# Patient Record
Sex: Female | Born: 1960 | Race: Black or African American | Hispanic: No | Marital: Married | Smoking: Former smoker
Health system: Southern US, Community
[De-identification: ages and names within clinical notes are randomized; demographics above are authoritative.]

## PROBLEM LIST (undated history)

## (undated) ENCOUNTER — Inpatient Hospital Stay (HOSPITAL_COMMUNITY): Payer: Self-pay

## (undated) DIAGNOSIS — N926 Irregular menstruation, unspecified: Secondary | ICD-10-CM

## (undated) DIAGNOSIS — I82409 Acute embolism and thrombosis of unspecified deep veins of unspecified lower extremity: Secondary | ICD-10-CM

## (undated) DIAGNOSIS — N939 Abnormal uterine and vaginal bleeding, unspecified: Secondary | ICD-10-CM

## (undated) DIAGNOSIS — I2699 Other pulmonary embolism without acute cor pulmonale: Secondary | ICD-10-CM

## (undated) DIAGNOSIS — C801 Malignant (primary) neoplasm, unspecified: Secondary | ICD-10-CM

## (undated) DIAGNOSIS — T8859XA Other complications of anesthesia, initial encounter: Secondary | ICD-10-CM

## (undated) DIAGNOSIS — D649 Anemia, unspecified: Secondary | ICD-10-CM

## (undated) DIAGNOSIS — R7303 Prediabetes: Secondary | ICD-10-CM

## (undated) DIAGNOSIS — K649 Unspecified hemorrhoids: Secondary | ICD-10-CM

## (undated) DIAGNOSIS — K219 Gastro-esophageal reflux disease without esophagitis: Secondary | ICD-10-CM

## (undated) HISTORY — DX: Irregular menstruation, unspecified: N92.6

## (undated) HISTORY — PX: OTHER SURGICAL HISTORY: SHX169

## (undated) HISTORY — PX: MYOMECTOMY: SHX85

## (undated) HISTORY — PX: TUBAL LIGATION: SHX77

## (undated) HISTORY — DX: Other pulmonary embolism without acute cor pulmonale: I26.99

## (undated) HISTORY — DX: Unspecified hemorrhoids: K64.9

## (undated) HISTORY — DX: Abnormal uterine and vaginal bleeding, unspecified: N93.9

---

## 2001-02-02 ENCOUNTER — Other Ambulatory Visit: Admission: RE | Admit: 2001-02-02 | Discharge: 2001-02-02 | Payer: Self-pay | Admitting: Internal Medicine

## 2001-10-05 ENCOUNTER — Encounter: Payer: Self-pay | Admitting: Internal Medicine

## 2001-10-06 ENCOUNTER — Encounter: Payer: Self-pay | Admitting: Family Medicine

## 2001-10-06 ENCOUNTER — Encounter: Payer: Self-pay | Admitting: Internal Medicine

## 2001-10-06 ENCOUNTER — Inpatient Hospital Stay (HOSPITAL_COMMUNITY): Admission: AD | Admit: 2001-10-06 | Discharge: 2001-10-07 | Payer: Self-pay | Admitting: Family Medicine

## 2003-11-20 ENCOUNTER — Ambulatory Visit (HOSPITAL_COMMUNITY): Admission: RE | Admit: 2003-11-20 | Discharge: 2003-11-20 | Payer: Self-pay | Admitting: Internal Medicine

## 2003-11-22 ENCOUNTER — Encounter (INDEPENDENT_AMBULATORY_CARE_PROVIDER_SITE_OTHER): Payer: Self-pay | Admitting: *Deleted

## 2003-11-22 ENCOUNTER — Emergency Department (HOSPITAL_COMMUNITY): Admission: EM | Admit: 2003-11-22 | Discharge: 2003-11-22 | Payer: Self-pay | Admitting: Emergency Medicine

## 2004-02-20 ENCOUNTER — Encounter: Payer: Self-pay | Admitting: Internal Medicine

## 2004-04-11 ENCOUNTER — Ambulatory Visit: Payer: Self-pay | Admitting: Internal Medicine

## 2004-04-25 ENCOUNTER — Ambulatory Visit: Payer: Self-pay | Admitting: Internal Medicine

## 2004-05-10 ENCOUNTER — Ambulatory Visit: Payer: Self-pay | Admitting: Internal Medicine

## 2004-06-19 ENCOUNTER — Ambulatory Visit: Payer: Self-pay | Admitting: Internal Medicine

## 2004-06-26 ENCOUNTER — Ambulatory Visit: Payer: Self-pay | Admitting: Internal Medicine

## 2004-07-19 ENCOUNTER — Ambulatory Visit: Payer: Self-pay | Admitting: Internal Medicine

## 2004-07-31 ENCOUNTER — Ambulatory Visit: Payer: Self-pay | Admitting: Internal Medicine

## 2004-08-14 ENCOUNTER — Ambulatory Visit: Payer: Self-pay | Admitting: Internal Medicine

## 2004-08-28 ENCOUNTER — Ambulatory Visit: Payer: Self-pay | Admitting: Internal Medicine

## 2004-10-28 ENCOUNTER — Ambulatory Visit: Payer: Self-pay | Admitting: Internal Medicine

## 2004-11-27 ENCOUNTER — Ambulatory Visit: Payer: Self-pay | Admitting: Internal Medicine

## 2004-12-25 ENCOUNTER — Ambulatory Visit: Payer: Self-pay | Admitting: Internal Medicine

## 2005-01-24 ENCOUNTER — Ambulatory Visit: Payer: Self-pay | Admitting: Internal Medicine

## 2005-03-28 ENCOUNTER — Ambulatory Visit: Payer: Self-pay | Admitting: Internal Medicine

## 2005-04-28 ENCOUNTER — Ambulatory Visit: Payer: Self-pay | Admitting: Internal Medicine

## 2005-06-25 ENCOUNTER — Ambulatory Visit: Payer: Self-pay | Admitting: Internal Medicine

## 2005-10-23 ENCOUNTER — Ambulatory Visit: Payer: Self-pay | Admitting: Internal Medicine

## 2005-11-25 ENCOUNTER — Ambulatory Visit: Payer: Self-pay | Admitting: Internal Medicine

## 2005-12-03 ENCOUNTER — Ambulatory Visit: Payer: Self-pay | Admitting: Internal Medicine

## 2005-12-17 ENCOUNTER — Ambulatory Visit: Payer: Self-pay | Admitting: Internal Medicine

## 2006-01-07 ENCOUNTER — Ambulatory Visit: Payer: Self-pay | Admitting: Internal Medicine

## 2006-01-21 ENCOUNTER — Ambulatory Visit: Payer: Self-pay | Admitting: Internal Medicine

## 2006-02-19 ENCOUNTER — Ambulatory Visit: Payer: Self-pay | Admitting: Internal Medicine

## 2006-03-20 ENCOUNTER — Ambulatory Visit: Payer: Self-pay | Admitting: Internal Medicine

## 2006-04-02 ENCOUNTER — Ambulatory Visit: Payer: Self-pay | Admitting: Internal Medicine

## 2006-04-17 ENCOUNTER — Ambulatory Visit: Payer: Self-pay | Admitting: Internal Medicine

## 2006-05-18 ENCOUNTER — Ambulatory Visit: Payer: Self-pay | Admitting: Internal Medicine

## 2006-06-25 ENCOUNTER — Ambulatory Visit: Payer: Self-pay | Admitting: Internal Medicine

## 2006-07-23 ENCOUNTER — Ambulatory Visit: Payer: Self-pay | Admitting: Internal Medicine

## 2006-11-12 ENCOUNTER — Encounter: Payer: Self-pay | Admitting: Internal Medicine

## 2006-11-12 DIAGNOSIS — D6869 Other thrombophilia: Secondary | ICD-10-CM | POA: Insufficient documentation

## 2006-11-12 DIAGNOSIS — Z86718 Personal history of other venous thrombosis and embolism: Secondary | ICD-10-CM

## 2007-03-08 ENCOUNTER — Telehealth: Payer: Self-pay | Admitting: Internal Medicine

## 2007-03-12 ENCOUNTER — Ambulatory Visit: Payer: Self-pay | Admitting: Internal Medicine

## 2007-03-12 LAB — CONVERTED CEMR LAB: Prothrombin Time: 14.3 s

## 2007-03-26 ENCOUNTER — Ambulatory Visit: Payer: Self-pay | Admitting: Internal Medicine

## 2007-03-26 LAB — CONVERTED CEMR LAB
INR: 3.3
Prothrombin Time: 22 s

## 2007-04-19 ENCOUNTER — Ambulatory Visit: Payer: Self-pay | Admitting: Internal Medicine

## 2007-04-19 DIAGNOSIS — N939 Abnormal uterine and vaginal bleeding, unspecified: Secondary | ICD-10-CM

## 2007-04-19 DIAGNOSIS — N926 Irregular menstruation, unspecified: Secondary | ICD-10-CM

## 2007-04-19 HISTORY — DX: Irregular menstruation, unspecified: N92.6

## 2007-04-20 LAB — CONVERTED CEMR LAB
INR: 5.3 — ABNORMAL HIGH (ref 0.8–1.0)
Prothrombin Time: 29.8 s — ABNORMAL HIGH (ref 10.9–13.3)

## 2007-04-21 ENCOUNTER — Ambulatory Visit: Payer: Self-pay | Admitting: Internal Medicine

## 2007-04-21 LAB — CONVERTED CEMR LAB: INR: 3.7

## 2007-04-30 ENCOUNTER — Ambulatory Visit: Payer: Self-pay | Admitting: Internal Medicine

## 2007-04-30 LAB — CONVERTED CEMR LAB: Prothrombin Time: 20.7 s

## 2007-05-03 ENCOUNTER — Encounter: Payer: Self-pay | Admitting: Internal Medicine

## 2007-05-12 ENCOUNTER — Ambulatory Visit: Payer: Self-pay | Admitting: Internal Medicine

## 2007-05-14 ENCOUNTER — Ambulatory Visit: Payer: Self-pay | Admitting: Internal Medicine

## 2007-05-14 LAB — CONVERTED CEMR LAB: INR: 4.2

## 2007-05-24 ENCOUNTER — Ambulatory Visit: Payer: Self-pay | Admitting: Internal Medicine

## 2007-05-24 LAB — CONVERTED CEMR LAB
INR: 1
Prothrombin Time: 12 s

## 2007-05-28 ENCOUNTER — Ambulatory Visit: Payer: Self-pay | Admitting: Internal Medicine

## 2007-05-28 LAB — CONVERTED CEMR LAB: INR: 1.2

## 2007-06-11 ENCOUNTER — Ambulatory Visit: Payer: Self-pay | Admitting: Internal Medicine

## 2007-06-11 LAB — CONVERTED CEMR LAB: INR: 2.8

## 2007-09-15 ENCOUNTER — Ambulatory Visit: Payer: Self-pay | Admitting: Internal Medicine

## 2007-10-04 ENCOUNTER — Ambulatory Visit: Payer: Self-pay | Admitting: Internal Medicine

## 2007-10-04 ENCOUNTER — Ambulatory Visit: Payer: Self-pay

## 2007-10-04 DIAGNOSIS — R609 Edema, unspecified: Secondary | ICD-10-CM

## 2007-10-04 DIAGNOSIS — R635 Abnormal weight gain: Secondary | ICD-10-CM | POA: Insufficient documentation

## 2007-10-04 LAB — CONVERTED CEMR LAB
Basophils Absolute: 0 10*3/uL (ref 0.0–0.1)
Basophils Relative: 0.8 % (ref 0.0–1.0)
Bilirubin Urine: NEGATIVE
Bilirubin, Direct: 0.1 mg/dL (ref 0.0–0.3)
Calcium: 8.7 mg/dL (ref 8.4–10.5)
Cholesterol: 170 mg/dL (ref 0–200)
Creatinine, Ser: 0.8 mg/dL (ref 0.4–1.2)
Eosinophils Absolute: 0.1 10*3/uL (ref 0.0–0.7)
Free T4: 0.8 ng/dL (ref 0.6–1.6)
GFR calc non Af Amer: 82 mL/min
Glucose, Urine, Semiquant: NEGATIVE
LDL Cholesterol: 99 mg/dL (ref 0–99)
MCHC: 33.9 g/dL (ref 30.0–36.0)
MCV: 78.1 fL (ref 78.0–100.0)
Neutro Abs: 2.1 10*3/uL (ref 1.4–7.7)
Neutrophils Relative %: 59.8 % (ref 43.0–77.0)
RBC: 4.68 M/uL (ref 3.87–5.11)
RDW: 13.5 % (ref 11.5–14.6)
Sodium: 141 meq/L (ref 135–145)
TSH: 0.81 microintl units/mL (ref 0.35–5.50)
Total Bilirubin: 0.6 mg/dL (ref 0.3–1.2)
Triglycerides: 136 mg/dL (ref 0–149)
Urobilinogen, UA: 0.2
VLDL: 27 mg/dL (ref 0–40)
pH: 5.5

## 2007-10-07 ENCOUNTER — Ambulatory Visit: Payer: Self-pay | Admitting: Internal Medicine

## 2007-10-07 LAB — CONVERTED CEMR LAB
INR: 2
Prothrombin Time: 17.5 s

## 2007-10-12 ENCOUNTER — Encounter: Payer: Self-pay | Admitting: Internal Medicine

## 2007-10-21 ENCOUNTER — Ambulatory Visit: Payer: Self-pay | Admitting: Internal Medicine

## 2007-10-21 LAB — CONVERTED CEMR LAB

## 2007-11-11 ENCOUNTER — Ambulatory Visit: Payer: Self-pay | Admitting: Internal Medicine

## 2007-11-11 DIAGNOSIS — N946 Dysmenorrhea, unspecified: Secondary | ICD-10-CM

## 2008-01-18 ENCOUNTER — Ambulatory Visit: Payer: Self-pay | Admitting: Internal Medicine

## 2008-08-30 ENCOUNTER — Telehealth: Payer: Self-pay | Admitting: *Deleted

## 2008-08-31 ENCOUNTER — Ambulatory Visit: Payer: Self-pay | Admitting: Internal Medicine

## 2008-09-04 ENCOUNTER — Telehealth: Payer: Self-pay | Admitting: *Deleted

## 2008-09-14 ENCOUNTER — Ambulatory Visit: Payer: Self-pay | Admitting: Internal Medicine

## 2008-09-14 LAB — CONVERTED CEMR LAB
INR: 8
Prothrombin Time: 33.9 s

## 2008-09-28 ENCOUNTER — Ambulatory Visit: Payer: Self-pay | Admitting: Internal Medicine

## 2008-09-28 LAB — CONVERTED CEMR LAB
INR: 2.6
Prothrombin Time: 19.7 s

## 2008-10-26 ENCOUNTER — Ambulatory Visit: Payer: Self-pay | Admitting: Internal Medicine

## 2008-10-26 LAB — CONVERTED CEMR LAB: Prothrombin Time: 22.2 s

## 2008-12-26 ENCOUNTER — Ambulatory Visit: Payer: Self-pay | Admitting: Internal Medicine

## 2008-12-26 DIAGNOSIS — K649 Unspecified hemorrhoids: Secondary | ICD-10-CM

## 2008-12-26 HISTORY — DX: Unspecified hemorrhoids: K64.9

## 2009-01-11 ENCOUNTER — Telehealth: Payer: Self-pay | Admitting: *Deleted

## 2009-01-22 ENCOUNTER — Encounter: Payer: Self-pay | Admitting: Internal Medicine

## 2009-01-23 ENCOUNTER — Encounter: Payer: Self-pay | Admitting: Internal Medicine

## 2009-02-02 ENCOUNTER — Ambulatory Visit: Payer: Self-pay | Admitting: Internal Medicine

## 2009-03-27 ENCOUNTER — Ambulatory Visit: Payer: Self-pay | Admitting: Internal Medicine

## 2009-03-30 ENCOUNTER — Ambulatory Visit: Payer: Self-pay | Admitting: Internal Medicine

## 2009-03-30 DIAGNOSIS — M25569 Pain in unspecified knee: Secondary | ICD-10-CM

## 2009-03-30 LAB — CONVERTED CEMR LAB: Prothrombin Time: 17.3 s

## 2009-04-06 ENCOUNTER — Ambulatory Visit: Payer: Self-pay | Admitting: Internal Medicine

## 2009-04-06 LAB — CONVERTED CEMR LAB: POC INR: 2.3

## 2009-04-25 ENCOUNTER — Ambulatory Visit: Payer: Self-pay | Admitting: Internal Medicine

## 2009-05-01 ENCOUNTER — Ambulatory Visit: Payer: Self-pay | Admitting: Internal Medicine

## 2009-05-01 LAB — CONVERTED CEMR LAB: POC INR: 2.7

## 2009-05-04 ENCOUNTER — Encounter: Payer: Self-pay | Admitting: Internal Medicine

## 2009-06-12 ENCOUNTER — Telehealth: Payer: Self-pay | Admitting: *Deleted

## 2009-06-22 ENCOUNTER — Ambulatory Visit: Payer: Self-pay | Admitting: Internal Medicine

## 2009-07-20 ENCOUNTER — Ambulatory Visit: Payer: Self-pay | Admitting: Internal Medicine

## 2009-08-15 ENCOUNTER — Telehealth: Payer: Self-pay | Admitting: Internal Medicine

## 2009-08-17 ENCOUNTER — Ambulatory Visit: Payer: Self-pay | Admitting: Cardiology

## 2009-09-13 ENCOUNTER — Ambulatory Visit: Payer: Self-pay | Admitting: Internal Medicine

## 2009-09-13 LAB — CONVERTED CEMR LAB: POC INR: 2.8

## 2009-10-11 ENCOUNTER — Ambulatory Visit: Payer: Self-pay | Admitting: Internal Medicine

## 2009-10-11 LAB — CONVERTED CEMR LAB
INR: 7.2 (ref 0.8–1.0)
Prothrombin Time: 73.2 s
Prothrombin Time: 73.2 s (ref 9.1–11.7)

## 2009-10-15 ENCOUNTER — Ambulatory Visit: Payer: Self-pay | Admitting: Cardiology

## 2009-10-25 ENCOUNTER — Ambulatory Visit: Payer: Self-pay | Admitting: Internal Medicine

## 2009-11-09 ENCOUNTER — Ambulatory Visit: Payer: Self-pay | Admitting: Cardiology

## 2009-11-09 LAB — CONVERTED CEMR LAB: POC INR: 1.8

## 2009-11-23 ENCOUNTER — Ambulatory Visit: Payer: Self-pay | Admitting: Internal Medicine

## 2009-12-11 ENCOUNTER — Ambulatory Visit: Payer: Self-pay | Admitting: Cardiology

## 2009-12-25 ENCOUNTER — Ambulatory Visit: Payer: Self-pay | Admitting: Cardiology

## 2010-01-15 ENCOUNTER — Ambulatory Visit: Payer: Self-pay | Admitting: Cardiovascular Disease

## 2010-01-15 LAB — CONVERTED CEMR LAB: POC INR: 3.5

## 2010-02-12 ENCOUNTER — Ambulatory Visit: Payer: Self-pay | Admitting: Cardiology

## 2010-03-05 ENCOUNTER — Ambulatory Visit: Payer: Self-pay | Admitting: Internal Medicine

## 2010-03-05 LAB — CONVERTED CEMR LAB: POC INR: 1.9

## 2010-04-02 ENCOUNTER — Ambulatory Visit: Payer: Self-pay | Admitting: Cardiology

## 2010-04-02 LAB — CONVERTED CEMR LAB: POC INR: 5.7

## 2010-04-09 ENCOUNTER — Encounter: Admission: RE | Admit: 2010-04-09 | Discharge: 2010-04-09 | Payer: Self-pay | Admitting: Internal Medicine

## 2010-04-16 ENCOUNTER — Ambulatory Visit: Payer: Self-pay | Admitting: Cardiology

## 2010-04-16 LAB — CONVERTED CEMR LAB: POC INR: 2.3

## 2010-05-04 ENCOUNTER — Emergency Department (HOSPITAL_COMMUNITY)
Admission: EM | Admit: 2010-05-04 | Discharge: 2010-05-04 | Payer: Self-pay | Source: Home / Self Care | Admitting: Emergency Medicine

## 2010-05-23 ENCOUNTER — Telehealth: Payer: Self-pay | Admitting: Internal Medicine

## 2010-06-05 ENCOUNTER — Ambulatory Visit: Admission: RE | Admit: 2010-06-05 | Discharge: 2010-06-05 | Payer: Self-pay | Source: Home / Self Care

## 2010-06-05 LAB — CONVERTED CEMR LAB: POC INR: 2.3

## 2010-06-24 ENCOUNTER — Ambulatory Visit: Admit: 2010-06-24 | Payer: Self-pay

## 2010-06-27 NOTE — Medication Information (Signed)
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 no    Recent/future dental: no  Any missed doses?: yes     Details: missed one dose on Monday and tuesday of last week.  Is patient compliant with meds? yes       Allergies: 1)  Cleocin (Clindamycin Hcl)  Anticoagulation Management History:      The patient is taking warfarin and comes in today for a routine follow up visit.  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  Negative risk factors for bleeding include an age less than 51 years old.  The bleeding index is 'low risk'.  Negative CHADS2 values include Age > 51 years old.  Plans are to continue warfarin for life.  Her last INR was 7.2 ratio.  Anticoagulation responsible provider: Bensimhon MD, Reuel Boom.  INR POC: 1.9.  Cuvette Lot#: [id number].  Exp: 03/2011.    Anticoagulation Management Assessment/Plan:      The patient's current anticoagulation dose is Coumadin 5 mg  tabs: take as directed  brand only.  The target INR is 2.5-3.5.  The next INR is due 04/02/2010.  Anticoagulation instructions were given to patient.  Results were reviewed/authorized by Weston Brass, PharmD.  She was notified by Ilean Skill D candidate.         Prior Anticoagulation Instructions: INR 3.9  Today, take only 1 tablet. Then resume regular dose of 2 1/2 tablets everyday except 2 tablets on Sunday and Thursday. Re-check INR in 3 weeks.   Current Anticoagulation  Instructions: INR 1.9  Take 3 tablets today, then continue taking same dose of 2 1/2 tablets  everyday except 2 tablets on Sunday and Thursday. Recheck in 4 weeks.

## 2010-06-27 NOTE — Medication Information (Signed)
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 no    Recent/future dental: no  Any missed doses?: yes     Details: missed one dose on last Thursday 7/14  Is patient compliant with meds? yes       Allergies: 1)  Cleocin (Clindamycin Hcl)  Anticoagulation Management History:      The patient is taking warfarin and comes in today for a routine follow up visit.  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  Negative risk factors for bleeding include an age less than 63 years old.  The bleeding index is 'low risk'.  Negative CHADS2 values include Age > 41 years old.  Plans are to continue warfarin for life.  Her last INR was 7.2 ratio.  Anticoagulation responsible provider: Jens Som MD, Arlys John.  INR POC: 2.7.  Cuvette Lot#: [id number].  Exp: 02/2011.    Anticoagulation Management Assessment/Plan:      The patient's current anticoagulation dose is Coumadin 5 mg  tabs: take as directed  brand only.  The target INR is 2.5-3.5.  The next INR is due 12/25/2009.  Anticoagulation instructions were given to patient.  Results were reviewed/authorized by Weston Brass, PharmD.  She was notified by Dillard Cannon.         Prior Anticoagulation Instructions: INR 4.0  Skip today's dosage, then resume same dosage 2.5 tablets daily except 2 tablets on Sundays.  Recheck in 2 weeks.    Current Anticoagulation Instructions: INR 2.7  Change to 2 tabs on Sunday and Thursday and 2.5  tabs all other days.  Re-check in 2 weeks.

## 2010-06-27 NOTE — Medication Information (Signed)
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 no    Recent/future dental: no  Any missed doses?: no       Is patient compliant with meds? yes      Comments: Pt states she forgot to take 2 tablets on Sunday, actually took 2.5 tablets.  Allergies: 1)  Cleocin (Clindamycin Hcl)  Anticoagulation Management History:      The patient is taking warfarin and comes in today for a routine follow up visit.  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  Negative risk factors for bleeding include an age less than 30 years old.  The bleeding index is 'low risk'.  Negative CHADS2 values include Age > 79 years old.  Plans are to continue warfarin for life.  Her last INR was 7.2 ratio.  Anticoagulation responsible provider: Tenny Craw MD, Gunnar Fusi.  INR POC: 4.0.  Cuvette Lot#: [id number].  Exp: 01/2011.    Anticoagulation Management Assessment/Plan:      The patient's current anticoagulation dose is Coumadin 5 mg  tabs: take as directed  brand only.  The target INR is 2.5-3.5.  The next INR is due 12/11/2009.  Anticoagulation instructions were given to patient.  Results were reviewed/authorized by Cloyde Reams, RN, BSN.  She was notified by Cloyde Reams RN.         Prior Anticoagulation Instructions: INR 1.8  Take 3 tablets today and tomorrow then resume same dose of 2 1/2 tablets every day except 2 tablets on Sunday.  Recheck in 2 weeks.   Current Anticoagulation Instructions: INR  4.0  Skip today's dosage, then resume same dosage 2.5 tablets daily except 2 tablets on Sundays.  Recheck in 2 weeks.

## 2010-06-27 NOTE — Medication Information (Signed)
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 no    Recent/future dental: no  Any missed doses?: yes     Details: Missed 3 doses ( 2 doses last week and 1 last night)  Is patient compliant with meds? yes       Allergies (verified): 1)  Cleocin (Clindamycin Hcl)  Anticoagulation Management History:      The patient is taking warfarin and comes in today for a routine follow up visit.  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  Negative risk factors for bleeding include an age less than 11 years old.  The bleeding index is 'low risk'.  Negative CHADS2 values include Age > 71 years old.  Plans are to continue warfarin for life.  Her last INR was 2.0.  Anticoagulation responsible provider: Tenny Craw MD, Gunnar Fusi.  INR POC: 3.1.  Cuvette Lot#: [id number].  Exp: 08/2010.    Anticoagulation Management Assessment/Plan:      The patient's current anticoagulation dose is Coumadin 5 mg  tabs: take as directed  brand only.  The target INR is 2.5-3.5.  The next INR is due 07/20/2009.  Anticoagulation instructions were given to patient.  Results were reviewed/authorized by Ysidro Evert, Pharm D Candidate.  She was notified by Ysidro Evert, Pharm D Candidate.         Prior Anticoagulation Instructions: INR 2.7 Resume 12.5mg s daily. Recheck in one week.   Current Anticoagulation Instructions: INR: 3.1 Continue with same dosage of 2.5tablets every  day Recheck in 4 weeks

## 2010-06-27 NOTE — Progress Notes (Signed)
Summary: ED Visit from 05/04/10   Theresa, Lewis MRN: [id number] Acct#: [id number] PHYSICIAN DOCUMENTATION SHEET Wed Dec 14 16:31:05 EST 2011 [facility] [address] [place], [place] [address] PHONE: [phone] MRN: [id number] Account #: [id number] Name: Theresa Lewis, Theresa Lewis Sex: F Age: 50 DOB: Jul 23, 1960 Complaint: Dental pain Primary Diagnosis: Dental pain Arrival Time: 05/04/2010 03:35 Discharge Time: 05/04/2010 05:55 All Providers: Ms. Burgess Amor - PA; Dr. Doug Sou - MD; Dr. Leonette Most "[place]" Bernette Mayers - MD Deloria Brassfield: Ms. Burgess Amor - PA HPI: The patient is a 50 year old female who presents with a chief complaint of dental pain. The history was provided by the patient. She had a filling replaced in her right lateral incisor tooth one month ago. Three days ago she developed pain in this tooth. She denies injury to the tooth. The onset was gradual. The Pattern is persistent. The patient's pain was 7 out of 10 at its worst. The patient's pain is 7 out of 10 now. The symptoms are described as moderate to severe. The condition is aggravated by nothing. The condition is relieved by nothing. The symptoms have been associated with no other complaints. The patient was treated prior to ED evaluation by herself ,today, with acetaminophen and ibuprofen resulting in no relief. The patient has no significant history of serious medical conditions. 05:42 05/04/2010 by Burgess Amor - PA, Ms. ROS: Statement: all systems negative except as marked or noted in the HPI 05:42 05/04/2010 by Burgess Amor - PA, Ms. 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 Home Medications: Documentation: physician assistant reviewed/amended Medications Medication [Medication] Dosage Frequency Last Dose Coumadin Oral 12.5 mg every day except for 2 days-10 mg once a day ibuprofen Oral as needed 05:37 05/04/2010 by Burgess Amor - PA, Ms. Physical examination: Vital signs and O2 SAT: reviewed, rechecked Constitutional: well developed, well nourished, well hydrated, in no acute distress, awake, alert, oriented Head and Face: atraumatic, normocephalic Eyes: normal appearance ENMT: left TM normal, right TM normal, normal voice NOTE - Normal dental and mouth exam. No edema, erythema, no cracks, no trauma noted to the right lateral incisor. ENMT Exam: Teeth Findings Permanent tooth Deciduous tooth abscessed, decay 31 - right lower second molar Neck: supple, full range of motion, no meningeal signs Cardiovascular: regular rate and rhythm Respiratory: normal, breath sounds equal bilaterally Chest: movement normal, movement symmetrical, no chest deformity Extremities: no abnormalities, full range of motion Neuro: normal speech Skin: color normal, no rash, no petechiae, warm, dry Psychiatric: no abnormalities of mood or affect, alert and oriented person, place, and time 05:42 05/04/2010 by Burgess Amor - PA, Ms. Medication disposition: 2 Rosaisela, Jamroz MRN: [id number] Acct#: [id number] Medications Medication [Medication] Dosage Frequency Last Dose Medication disposition PCP contact Coumadin Oral 12.5 mg every day except for 2 days-10 mg once a day continue ibuprofen Oral as needed continue 05:43 05/04/2010 by Burgess Amor - PA, Ms. Prescriptions: Prescription Medication Dispense Sig Line Percocet 5 mg-325 mg Tab Twenty (20) One-two every six  hours as needed for pain. Drug interactions: Drug interactions  Moderate Coumadin Oral Percocet Oral SELECTED ANTICOAGULANTS/ ACETAMINOPHEN 05:43 05/04/2010 by Burgess Amor - PA, Ms. Patient disposition: Patient disposition: Disch - Home Primary Diagnosis: Dental pain Counseling: advised of diagnosis, advised of treatment plan 05:43 05/04/2010 by Burgess Amor - PA, Ms. Discharge: Discharge Instructions: toothache Append a Note to Discharge Instructions: Call your dentist on Monday. Use the perocet if needed for pain - this medicine will make you drowsy - do not drive within 4 hours of taking. Referral/Appointment Refer Patient To: Phone Number: Follow-up in Appointment Details: *Non-[facility] PCP, Per pt./family/ records Drug Instructions: pain acetaminophen hydrocodone 05:44 05/04/2010 by Burgess Amor - PA, Ms. [address] [place], [place] - MRN: [id number] Acct#: [id number] Documentation completed by Responsible Physician 05:44 05/04/2010 by Burgess Amor - PA, Ms. Cyan Moultrie: Dr. Doug Sou - MD Chart electronically signed by ER Physician 08:04 05/04/2010 by Doug Sou - MD, Dr. Libby Maw orders: Verify orders: verify all orders 08:03 05/04/2010 by Doug Sou - MD, Dr. Attending: Supervision of: Midlevel: evaluation and management procedures were performed by the PA/NP/CNM under my supervision/collaboration. 08:04 05/04/2010 by Doug Sou - MD, Dr. Phoebie Shad: Dr. Leonette Most "[place]" Bernette Mayers - MD Chart electronically signed 09:55 05/04/2010 by Charles "[place]" Sheldon - MD, Dr. ED Course: Comments: Percocet prescription was not signed. My involvement was limited to reprinting the Rx. I did not see the patient. 09:55 05/04/2010 by Charles "[place]" Sheldon - MD, Dr. REVIEWER: Karleen Hampshire - Reviewer Review completed: Documentation completed 16:31 05/08/2010 by Karleen Hampshire - Reviewer 4

## 2010-06-27 NOTE — Medication Information (Signed)
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 no    Recent/future dental: no  Any missed doses?: no       Is patient compliant with meds? yes       Allergies: 1)  Cleocin (Clindamycin Hcl)  Anticoagulation Management History:      The patient is taking warfarin and comes in today for a routine follow up visit.  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  Negative risk factors for bleeding include an age less than 92 years old.  The bleeding index is 'low risk'.  Negative CHADS2 values include Age > 97 years old.  Plans are to continue warfarin for life.  Her last INR was 7.2 ratio.  Anticoagulation responsible provider: Patty Sermons, MD.  INR POC: 2.3.  Cuvette Lot#: [id number].  Exp: 06/2011.    Anticoagulation Management Assessment/Plan:      The patient's current anticoagulation dose is Coumadin 5 mg  tabs: take as directed  brand only.  The target INR is 2.5-3.5.  The next INR is due 06/24/2010.  Anticoagulation instructions were given to patient.  Results were reviewed/authorized by Weston Brass, PharmD.  She was notified by Stephannie Peters, PharmD Candidate .         Prior Anticoagulation Instructions: INR 2.3 Take 3 tablets today then resume previous dose of 2.5 tablets everyday except 2 tablets on Sunday and Thursday Recheck INR in 2 weeks  Current Anticoagulation Instructions: INR 2.3  Coumadin 5 mg tablets - Take 3 tablets today, then resume  taking 2.5 tablets every day except 2 tablets on Sundays and Thursdays  Prescriptions: COUMADIN 5 MG  TABS (WARFARIN SODIUM) take as directed  brand only Brand medically necessary #100 Tablet x 1   Entered by:   Sally Putt PharmD   Authorized by:   Dalton McLean, MD   Signed by:   Sally Putt PharmD on 06/05/2010   Method used:   [facility]        [facility] [address] [id number]       [address] [place], [place]  [address]       Ph: [phone]       Fax: [phone]   RxID:   [id number]

## 2010-06-27 NOTE — Medication Information (Signed)
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    Recent/future hospitalizations: no    Any changes in medication regimen? no    Recent/future dental: no  Any missed doses?: no       Is patient compliant with meds? yes       Allergies (verified): 1)  Cleocin (Clindamycin Hcl)  Anticoagulation Management History:      The patient is taking warfarin and comes in today for a routine follow up visit.  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  Negative risk factors for bleeding include an age less than 6 years old.  The bleeding index is 'low risk'.  Negative CHADS2 values include Age > 3 years old.  Plans are to continue warfarin for life.  Her last INR was 2.0.  Anticoagulation responsible provider: Jens Som MD, Arlys John.  INR POC: 2.2.  Cuvette Lot#: [id number].  Exp: 09/2010.    Anticoagulation Management Assessment/Plan:      The patient's current anticoagulation dose is Coumadin 5 mg  tabs: take as directed  brand only.  The target INR is 2.5-3.5.  The next INR is due 09/14/2009.  Anticoagulation instructions were given to patient.  Results were reviewed/authorized by Cloyde Reams, RN, BSN.  She was notified by Cloyde Reams RN.         Prior Anticoagulation Instructions: INR 3.7  Take 1/2 tablet today then resume same dosage 2.5 tablets daily.  Recheck in 4 weeks.    Current Anticoagulation Instructions: INR 2.2  Take 3 tablets today then resume  same dosage 2.5 tablets daily.  Recheck in 3-4 weeks.

## 2010-06-27 NOTE — Progress Notes (Signed)
Summary: refill Coumadin  Phone Note Call from Patient   Caller: Patient Call For: Theresa Headings MD Summary of Call: Pt goes to [facility] and is on 12.5 mg. of Coumadin daily and needs new prescription with correction [facility] [phone] Initial call taken by: Lynann Beaver CMA,  June 12, 2009 2:14 PM  Follow-up for Phone Call        Spoke to pt and she is going to follow up with coumadin clinic. She wasn't aware that she needed make an appt. She states that she will go ahead and call them to make a follow up appt. I went ahead and refilled the coumadin this time but pt is aware that if the coumadin clinic is following her protime, they need to refill her medication from now on so they can adjust her dosage. Follow-up by: Romualdo Bolk, CMA (AAMA),  June 12, 2009 5:02 PM    Prescriptions: COUMADIN 5 MG  TABS (WARFARIN SODIUM) take as directed  brand only Brand medically necessary #100 Each x 0   Entered by:   Romualdo Bolk, CMA (AAMA)   Authorized by:   Theresa Headings MD   Signed by:   Romualdo Bolk, CMA (AAMA) on 06/12/2009   Method used:   [facility]        [id number]       [address] [place], [place]  [address]       Ph: [phone]       Fax: [phone]   RxID:   [id number]

## 2010-06-27 NOTE — Medication Information (Signed)
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 no    Recent/future dental: no  Any missed doses?: yes     Details: Missed 1 dose on Tuesday.    Is patient compliant with meds? yes       Allergies: 1)  Cleocin (Clindamycin Hcl)  Anticoagulation Management History:      The patient is taking warfarin and comes in today for a routine follow up visit.  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  Negative risk factors for bleeding include an age less than 65 years old.  The bleeding index is 'low risk'.  Negative CHADS2 values include Age > 8 years old.  Plans are to continue warfarin for life.  Her last INR was 2.0.  Anticoagulation responsible provider: Jens Som MD, Arlys John.  INR POC: 2.8.  Cuvette Lot#: [id number].  Exp: 10/2010.    Anticoagulation Management Assessment/Plan:      The patient's current anticoagulation dose is Coumadin 5 mg  tabs: take as directed  brand only.  The target INR is 2.5-3.5.  The next INR is due 10/11/2009.  Anticoagulation instructions were given to patient.  Results were reviewed/authorized by Cloyde Reams, RN, BSN.  She was notified by Cloyde Reams RN.         Prior Anticoagulation Instructions: INR 2.2  Take 3 tablets today then resume same dosage 2.5 tablets daily.  Recheck in 3-4 weeks.     Current Anticoagulation Instructions: INR 2.8  Continue on same dosage 2.5 tablets daily.  Recheck in 4 weeks.

## 2010-06-27 NOTE — Medication Information (Signed)
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 no    Recent/future dental: no  Any missed doses?: no       Is patient compliant with meds? yes       Allergies: 1)  Cleocin (Clindamycin Hcl)  Anticoagulation Management History:      The patient is taking warfarin and comes in today for a routine follow up visit.  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  Negative risk factors for bleeding include an age less than 90 years old.  The bleeding index is 'low risk'.  Negative CHADS2 values include Age > 64 years old.  Plans are to continue warfarin for life.  Her last INR was 7.2 ratio.  Anticoagulation responsible Pamela Maddy: Shirlee Latch MD, Dalton.  INR POC: 2.3.  Cuvette Lot#: [id number].  Exp: 04/2011.    Anticoagulation Management Assessment/Plan:      The patient's current anticoagulation dose is Coumadin 5 mg  tabs: take as directed  brand only.  The target INR is 2.5-3.5.  The next INR is due 04/30/2010.  Anticoagulation instructions were given to patient.  Results were reviewed/authorized by Weston Brass, PharmD.  She was notified by Hoy Register, PharmD Candidate.         Prior Anticoagulation Instructions: INR 5.7  No Coumadin today, Tuesday, November 8th. Take Coumadin 1 tab (5 mg) on Wednesday, November 9th. Then resume taking Coumadin 2 tabs (10 mg) on Sundays and Thursdays and 2.5 tabs (12.5 mg) all other days.    Return to clinic in 2 weeks.  Return to clinic   Current Anticoagulation Instructions: INR 2.3 Take 3 tablets today then resume previous dose of 2.5 tablets everyday except 2 tablets on Sunday and Thursday Recheck INR in 2 weeks

## 2010-06-27 NOTE — Medication Information (Signed)
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 no    Recent/future dental: no  Any missed doses?: yes     Details: one dose missed  ~3 weeks ago  Is patient compliant with meds? yes       Allergies: 1)  Cleocin (Clindamycin Hcl)  Anticoagulation Management History:      The patient is taking warfarin and comes in today for a routine follow up visit.  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  Negative risk factors for bleeding include an age less than 51 years old.  The bleeding index is 'low risk'.  Negative CHADS2 values include Age > 51 years old.  Plans are to continue warfarin for life.  Her last INR was 2.0.  Prothrombin time is 73.2.  Anticoagulation responsible provider: Ladona Ridgel MD, Sharlot Gowda.  INR POC: 7.3.  Cuvette Lot#: [id number].  Exp: 12/2010.    Anticoagulation Management Assessment/Plan:      The patient's current anticoagulation dose is Coumadin 5 mg  tabs: take as directed  brand only.  The target INR is 2.5-3.5.  The next INR is due 10/15/2009.  Anticoagulation instructions were given to patient.  Results were reviewed/authorized by Eda Keys, PharmD.  She was notified by Eda Keys.         Prior Anticoagulation Instructions: INR 2.8  Continue on same dosage 2.5 tablets daily.  Recheck in 4 weeks.    Current Anticoagulation Instructions: INR 7.3  Labwork back at 4:30 pm.  Patient to continue holding coumadin until  Monday, when she will follow-up in coumadin clinic.  Patient notified of results via telephone on 10/11/09 @ 4:30 pm.

## 2010-06-27 NOTE — Medication Information (Signed)
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 no    Recent/future dental: no  Any missed doses?: no       Is patient compliant with meds? yes       Allergies: 1)  Cleocin (Clindamycin Hcl)  Anticoagulation Management History:      The patient is taking warfarin and comes in today for a routine follow up visit.  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  Negative risk factors for bleeding include an age less than 51 years old.  The bleeding index is 'low risk'.  Negative CHADS2 values include Age > 51 years old.  Plans are to continue warfarin for life.  Her last INR was 7.2 ratio.  Anticoagulation responsible provider: Eden Emms MD, Theron Arista.  INR POC: 3.5.  Cuvette Lot#: [id number].  Exp: 02/2011.    Anticoagulation Management Assessment/Plan:      The patient's current anticoagulation dose is Coumadin 5 mg  tabs: take as directed  brand only.  The target INR is 2.5-3.5.  The next INR is due 02/12/2010.  Anticoagulation instructions were given to patient.  Results were reviewed/authorized by Weston Brass, PharmD.  She was notified by Gweneth Fritter, PharmD Candidate.         Prior Anticoagulation Instructions: INR 3.1  Continue same dose of 2 1/2 tablets every day except 2 tablets on Sunday and Thursday. Recheck INR in 3 weeks.   Current Anticoagulation Instructions: INR 3.5  Continue taking 2.5 tablets (12.5mg ) every day except take 2 tablets (10mg ) on Sundays and  Thursdays.  Recheck in 4 weeks.

## 2010-06-27 NOTE — Medication Information (Signed)
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 no    Recent/future dental: no  Any missed doses?: no       Is patient compliant with meds? yes       Allergies: 1)  Cleocin (Clindamycin Hcl)  Anticoagulation Management History:      The patient is taking warfarin and comes in today for a routine follow up visit.  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  Negative risk factors for bleeding include an age less than 10 years old.  The bleeding index is 'low risk'.  Negative CHADS2 values include Age > 42 years old.  Plans are to continue warfarin for life.  Her last INR was 7.2 ratio.  Anticoagulation responsible provider: Jens Som MD, Arlys John.  INR POC: 3.1.  Cuvette Lot#: [id number].  Exp: 02/2011.    Anticoagulation Management Assessment/Plan:      The patient's current anticoagulation dose is Coumadin 5 mg  tabs: take as directed  brand only.  The target INR is 2.5-3.5.  The next INR is due 01/15/2010.  Anticoagulation instructions were given to patient.  Results were reviewed/authorized by Weston Brass, PharmD.  She was notified by Weston Brass PharmD.         Prior Anticoagulation Instructions: INR 2.7  Change to 2 tabs on Sunday and Thursday and 2.5 tabs all other days.  Re-check in 2 weeks.   Current Anticoagulation Instructions: INR 3.1  Continue same dose of 2 1/2 tablets every day except 2 tablets on Sunday and Thursday. Recheck INR in 3 weeks.

## 2010-06-27 NOTE — Progress Notes (Signed)
Summary: TRIAGE: Bright Red Blood w/BM's  Medications Added ANUCORT-HC 25 MG SUPP (HYDROCORTISONE ACETATE) Put 1 in rectum 2 times daily for 7 days. COLACE 100 MG CAPS (DOCUSATE SODIUM) Take 1 at bedtime every night as needed       Phone Note Call from Patient Call back at [facility] Phone [phone] Call back at Work Phone [phone]   Call For: Dr Juanda Chance Reason for Call: Talk to Nurse Summary of Call: Bright red rectal bleeding with Bm's and when she wipes its bright red blood on tissue.  Initial call taken by: Leanor Kail [facility],  August 15, 2009 8:27 AM  Follow-up for Phone Call        Last OV 03-27-09, last Colon 04-25-09. This episode of rectal bleeding has been for 3 days, had an episode 4 weeks ago that lasted 1 week, used the Analpram cream and the bleeding finally subsided. Rectal itching, occ. hard stool. Denies rectal pain,burning,constipation, diarrhea, fever,n/v.   1) Anusol HC supp. 1 pr BID x 7 days,sitz bath TID,Tucks pads to rectum TID,use baby wipes instead of toilet paper. 2) May use Colace 100mg  at bedtime as needed 3) See Dr.Rainn Bullinger on 08-21-09 at 10:30am 4) If symptoms become worse call back immediately.  Follow-up by: Laureen Ochs LPN,  August 15, 2009 9:10 AM  Additional Follow-up for Phone Call Additional follow up Details #1::        reviewed and agree. Additional Follow-up by: Hart Carwin MD,  August 15, 2009 12:59 PM    New/Updated Medications: ANUCORT-HC 25 MG SUPP (HYDROCORTISONE ACETATE) Put 1 in rectum 2 times daily for 7 days. COLACE 100 MG CAPS (DOCUSATE SODIUM) Take 1 at bedtime every night as needed Prescriptions: COLACE 100 MG CAPS (DOCUSATE SODIUM) Take 1 at bedtime every night as needed  #30 x 3   Entered by:   Laureen Ochs LPN   Authorized by:   Hart Carwin MD   Signed by:   Laureen Ochs LPN on 24/40/1027   Method used:   Electronically to        [facility] # [phone]* (retail)       [address] [place],  [place]  [address]       Ph: [phone]       Fax: [phone]   RxID:   [id number] ANUCORT-HC 25 MG SUPP (HYDROCORTISONE ACETATE) Put 1 in rectum 2 times daily for 7 days.  #7 x 1   Entered by:   Laureen Ochs LPN   Authorized by:   Hart Carwin MD   Signed by:   Laureen Ochs LPN on 32/35/5732   Method used:   Electronically to        [facility] # [phone]* (retail)       [address] [place], [place]  [address]       Ph: [phone]       Fax: [phone]   RxID:   [id number]

## 2010-06-27 NOTE — Medication Information (Signed)
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 no    Recent/future dental: no  Any missed doses?: no       Is patient compliant with meds? yes       Allergies: 1)  Cleocin (Clindamycin Hcl)  Anticoagulation Management History:      The patient is taking warfarin and comes in today for a routine follow up visit.  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  Negative risk factors for bleeding include an age less than 50 years old.  The bleeding index is 'low risk'.  Negative CHADS2 values include Age > 106 years old.  Plans are to continue warfarin for life.  Her last INR was 7.2 ratio.  Anticoagulation responsible provider: Bensimhon MD, Reuel Boom.  INR POC: 4.5.  Cuvette Lot#: [id number].  Exp: 12/2010.    Anticoagulation Management Assessment/Plan:      The patient's current anticoagulation dose is Coumadin 5 mg  tabs: take as directed  brand only.  The target INR is 2.5-3.5.  The next INR is due 11/09/2009.  Anticoagulation instructions were given to patient.  Results were reviewed/authorized by Bethena Midget, RN, BSN.  She was notified by Bethena Midget, RN, BSN.         Prior Anticoagulation Instructions: INR 1.7  Take 3 tablets today then resume same dose of 2 1/2 tablets every day   Current Anticoagulation Instructions: INR 4.5 Skip today Then change dose to 12.5mg s daily except 10mg s on Sundays. Recheck in 2 weeks.

## 2010-06-27 NOTE — Medication Information (Signed)
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 no    Recent/future dental: no  Any missed doses?: yes     Details: held dose since last Thursday after INR >7  Is patient compliant with meds? yes       Allergies: 1)  Cleocin (Clindamycin Hcl)  Anticoagulation Management History:      The patient is taking warfarin and comes in today for a routine follow up visit.  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  Negative risk factors for bleeding include an age less than 52 years old.  The bleeding index is 'low risk'.  Negative CHADS2 values include Age > 67 years old.  Plans are to continue warfarin for life.  Her last INR was 7.2 ratio.  Anticoagulation responsible provider: Myrtis Ser MD, Tinnie Gens.  INR POC: 1.7.  Cuvette Lot#: [id number].  Exp: 12/2010.    Anticoagulation Management Assessment/Plan:      The patient's current anticoagulation dose is Coumadin 5 mg  tabs: take as directed  brand only.  The target INR is 2.5-3.5.  The next INR is due 10/25/2009.  Anticoagulation instructions were given to patient.  Results were reviewed/authorized by Weston Brass, PharmD.  She was notified by Weston Brass PharmD.         Prior Anticoagulation Instructions: INR 7.3  Labwork back at 4:30 pm.  Patient to continue holding coumadin until Monday, when she will follow-up in coumadin clinic.  Patient notified of results via telephone on 10/11/09 @ 4:30 pm.      Current Anticoagulation  Instructions: INR 1.7  Take 3 tablets today then resume same dose of 2 1/2 tablets every day

## 2010-06-27 NOTE — Medication Information (Signed)
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  Resolved before calling MD.  Pt will continue to monitor and call GI MD if reoccurs.    Recent/future hospitalizations: no    Any changes in medication regimen? no    Recent/future dental: no  Any missed doses?: no       Is patient compliant with meds? yes       Allergies (verified): 1)  Cleocin (Clindamycin Hcl)  Anticoagulation Management History:      The patient is taking warfarin and comes in today for a routine follow up visit.  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  Negative risk factors for bleeding include an age less than 32 years old.  The bleeding index is 'low risk'.  Negative CHADS2 values include Age > 24 years old.  Plans are to continue warfarin for life.  Her last INR was 2.0.  Anticoagulation responsible provider: Bensimhon MD, Reuel Boom.  INR POC: 3.7.  Cuvette Lot#: [id number].  Exp: 08/2010.    Anticoagulation Management Assessment/Plan:      The patient's current anticoagulation dose is Coumadin 5 mg  tabs: take as directed  brand only.  The target INR is 2.5-3.5.  The next INR is due 08/17/2009.  Anticoagulation instructions were given to patient.  Results were reviewed/authorized by Cloyde Reams, RN, BSN.  She was notified by Cloyde Reams RN.         Prior Anticoagulation Instructions: INR: 3.1 Continue with same dosage of 2.5tablets every day Recheck in 4 weeks  Current Anticoagulation  Instructions: INR 3.7  Take 1/2 tablet today then resume same dosage 2.5 tablets daily.  Recheck in 4 weeks.

## 2010-06-27 NOTE — Medication Information (Signed)
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 no    Recent/future dental: no  Any missed doses?: no       Is patient compliant with meds? yes      Comments: Pt took an extra tablet yesterday (5 mg), but reports no other changes.   Allergies: 1)  Cleocin (Clindamycin Hcl)  Anticoagulation Management History:      The patient is taking warfarin and comes in today for a routine follow up visit.  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  Negative risk factors for bleeding include an age less than 37 years old.  The bleeding index is 'low risk'.  Negative CHADS2 values include Age > 57 years old.  Plans are to continue warfarin for life.  Her last INR was 7.2 ratio.  Anticoagulation responsible provider: Myrtis Ser MD, Tinnie Gens.  INR POC: 5.7.  Cuvette Lot#: [id number].  Exp: 03/2011.    Anticoagulation Management Assessment/Plan:      The patient's current anticoagulation dose is Coumadin 5 mg  tabs: take as directed  brand only.  The target INR is 2.5-3.5.  The next INR is due 04/16/2010.  Anticoagulation instructions were given to patient.  Results were reviewed/authorized by Reina Fuse, PharmD.  She was notified by Reina Fuse PharmD.         Prior Anticoagulation Instructions: INR 1.9  Take 3 tablets today, then continue taking same dose of 2 1/2 tablets  everyday except 2 tablets on Sunday and Thursday. Recheck in 4 weeks.  Current Anticoagulation  Instructions: INR 5.7  No Coumadin today, Tuesday, November 8th. Take Coumadin 1 tab (5 mg) on Wednesday, November 9th. Then resume taking Coumadin 2 tabs (10 mg) on Sundays and Thursdays and 2.5 tabs (12.5 mg) all other days.  Return to clinic in 2 weeks.  Return to clinic

## 2010-06-27 NOTE — Medication Information (Signed)
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 no    Recent/future dental: no  Any missed doses?: yes     Details: missed Wednesday's dose  Is patient compliant with meds? yes       Allergies: 1)  Cleocin (Clindamycin Hcl)  Anticoagulation Management History:      The patient is taking warfarin and comes in today for a routine follow up visit.  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  Negative risk factors for bleeding include an age less than 68 years old.  The bleeding index is 'low risk'.  Negative CHADS2 values include Age > 35 years old.  Plans are to continue warfarin for life.  Her last INR was 7.2 ratio.  Anticoagulation responsible provider: Jens Som MD, Arlys John.  INR POC: 1.8.  Cuvette Lot#: [id number].  Exp: 12/2010.    Anticoagulation Management Assessment/Plan:      The patient's current anticoagulation dose is Coumadin 5 mg  tabs: take as directed  brand only.  The target INR is 2.5-3.5.  The next INR is due 11/23/2009.  Anticoagulation instructions were given to patient.  Results were reviewed/authorized by Weston Brass, PharmD.  She was notified by Weston Brass PharmD.         Prior Anticoagulation Instructions: INR 4.5 Skip today Then change dose to 12.5mg s daily except 10mg s on Sundays. Recheck in 2 weeks.   Current Anticoagulation Instructions: INR 1.8  Take 3 tablets today and tomorrow then resume same dose of 2 1/2 tablets every day except 2  tablets on Sunday.  Recheck in 2 weeks.  Prescriptions: COUMADIN 5 MG  TABS (WARFARIN SODIUM) take as directed  brand only Brand medically necessary #100 Tablet x 1   Entered by:   Sally Putt PharmD   Authorized by:   Brian Saunders Crenshaw, MD, FACC   Signed by:   Sally Putt PharmD on 11/09/2009   Method used:   [facility] to        [facility] [address] [facility] ([facility]ail)       [address] [place], [place]  [address]       Ph: [phone]       Fax: [phone]   RxID:   [id number]

## 2010-06-27 NOTE — Medication Information (Signed)
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 yes       Details: Has had a few severe HA recently and took ibuprofen   Recent/future dental: no  Any missed doses?: no       Is patient compliant with meds? yes       Allergies: 1)  Cleocin (Clindamycin Hcl)  Anticoagulation Management History:      The patient is taking warfarin and comes in today for a routine follow up visit.  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  Negative risk factors for bleeding include an age less than 52 years old.  The bleeding index is 'low risk'.  Negative CHADS2 values include Age > 73 years old.  Plans are to continue warfarin for life.  Her last INR was 7.2 ratio.  Anticoagulation responsible provider: Shirlee Latch MD, Dalton.  INR POC: 3.9.  Cuvette Lot#: [id number].  Exp: 03/2011.    Anticoagulation Management Assessment/Plan:      The patient's current anticoagulation dose is Coumadin 5 mg  tabs: take as directed  brand only.  The target INR is 2.5-3.5.  The next INR is due 03/05/2010.  Anticoagulation instructions were given to patient.  Results were reviewed/authorized by Weston Brass, PharmD.  She was notified by Harrel Carina, PharmD candidate.         Prior Anticoagulation Instructions: INR 3.5  Continue taking 2.5 tablets (12.5mg ) every day except take 2 tablets (10mg ) on Sundays and Thursdays.   Recheck in 4 weeks.    Current Anticoagulation Instructions: INR 3.9  Today, take only 1 tablet. Then resume regular dose of 2 1/2 tablets everyday except 2 tablets on Sunday and Thursday. Re-check INR in 3 weeks.

## 2010-07-09 DIAGNOSIS — D6861 Antiphospholipid syndrome: Secondary | ICD-10-CM

## 2010-07-09 DIAGNOSIS — I82409 Acute embolism and thrombosis of unspecified deep veins of unspecified lower extremity: Secondary | ICD-10-CM

## 2010-07-09 DIAGNOSIS — I2699 Other pulmonary embolism without acute cor pulmonale: Secondary | ICD-10-CM

## 2010-07-09 HISTORY — DX: Other pulmonary embolism without acute cor pulmonale: I26.99

## 2010-07-31 ENCOUNTER — Encounter: Payer: Self-pay | Admitting: Cardiology

## 2010-07-31 ENCOUNTER — Encounter (INDEPENDENT_AMBULATORY_CARE_PROVIDER_SITE_OTHER): Payer: Self-pay

## 2010-07-31 DIAGNOSIS — I2699 Other pulmonary embolism without acute cor pulmonale: Secondary | ICD-10-CM

## 2010-07-31 DIAGNOSIS — Z7901 Long term (current) use of anticoagulants: Secondary | ICD-10-CM

## 2010-07-31 DIAGNOSIS — I80299 Phlebitis and thrombophlebitis of other deep vessels of unspecified lower extremity: Secondary | ICD-10-CM

## 2010-07-31 LAB — CONVERTED CEMR LAB: POC INR: 4

## 2010-08-06 NOTE — Medication Information (Signed)
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 no    Recent/future dental: no  Any missed doses?: no       Is patient compliant with meds? yes       Allergies: 1)  Cleocin (Clindamycin Hcl)  Anticoagulation Management History:      The patient is taking warfarin and comes in today for a routine follow up visit.  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  Negative risk factors for bleeding include an age less than 19 years old.  The bleeding index is 'low risk'.  Negative CHADS2 values include Age > 50 years old.  Plans are to continue warfarin for life.  Her last INR was 7.2 ratio.  Anticoagulation responsible provider: Myrtis Ser MD, Tinnie Gens.  INR POC: 4.0.  Cuvette Lot#: [id number].  Exp: 05/2011.    Anticoagulation Management Assessment/Plan:      The patient's current anticoagulation dose is Coumadin 5 mg  tabs: take as directed  brand only.  The target INR is 2.5-3.5.  The next INR is due 08/21/2010.  Anticoagulation instructions were given to patient.  Results were reviewed/authorized by Weston Brass, PharmD.  She was notified by Weston Brass PharmD.         Prior Anticoagulation Instructions: INR 2.3  Coumadin 5 mg tablets - Take 3 tablets today, then resume taking 2.5 tablets every day except 2 tablets on Sundays and Thursdays   Current Anticoagulation Instructions: INR 4.0  Skip today's dose of Coumadin then resume same  dose of 2 1/2 tablets every day except 2 tablets on Sunday and Thursday. Recheck INR in 3 weeks.

## 2010-08-20 ENCOUNTER — Ambulatory Visit (INDEPENDENT_AMBULATORY_CARE_PROVIDER_SITE_OTHER): Payer: Self-pay | Admitting: *Deleted

## 2010-08-20 DIAGNOSIS — I82409 Acute embolism and thrombosis of unspecified deep veins of unspecified lower extremity: Secondary | ICD-10-CM

## 2010-08-20 DIAGNOSIS — D6861 Antiphospholipid syndrome: Secondary | ICD-10-CM

## 2010-08-20 DIAGNOSIS — I2699 Other pulmonary embolism without acute cor pulmonale: Secondary | ICD-10-CM

## 2010-08-20 DIAGNOSIS — D6859 Other primary thrombophilia: Secondary | ICD-10-CM

## 2010-08-20 NOTE — Patient Instructions (Signed)
INR 1.5  Take 3 tablets today and tomorrow then resume same dose of 2 1/2 tablets every day except 2 tablets on Sunday and Thursday.  Recheck INR in 2 weeks.

## 2010-09-04 ENCOUNTER — Ambulatory Visit (INDEPENDENT_AMBULATORY_CARE_PROVIDER_SITE_OTHER): Payer: BC Managed Care – PPO | Admitting: *Deleted

## 2010-09-04 DIAGNOSIS — D6859 Other primary thrombophilia: Secondary | ICD-10-CM

## 2010-09-04 DIAGNOSIS — I82409 Acute embolism and thrombosis of unspecified deep veins of unspecified lower extremity: Secondary | ICD-10-CM

## 2010-09-04 DIAGNOSIS — I2699 Other pulmonary embolism without acute cor pulmonale: Secondary | ICD-10-CM

## 2010-09-04 DIAGNOSIS — Z7901 Long term (current) use of anticoagulants: Secondary | ICD-10-CM

## 2010-09-04 DIAGNOSIS — D6861 Antiphospholipid syndrome: Secondary | ICD-10-CM

## 2010-09-09 ENCOUNTER — Other Ambulatory Visit: Payer: Self-pay | Admitting: Internal Medicine

## 2010-09-09 ENCOUNTER — Ambulatory Visit (INDEPENDENT_AMBULATORY_CARE_PROVIDER_SITE_OTHER): Payer: BC Managed Care – PPO | Admitting: *Deleted

## 2010-09-09 DIAGNOSIS — D6861 Antiphospholipid syndrome: Secondary | ICD-10-CM

## 2010-09-09 DIAGNOSIS — I2699 Other pulmonary embolism without acute cor pulmonale: Secondary | ICD-10-CM

## 2010-09-09 DIAGNOSIS — D6859 Other primary thrombophilia: Secondary | ICD-10-CM

## 2010-09-09 DIAGNOSIS — I82409 Acute embolism and thrombosis of unspecified deep veins of unspecified lower extremity: Secondary | ICD-10-CM

## 2010-09-16 ENCOUNTER — Ambulatory Visit (INDEPENDENT_AMBULATORY_CARE_PROVIDER_SITE_OTHER): Payer: BC Managed Care – PPO | Admitting: *Deleted

## 2010-09-16 DIAGNOSIS — D6861 Antiphospholipid syndrome: Secondary | ICD-10-CM

## 2010-09-16 DIAGNOSIS — I2699 Other pulmonary embolism without acute cor pulmonale: Secondary | ICD-10-CM

## 2010-09-16 DIAGNOSIS — I82409 Acute embolism and thrombosis of unspecified deep veins of unspecified lower extremity: Secondary | ICD-10-CM

## 2010-09-16 DIAGNOSIS — D6859 Other primary thrombophilia: Secondary | ICD-10-CM

## 2010-09-16 LAB — POCT INR: INR: 3.5

## 2010-10-07 ENCOUNTER — Ambulatory Visit (INDEPENDENT_AMBULATORY_CARE_PROVIDER_SITE_OTHER): Payer: BC Managed Care – PPO | Admitting: *Deleted

## 2010-10-07 DIAGNOSIS — D6861 Antiphospholipid syndrome: Secondary | ICD-10-CM

## 2010-10-07 DIAGNOSIS — I82409 Acute embolism and thrombosis of unspecified deep veins of unspecified lower extremity: Secondary | ICD-10-CM

## 2010-10-07 DIAGNOSIS — D6859 Other primary thrombophilia: Secondary | ICD-10-CM

## 2010-10-07 DIAGNOSIS — I2699 Other pulmonary embolism without acute cor pulmonale: Secondary | ICD-10-CM

## 2010-10-11 NOTE — H&P (Signed)
[place]. [facility]  Patient:    Theresa Lewis, Theresa Lewis Visit Number: [id number] MRN:           Service Type: Attending:  Tera Mater. Clent Ridges, M.D. Dictated by:   Tera Mater Clent Ridges, M.D.                           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  HISTORY OF PRESENT ILLNESS:  The patient had a deep venous thrombosis in the left lower leg in December of 2002.  At that time, it was located in the distal P2 vein to the level of the trifurcation approaching the popliteal vein and involving the proximal perineal vein.  Prior to that, she had been on Loestrin for a six-month period for dysfunctional uterine bleeding.  She is an ex-smoker from many years ago.  She was treated with outpatient Lovenox therapy at that time and then switched to Coumadin for a while.  This was then stopped.  She recovered nicely.  She has not been back on hormone replacement at all since then.  Now she presents with three days of similar pain and swelling in the left calf and foot.  No recent trauma.  No chest pain or shortness of breath.  She sees Neta Mends. Panosh, M.D., normally for primary care.  Dr. Fabian Sharp had initiated a hypercoagulability work-up over the last month which has thus far shown some abnormalities.  She specifically had shown abnormalities in the lupus anticoagulant panel and the cardiolipin antibody panel and in the protein C panel.  I believe that plans were made to have her be evaluated by hematology, but this has not occurred yet.  PAST MEDICAL HISTORY:  In addition to DVT as above, she has had childbirth x 1.  She has had surgery to repair a rectal fissure.  She also had some anemia due to uterine bleeding last year, which resolved.  ALLERGIES:  None.  CURRENT MEDICATIONS:  None.  HABITS:  She quit smoking cigarettes 15 years ago.  She does not use  alcohol.  SOCIAL HISTORY:  She is married with one grown child.  She works as an Environmental health practitioner.  FAMILY HISTORY:  Not significant.  OBJECTIVE:  BP 120/90, temperature 98.4 degrees, respirations 12 and comfortable, pulse 64 and regular.  WEIGHT:  179 pounds.  GENERAL APPEARANCE:  She is alert and in no particular distress.  SKIN:  Free of significant lesions.  HEENT:  The eyes are clear.  The ears are clear.  The oropharynx is clear.  NECK:  Supple without lymphadenopathy or thyromegaly.  LUNGS:  Clear.  CARDIOVASCULAR:  Rate and rhythm regular without murmurs, rubs, or gallops. Distal pulses are full.  She has no edema in the extremities, except for the left lower extremity as below.  ABDOMEN:  Soft.  Normal bowel sounds.  Nontender.  No masses.  EXTREMITIES:  Edema and tenderness in the left calf and the left foot.  No cords are palpated.  No masses are palpated.  She does have a positive Homans sign at the left calf.  NEUROLOGIC:  Grossly intact.  ASSESSMENT AND PLAN:  Recurrent deep venous thrombosis in the left leg.  She will be admitted for anticoagulant therapy on Lovenox.  Hematology will be consulted to evaluate for hypercoagulable state. Dictated by:   Tera Mater  Clent Ridges, M.D. Attending:  Tera Mater. Clent Ridges, M.D. DD:  10/06/01 TD:  10/07/01 Job: [id number]

## 2010-10-11 NOTE — Discharge Summary (Signed)
[place]. [facility]  Patient:    Theresa Lewis, Theresa Lewis Visit Number: [id number] MRN: [id number]          Service Type: MED Location: [phone] Attending Physician:  Nelwyn Salisbury Dictated by:   Cornell Barman, P.A. Admit Date:  10/06/2001 Discharge Date: 10/07/2001   CC:         Jeannett Senior A. Clent Ridges, M.D.   Discharge Summary  DISCHARGE DIAGNOSIS: Recurrent left lower extremity deep venous thrombosis.  BRIEF ADMISSION HISTORY: The patient is a 50 year old female who presented with a recurrent left lower leg DVT initially diagnosed in December 2002.  PAST MEDICAL HISTORY: 1. A DVT in December 2002. 2. Childbirth x 1. 3. Surgical repair of a rectal fissure.  HOSPITAL COURSE:  #1 - RECURRENT LEFT DEEP VENOUS THROMBOSIS: The patient was admitted for Lovenox and Coumadin anticoagulation. Because this is her second DVT, we did ask for a hematology consult. The patient was seen in consultation by Dr. Darnelle Catalan. He noted that her hypercoagulable workup has been negative for any hereditary thrombotic condition, but positive for lupus anticoagulant and anticardiolipin IgG antibody. He did feel that at this point she would need life long anticoagulation, although he did note that after three months of coumadinization with an INR of 2 to 3, the patient could be maintained at a low level of 1.7 to 2.2 with less risk of bleeding.  DISCHARGE LABORATORY DATA:  Hemoglobin 11. Coags were normal. CMET was normal.  DISCHARGE MEDICATIONS: 1. Coumadin 5 mg tablets 1-1/2 q.d. as directed. 2. Lovenox 80 mg subcu b.i.d. until instructed to discontinue.  FOLLOWUP:  The patient was to see Dr. Clent Ridges on Friday, Oct 08, 2001, and Monday, Oct 11, 2001, for a protime. Dictated by:   Cornell Barman, P.A. Attending Physician:  Nelwyn Salisbury DD:  11/16/01 TD:  11/17/01 Job: [id number]

## 2010-10-11 NOTE — Consult Note (Signed)
[place]. [facility]  Patient:    Theresa Lewis, Theresa Lewis Visit Number: [id number] MRN: [id number]          Service Type: MED Location: [phone] Attending Physician:  Nelwyn Salisbury Dictated by:   Lorette Ang, N.P. Proc. Date: 10/06/01 Admit Date:  10/06/2001 Discharge Date: 10/07/2001   CC:         Neta Mends. Fabian Sharp, M.D. [facility]  Jeannett Senior A. Clent Ridges, M.D.   Consultation Report  DATE OF BIRTH:  [birth year].  REFERRING PHYSICIAN:  Tera Mater. Clent Ridges, M.D.  REASON FOR CONSULTATION:  Recurrent left lower extremity deep venous thrombosis.  HISTORY OF PRESENT ILLNESS:  Theresa Lewis is a 50 year old woman with a history of a left lower extremity DVT in December 2002.  She was treated with Lovenox for 10 days and Coumadin which she completed in mid-March 2003.  She reports that she was started on Loestrin in June of 2002 secondary to menorrhagia which was discontinued at the time of the diagnosis of the DVT. The patient reports onset of left foot edema, Sep 30, 2001, and left lower extremity "achiness" Oct 03, 2001.  Doppler study at [facility] on Oct 06, 2001 confirmed a recurrent DVT (same location per patient). Hypercoagulable workup had been initiated as an outpatient with results as follows:  Lupus anticoagulant detected, negative cardiolipin IgA antibody, positive cardiolipin IgG antibody (medium positive), negative cardiolipin IgM antibody, elevated protein C 190 (80-166), normal protein S, normal antithrombin 3 111% (75-125), homocystine 8.75 (normal 5 to 13.9), negative factor 5 leiden, and negative prothrombin gene mutation.  PAST MEDICAL HISTORY:  Left lower extremity DVT December 2002.  Recent urinary tract infection.  History of rectal fissures.  History of anemia.  MEDICATIONS:  Biotin (patient reports she purchased that at a health food store for hair regrowth).  ALLERGIES:  CLEOCIN which causes hair loss.  FAMILY HISTORY:   Mother is healthy.  Father is healthy.  Sister healthy. Sister healthy.  No family history of clotting or bleeding problems.  Maternal grandmother deceased with pancreatic cancer.  Maternal grandfather deceased with lung cancer.  GYNECOLOGY HISTORY:  G1, P1.  Oral contraceptive use June of 2002 to December of 2002.  No personal or family history of miscarriage.  SOCIAL HISTORY:  Theresa Lewis lives in [place] with her husband, Chakia Counts.  They have one daughter, Bebe Shaggy, who is 26 years old and healthy.  The patient is employed as an Environmental health practitioner at [employer].  HEALTH MAINTENANCE:  The patients primary care Mattilynn Forrer is Dr. Neta Mends. Panosh at [facility].  Last Pap/pelvic exam was in 2003 and was negative.  Mammogram in 2001 was negative.  She has never had a colonoscopy. She has no history of tobacco or alcohol use.  She does not receive the flu vaccine or pneumonia vaccine.  Emergency contact person is her mother, Solmon Ice.  She can be reached at [phone] or [phone].  The patient does not have a living will or health care power-of-attorney.  REVIEW OF SYSTEMS:  The patient denies any weight loss or anorexia.  Her energy level has been good.  She denies any fevers or night sweats.  She denies any unusual headaches or vision changes.  She denies any epistaxis or gum bleeding.  She has had no shortness of breath or cough.  She denies any chest pain.  She has noted some slight lower extremity edema and "achiness." She denies any recent change in her bowel habits  and has had no rectal bleeding.  She denies any nausea or vomiting.  She has had no hematuria or dysuria.  PHYSICAL EXAMINATION:  VITAL SIGNS:  Temperature 97.7, heart rate 56, respirations 16, blood pressure of 139/74.  Height 65 inches.  Weight 178.6 pounds.  GENERAL:  Well-nourished, pleasant African-American female in no acute distress.  HEENT:  Normocephalic and atraumatic.  Pupils are  equal, round and reactive to light.  Extraocular movements are intact.  Sclerae are anicteric.  Oropharynx is clear.  LYMPH NODES:  Soft, tender, small red axillary lymph node.  CHEST:  Lungs are clear bilaterally.  CARDIOVASCULAR:  Regular rate and rhythm.  ABDOMEN:  Soft and nontender.  No hepatosplenomegaly.  EXTREMITIES:  Trace to 1+ left lower extremity edema beginning at the knee and extending to the foot.  Pedal pulses are 2+ bilaterally.  Calves are soft. There was no erythema.  NEUROLOGICAL:  Alert and oriented x 3.  Motor strength is 5/5.  LABORATORY DATA:  Baseline PT 14.2, baseline PTT 77.  Hemoglobin 11, MCV 75.4, white count 3.8, platelets 254,000.  Sodium 142, potassium 3.5, BUN 8, creatinine 0.9, and glucose 106.  Total bilirubin 0.6, alkaline phosphatase 67, SGOT 15, SGPT 13, total protein 6.9, albumin 3.3, calcium 9.1.  Outpatient labs show homocystine 8.75, lupus anticoagulant positive. Cardiolipin IgG antibody positive.  Functional protein C 190, total protein C 68, functional protein S 86, total protein S 87, and antithrombin 3111.  Radiology:  Left lower extremity venous ultrasound (at level of the popliteal vein):  The vein is noncompressible and does not demonstrate flow.  IMPRESSION:  Theresa Lewis is a 50 year old [place] woman with a left lower extremity deep venous thrombosis in the setting of oral contraceptive use, treated with three months of Coumadin anticoagulation and now recurrent (again in the left lower extremity).  Hypercoagulable workup has been negative for a hereditary thrombotic condition but positive for the lupus anticoagulant and anticardiolipin IgG antibodies.  Dr. Valentino Hue. Magrinat discussed the above with Theresa Lewis and her husband and reassured her that she does not have afamiliar problem but let her know that her left lower swelling may or may not completely resolve.   She will need lifelong anticoagulation.  In this  regard, note that recent data suggest after three months of "full" coumadinization (INR 2 to 3), the patient may be maintained at a low level (1.7 to 2.2) with less risk of bleeding and equal effectiveness at clot prevention.  The patient needs to use appropriate contraception.  If she decides to get pregnant, she should be changed from Coumadin to Lovenox to be continued for several weeks beyond term.  If the left leg swelling persist, she may benefit from a referral to the lymphedema clinic.  The patient was seen and examined by Dr. Raymond Gurney C. Magrinat.  Labs were reviewed. Dictated by:   Lorette Ang, N.P. Attending Physician:  Nelwyn Salisbury DD:  10/08/01 TD:  10/10/01 Job: [id number]

## 2010-10-23 ENCOUNTER — Ambulatory Visit (INDEPENDENT_AMBULATORY_CARE_PROVIDER_SITE_OTHER): Payer: BC Managed Care – PPO | Admitting: *Deleted

## 2010-10-23 DIAGNOSIS — D6861 Antiphospholipid syndrome: Secondary | ICD-10-CM

## 2010-10-23 DIAGNOSIS — D6859 Other primary thrombophilia: Secondary | ICD-10-CM

## 2010-10-23 DIAGNOSIS — I2699 Other pulmonary embolism without acute cor pulmonale: Secondary | ICD-10-CM

## 2010-10-23 DIAGNOSIS — I82409 Acute embolism and thrombosis of unspecified deep veins of unspecified lower extremity: Secondary | ICD-10-CM

## 2010-10-23 LAB — POCT INR: INR: 3.4

## 2010-11-06 ENCOUNTER — Ambulatory Visit (INDEPENDENT_AMBULATORY_CARE_PROVIDER_SITE_OTHER): Payer: BC Managed Care – PPO | Admitting: *Deleted

## 2010-11-06 DIAGNOSIS — D6859 Other primary thrombophilia: Secondary | ICD-10-CM

## 2010-11-06 DIAGNOSIS — I2699 Other pulmonary embolism without acute cor pulmonale: Secondary | ICD-10-CM

## 2010-11-06 DIAGNOSIS — D6861 Antiphospholipid syndrome: Secondary | ICD-10-CM

## 2010-11-06 DIAGNOSIS — I82409 Acute embolism and thrombosis of unspecified deep veins of unspecified lower extremity: Secondary | ICD-10-CM

## 2010-11-06 LAB — POCT INR: INR: 4.3

## 2010-11-09 ENCOUNTER — Other Ambulatory Visit: Payer: Self-pay | Admitting: Cardiology

## 2010-11-11 ENCOUNTER — Other Ambulatory Visit: Payer: Self-pay | Admitting: *Deleted

## 2010-11-11 MED ORDER — WARFARIN SODIUM 5 MG PO TABS: ORAL_TABLET | ORAL | Status: DC

## 2010-11-26 ENCOUNTER — Ambulatory Visit (INDEPENDENT_AMBULATORY_CARE_PROVIDER_SITE_OTHER): Payer: BC Managed Care – PPO | Admitting: *Deleted

## 2010-11-26 DIAGNOSIS — D6859 Other primary thrombophilia: Secondary | ICD-10-CM

## 2010-11-26 DIAGNOSIS — I82409 Acute embolism and thrombosis of unspecified deep veins of unspecified lower extremity: Secondary | ICD-10-CM

## 2010-11-26 DIAGNOSIS — D6861 Antiphospholipid syndrome: Secondary | ICD-10-CM

## 2010-11-26 DIAGNOSIS — I2699 Other pulmonary embolism without acute cor pulmonale: Secondary | ICD-10-CM

## 2010-11-26 LAB — POCT INR: INR: 3

## 2010-12-24 ENCOUNTER — Encounter: Payer: BC Managed Care – PPO | Admitting: *Deleted

## 2011-01-23 ENCOUNTER — Other Ambulatory Visit: Payer: Self-pay | Admitting: Internal Medicine

## 2011-02-20 ENCOUNTER — Ambulatory Visit (INDEPENDENT_AMBULATORY_CARE_PROVIDER_SITE_OTHER): Payer: BC Managed Care – PPO | Admitting: *Deleted

## 2011-02-20 DIAGNOSIS — D6861 Antiphospholipid syndrome: Secondary | ICD-10-CM

## 2011-02-20 DIAGNOSIS — I2699 Other pulmonary embolism without acute cor pulmonale: Secondary | ICD-10-CM

## 2011-02-20 DIAGNOSIS — I82409 Acute embolism and thrombosis of unspecified deep veins of unspecified lower extremity: Secondary | ICD-10-CM

## 2011-02-20 DIAGNOSIS — D6859 Other primary thrombophilia: Secondary | ICD-10-CM

## 2011-03-07 ENCOUNTER — Ambulatory Visit (INDEPENDENT_AMBULATORY_CARE_PROVIDER_SITE_OTHER): Payer: BC Managed Care – PPO | Admitting: *Deleted

## 2011-03-07 DIAGNOSIS — D6859 Other primary thrombophilia: Secondary | ICD-10-CM

## 2011-03-07 DIAGNOSIS — I82409 Acute embolism and thrombosis of unspecified deep veins of unspecified lower extremity: Secondary | ICD-10-CM

## 2011-03-07 DIAGNOSIS — D6861 Antiphospholipid syndrome: Secondary | ICD-10-CM

## 2011-03-07 DIAGNOSIS — I2699 Other pulmonary embolism without acute cor pulmonale: Secondary | ICD-10-CM

## 2011-03-21 ENCOUNTER — Ambulatory Visit (INDEPENDENT_AMBULATORY_CARE_PROVIDER_SITE_OTHER): Payer: BC Managed Care – PPO | Admitting: *Deleted

## 2011-03-21 DIAGNOSIS — I2699 Other pulmonary embolism without acute cor pulmonale: Secondary | ICD-10-CM

## 2011-03-21 DIAGNOSIS — D6859 Other primary thrombophilia: Secondary | ICD-10-CM

## 2011-03-21 DIAGNOSIS — I82409 Acute embolism and thrombosis of unspecified deep veins of unspecified lower extremity: Secondary | ICD-10-CM

## 2011-03-21 DIAGNOSIS — D6861 Antiphospholipid syndrome: Secondary | ICD-10-CM

## 2011-04-10 ENCOUNTER — Ambulatory Visit (INDEPENDENT_AMBULATORY_CARE_PROVIDER_SITE_OTHER): Payer: BC Managed Care – PPO | Admitting: *Deleted

## 2011-04-10 DIAGNOSIS — I2699 Other pulmonary embolism without acute cor pulmonale: Secondary | ICD-10-CM

## 2011-04-10 DIAGNOSIS — D6859 Other primary thrombophilia: Secondary | ICD-10-CM

## 2011-04-10 DIAGNOSIS — D6861 Antiphospholipid syndrome: Secondary | ICD-10-CM

## 2011-04-10 DIAGNOSIS — I82409 Acute embolism and thrombosis of unspecified deep veins of unspecified lower extremity: Secondary | ICD-10-CM

## 2011-04-10 LAB — POCT INR: INR: 3.4

## 2011-05-03 ENCOUNTER — Other Ambulatory Visit: Payer: Self-pay | Admitting: Cardiology

## 2011-05-08 ENCOUNTER — Encounter: Payer: BC Managed Care – PPO | Admitting: *Deleted

## 2011-05-14 ENCOUNTER — Ambulatory Visit (INDEPENDENT_AMBULATORY_CARE_PROVIDER_SITE_OTHER): Payer: BC Managed Care – PPO | Admitting: *Deleted

## 2011-05-14 DIAGNOSIS — D6859 Other primary thrombophilia: Secondary | ICD-10-CM

## 2011-05-14 DIAGNOSIS — I2699 Other pulmonary embolism without acute cor pulmonale: Secondary | ICD-10-CM

## 2011-05-14 DIAGNOSIS — I82409 Acute embolism and thrombosis of unspecified deep veins of unspecified lower extremity: Secondary | ICD-10-CM

## 2011-05-14 DIAGNOSIS — D6861 Antiphospholipid syndrome: Secondary | ICD-10-CM

## 2011-05-14 LAB — POCT INR: INR: 4.9

## 2011-06-03 ENCOUNTER — Encounter: Payer: BC Managed Care – PPO | Admitting: *Deleted

## 2011-07-17 ENCOUNTER — Ambulatory Visit (INDEPENDENT_AMBULATORY_CARE_PROVIDER_SITE_OTHER): Payer: BC Managed Care – PPO | Admitting: *Deleted

## 2011-07-17 DIAGNOSIS — I2699 Other pulmonary embolism without acute cor pulmonale: Secondary | ICD-10-CM

## 2011-07-17 DIAGNOSIS — D6861 Antiphospholipid syndrome: Secondary | ICD-10-CM

## 2011-07-17 DIAGNOSIS — D6859 Other primary thrombophilia: Secondary | ICD-10-CM

## 2011-07-17 DIAGNOSIS — I82409 Acute embolism and thrombosis of unspecified deep veins of unspecified lower extremity: Secondary | ICD-10-CM

## 2011-07-17 LAB — POCT INR: INR: 5.8

## 2011-07-21 ENCOUNTER — Other Ambulatory Visit: Payer: Self-pay

## 2011-07-21 ENCOUNTER — Emergency Department (HOSPITAL_COMMUNITY): Payer: BC Managed Care – PPO

## 2011-07-21 ENCOUNTER — Encounter (HOSPITAL_COMMUNITY): Payer: Self-pay | Admitting: *Deleted

## 2011-07-21 ENCOUNTER — Emergency Department (HOSPITAL_COMMUNITY)
Admission: EM | Admit: 2011-07-21 | Discharge: 2011-07-21 | Disposition: A | Discharge status: Home / Self Care | Payer: BC Managed Care – PPO | Attending: Emergency Medicine | Admitting: Emergency Medicine

## 2011-07-21 ENCOUNTER — Telehealth: Payer: Self-pay | Admitting: *Deleted

## 2011-07-21 DIAGNOSIS — Z86718 Personal history of other venous thrombosis and embolism: Secondary | ICD-10-CM | POA: Insufficient documentation

## 2011-07-21 DIAGNOSIS — R079 Chest pain, unspecified: Secondary | ICD-10-CM | POA: Insufficient documentation

## 2011-07-21 DIAGNOSIS — Z7901 Long term (current) use of anticoagulants: Secondary | ICD-10-CM | POA: Insufficient documentation

## 2011-07-21 HISTORY — DX: Other pulmonary embolism without acute cor pulmonale: I26.99

## 2011-07-21 HISTORY — DX: Acute embolism and thrombosis of unspecified deep veins of unspecified lower extremity: I82.409

## 2011-07-21 LAB — CBC
MCH: 26.1 pg (ref 26.0–34.0)
MCHC: 32.5 g/dL (ref 30.0–36.0)
MCV: 80.4 fL (ref 78.0–100.0)
Platelets: 296 10*3/uL (ref 150–400)
RDW: 14.1 % (ref 11.5–15.5)

## 2011-07-21 LAB — BASIC METABOLIC PANEL
Calcium: 9.5 mg/dL (ref 8.4–10.5)
Creatinine, Ser: 0.79 mg/dL (ref 0.50–1.10)
GFR calc Af Amer: >90 mL/min (ref >90)

## 2011-07-21 LAB — PROTIME-INR: Prothrombin Time: 19.6 seconds — ABNORMAL HIGH (ref 11.6–15.2)

## 2011-07-21 MED ORDER — ASPIRIN 81 MG PO CHEW
324.0000 mg | CHEWABLE_TABLET | Freq: Once | ORAL | Status: AC
  Administered 2011-07-21: 324 mg via ORAL

## 2011-07-21 MED ORDER — ASPIRIN 325 MG PO TABS: 325.0000 mg | ORAL_TABLET | ORAL | Status: DC

## 2011-07-21 MED ORDER — ASPIRIN 81 MG PO CHEW
CHEWABLE_TABLET | ORAL | Status: AC
  Filled 2011-07-21: qty 4

## 2011-07-21 NOTE — Telephone Encounter (Signed)
Pt is calling complaining of deep, heavy chest pain off and on x several days.  Very fatigued.  Advised ER ASAP.

## 2011-07-21 NOTE — Telephone Encounter (Signed)
This message was not on my desk top when I checked   At the beginning of afternoon clinic.    I agree with advice give fu   Phone contact  to see how she is doing after evaluation.

## 2011-07-21 NOTE — ED Notes (Signed)
Patient states she thought she had indigestion in her chest,  She tried otc tx w/o relief.  She states she is having pain into her back.  Patient reports she is on coumadin,  She stopped her coumadin on thurs and Friday  Due to inr 5.8.  Patient denies any frank bleeding in her stools.  Patient describes chest pain that radiates into her back.  She also reports she is feeling tired and feels like she wants to go to sleep

## 2011-07-21 NOTE — Discharge Instructions (Signed)
Chest Pain (Nonspecific) It is often hard to give a specific diagnosis for the cause of chest pain. There is always a chance that your pain could be related to something serious, such as a heart attack or a blood clot in the lungs. You need to follow up with your caregiver for further evaluation. CAUSES   Heartburn.   Pneumonia or bronchitis.   Anxiety and stress.   Inflammation around your heart (pericarditis) or lung (pleuritis or pleurisy).   A blood clot in the lung.   A collapsed lung (pneumothorax). It can develop suddenly on its own (spontaneous pneumothorax) or from injury (trauma) to the chest.  The chest wall is composed of bones, muscles, and cartilage. Any of these can be the source of the pain.  The bones can be bruised by injury.   The muscles or cartilage can be strained by coughing or overwork.   The cartilage can be affected by inflammation and become sore (costochondritis).  DIAGNOSIS  Lab tests or other studies, such as X-rays, an EKG, stress testing, or cardiac imaging, may be needed to find the cause of your pain.  TREATMENT   Treatment depends on what may be causing your chest pain. Treatment may include:   Acid blockers for heartburn.   Anti-inflammatory medicine.   Pain medicine for inflammatory conditions.   Antibiotics if an infection is present.   You may be advised to change lifestyle habits. This includes stopping smoking and avoiding caffeine and chocolate.   You may be advised to keep your head raised (elevated) when sleeping. This reduces the chance of acid going backward from your stomach into your esophagus.   Most of the time, nonspecific chest pain will improve within 2 to 3 days with rest and mild pain medicine.  HOME CARE INSTRUCTIONS   If antibiotics were prescribed, take the full amount even if you start to feel better.   For the next few days, avoid physical activities that bring on chest pain. Continue physical activities as  directed.   Do not smoke cigarettes or drink alcohol until your symptoms are gone.   Only take over-the-counter or prescription medicine for pain, discomfort, or fever as directed by your caregiver.   Follow your caregiver's suggestions for further testing if your chest pain does not go away.   Keep any follow-up appointments you made. If you do not go to an appointment, you could develop lasting (chronic) problems with pain. If there is any problem keeping an appointment, you must call to reschedule.  SEEK MEDICAL CARE IF:   You think you are having problems from the medicine you are taking. Read your medicine instructions carefully.   Your chest pain does not go away, even after treatment.   You develop a rash with blisters on your chest.  SEEK IMMEDIATE MEDICAL CARE IF:   You have increased chest pain or pain that spreads to your arm, neck, jaw, back, or belly (abdomen).   You develop shortness of breath, an increasing cough, or you are coughing up blood.   You have severe back or abdominal pain, feel sick to your stomach (nauseous) or throw up (vomit).   You develop severe weakness, fainting, or chills.   You have an oral temperature above 102 F (38.9 C), not controlled by medicine.  THIS IS AN EMERGENCY. Do not wait to see if the pain will go away. Get medical help at once. Call your local emergency services (911 in [place] Do not drive yourself to   the hospital. MAKE SURE YOU:   Understand these instructions.   Will watch your condition.   Will get help right away if you are not doing well or get worse.  Document Released: 02/19/2005 Document Revised: 01/22/2011 Document Reviewed: 12/16/2007 Exit[employer] Patient Information 2012 [employer]. 

## 2011-07-21 NOTE — ED Provider Notes (Signed)
History     CSN: [id number]  Arrival date & time 07/21/11  1335   First MD Initiated Contact with Patient 07/21/11 1720      Chief Complaint  Patient presents with  . Chest Pain    (Consider location/radiation/quality/duration/timing/severity/associated sxs/prior treatment) HPI Comments: Has history of PE, coumadin was recently decrease due to an INR of 6.    Patient is a 51 y.o. female presenting with chest pain. The history is provided by the patient.  Chest Pain The chest pain began 2 days ago. Chest pain occurs constantly. The chest pain is unchanged. The severity of the pain is mild. The quality of the pain is described as tightness. The pain radiates to the precordial region. Chest pain is worsened by certain positions (worse with leaning forward, better with lying back.). Pertinent negatives for primary symptoms include no fever, no fatigue, no cough, no wheezing, no nausea, no vomiting and no dizziness. She tried nothing for the symptoms. Risk factors include no known risk factors.  Pertinent negatives for past medical history include no CAD, no COPD and no CHF.     Past Medical History  Diagnosis Date  . Deep venous thrombosis   . Pulmonary embolism     Past Surgical History  Procedure Date  . Tubal ligation   . Myomectomy     No family history on file.  History  Substance Use Topics  . Smoking status: Never Smoker   . Smokeless tobacco: Not on file  . Alcohol Use: No    OB History    Grav Para Term Preterm Abortions TAB SAB Ect Mult Living                  Review of Systems  Constitutional: Negative for fever and fatigue.  Respiratory: Negative for cough and wheezing.   Cardiovascular: Positive for chest pain.  Gastrointestinal: Negative for nausea and vomiting.  Neurological: Negative for dizziness.  All other systems reviewed and are negative.    Allergies  Clindamycin/lincomycin  Home Medications   Current Outpatient Rx  Name Route Sig  Dispense Refill  . [employer] STOOL SOFTENER 100 MG PO CAPS  TAKE 1 CAPSULE BY MOUTH AT BEDTIME AS NEEDED 30 capsule 0  . WARFARIN SODIUM 5 MG PO TABS Oral Take 10-12.5 mg by mouth daily. Take 10mg  by mouth daily except take 12.5mg  on Sunday ans thursday      BP 145/71  Pulse 55  Temp(Src) 97.6 F (36.4 C) (Oral)  Resp 18  Ht 5\' 5"  (1.651 m)  Wt 200 lb (90.719 kg)  BMI 33.28 kg/m2  SpO2 99%  Physical Exam  Nursing note and vitals reviewed. Constitutional: She is oriented to person, place, and time. She appears well-developed and well-nourished. No distress.  HENT:  Head: Normocephalic and atraumatic.  Neck: Normal range of motion. Neck supple.  Cardiovascular: Normal rate and regular rhythm.  Exam reveals no gallop and no friction rub.   No murmur heard. Pulmonary/Chest: Effort normal and breath sounds normal. No respiratory distress. She has no wheezes.  Abdominal: Soft. Bowel sounds are normal. She exhibits no distension. There is no tenderness.  Musculoskeletal: Normal range of motion.  Neurological: She is alert and oriented to person, place, and time.  Skin: Skin is warm and dry. She is not diaphoretic.    ED Course  Procedures (including critical care time)  Labs Reviewed  PROTIME-INR - Abnormal; Notable for the following:    Prothrombin Time 19.6 (*)  INR 1.63 (*)    All other components within normal limits  CBC  BASIC METABOLIC PANEL  POCT I-STAT TROPONIN I   Dg Chest 2 View  07/21/2011  *RADIOLOGY REPORT*  Clinical Data: Chest pain, shortness of breath  CHEST - 2 VIEW  Comparison: None.  Findings: Lungs are clear. No pleural effusion or pneumothorax.  Cardiomediastinal silhouette is within normal limits.  Mild degenerative changes of the visualized thoracolumbar spine.  IMPRESSION: No evidence of acute cardiopulmonary disease.  Original Report Authenticated By: Charline Bills, M.D.     No diagnosis found.   Date: 07/21/2011  Rate: 57  Rhythm: Normal sinus  rhythm  QRS Axis: normal  Intervals: normal  ST/T Wave abnormalities: normal  Conduction Disutrbances:none  Narrative Interpretation:   Old EKG Reviewed: unchanged    MDM  The symptoms are atypical for cardiac pain.  The ekg and enzymes are normal with 2 days of symptoms.  I discussed CDU protocol with her, however she prefers to go home and follow up with Cardiology for possible stress test.  She will return if worsens.        Geoffery Lyons, MD 07/21/11 [phone]

## 2011-07-22 NOTE — Telephone Encounter (Signed)
Left a message for pt to return call 

## 2011-07-23 NOTE — Telephone Encounter (Signed)
Left a message for pt to return call 

## 2011-07-25 ENCOUNTER — Telehealth: Payer: Self-pay | Admitting: *Deleted

## 2011-07-25 NOTE — Telephone Encounter (Signed)
Pt called to let us know she did go to the ER, and they did not find any Cardiac problems.  Wants to thank [place] for checking on her, and she will call for appt. Soon.

## 2011-10-05 ENCOUNTER — Other Ambulatory Visit: Payer: Self-pay | Admitting: Cardiology

## 2011-10-06 ENCOUNTER — Ambulatory Visit (INDEPENDENT_AMBULATORY_CARE_PROVIDER_SITE_OTHER): Payer: BC Managed Care – PPO | Admitting: Pharmacist

## 2011-10-06 DIAGNOSIS — D6859 Other primary thrombophilia: Secondary | ICD-10-CM

## 2011-10-06 DIAGNOSIS — I2699 Other pulmonary embolism without acute cor pulmonale: Secondary | ICD-10-CM

## 2011-10-06 DIAGNOSIS — D6861 Antiphospholipid syndrome: Secondary | ICD-10-CM

## 2011-10-06 DIAGNOSIS — I82409 Acute embolism and thrombosis of unspecified deep veins of unspecified lower extremity: Secondary | ICD-10-CM

## 2011-10-06 LAB — POCT INR: INR: 3.4

## 2011-10-06 MED ORDER — WARFARIN SODIUM 5 MG PO TABS: 10.0000 mg | ORAL_TABLET | Freq: Every day | ORAL | Status: DC

## 2011-10-27 ENCOUNTER — Ambulatory Visit (INDEPENDENT_AMBULATORY_CARE_PROVIDER_SITE_OTHER): Payer: BC Managed Care – PPO

## 2011-10-27 DIAGNOSIS — D6859 Other primary thrombophilia: Secondary | ICD-10-CM

## 2011-10-27 DIAGNOSIS — D6861 Antiphospholipid syndrome: Secondary | ICD-10-CM

## 2011-10-27 DIAGNOSIS — I82409 Acute embolism and thrombosis of unspecified deep veins of unspecified lower extremity: Secondary | ICD-10-CM

## 2011-10-27 DIAGNOSIS — I2699 Other pulmonary embolism without acute cor pulmonale: Secondary | ICD-10-CM

## 2011-10-27 LAB — PROTIME-INR: INR: 5.53 (ref <1.50)

## 2011-11-06 ENCOUNTER — Ambulatory Visit (INDEPENDENT_AMBULATORY_CARE_PROVIDER_SITE_OTHER): Payer: BC Managed Care – PPO | Admitting: *Deleted

## 2011-11-06 DIAGNOSIS — D6859 Other primary thrombophilia: Secondary | ICD-10-CM

## 2011-11-06 DIAGNOSIS — I82409 Acute embolism and thrombosis of unspecified deep veins of unspecified lower extremity: Secondary | ICD-10-CM

## 2011-11-06 DIAGNOSIS — D6861 Antiphospholipid syndrome: Secondary | ICD-10-CM

## 2011-11-06 DIAGNOSIS — I2699 Other pulmonary embolism without acute cor pulmonale: Secondary | ICD-10-CM

## 2011-11-06 LAB — POCT INR: INR: 3.5

## 2011-11-25 ENCOUNTER — Ambulatory Visit (INDEPENDENT_AMBULATORY_CARE_PROVIDER_SITE_OTHER): Payer: BC Managed Care – PPO | Admitting: *Deleted

## 2011-11-25 DIAGNOSIS — D6859 Other primary thrombophilia: Secondary | ICD-10-CM

## 2011-11-25 DIAGNOSIS — I82409 Acute embolism and thrombosis of unspecified deep veins of unspecified lower extremity: Secondary | ICD-10-CM

## 2011-11-25 DIAGNOSIS — D6861 Antiphospholipid syndrome: Secondary | ICD-10-CM

## 2011-11-25 DIAGNOSIS — I2699 Other pulmonary embolism without acute cor pulmonale: Secondary | ICD-10-CM

## 2011-11-25 LAB — PROTIME-INR
INR: 6.7 ratio (ref 0.8–1.0)
Prothrombin Time: 74.8 s (ref 10.2–12.4)

## 2011-12-02 ENCOUNTER — Ambulatory Visit (INDEPENDENT_AMBULATORY_CARE_PROVIDER_SITE_OTHER): Payer: BC Managed Care – PPO | Admitting: *Deleted

## 2011-12-02 DIAGNOSIS — I82409 Acute embolism and thrombosis of unspecified deep veins of unspecified lower extremity: Secondary | ICD-10-CM

## 2011-12-02 DIAGNOSIS — D6861 Antiphospholipid syndrome: Secondary | ICD-10-CM

## 2011-12-02 DIAGNOSIS — I2699 Other pulmonary embolism without acute cor pulmonale: Secondary | ICD-10-CM

## 2011-12-02 DIAGNOSIS — D6859 Other primary thrombophilia: Secondary | ICD-10-CM

## 2011-12-04 ENCOUNTER — Other Ambulatory Visit: Payer: Self-pay | Admitting: Internal Medicine

## 2012-01-14 ENCOUNTER — Telehealth: Payer: Self-pay | Admitting: *Deleted

## 2012-01-14 NOTE — Telephone Encounter (Signed)
Patient did call back and made appt in September for coumadin check.

## 2012-01-14 NOTE — Telephone Encounter (Signed)
Notified patient by phone missed July appt for couamdin clinic, while talking to pt her phone cut off and I was unable to contact her afterwards, due for f/u 12/12/2011 at coumadin clinic.

## 2012-01-18 ENCOUNTER — Other Ambulatory Visit: Payer: Self-pay | Admitting: Internal Medicine

## 2012-01-29 ENCOUNTER — Ambulatory Visit (INDEPENDENT_AMBULATORY_CARE_PROVIDER_SITE_OTHER): Payer: BC Managed Care – PPO

## 2012-01-29 DIAGNOSIS — D6859 Other primary thrombophilia: Secondary | ICD-10-CM

## 2012-01-29 DIAGNOSIS — D6861 Antiphospholipid syndrome: Secondary | ICD-10-CM

## 2012-01-29 DIAGNOSIS — I2699 Other pulmonary embolism without acute cor pulmonale: Secondary | ICD-10-CM

## 2012-01-29 DIAGNOSIS — I82409 Acute embolism and thrombosis of unspecified deep veins of unspecified lower extremity: Secondary | ICD-10-CM

## 2012-02-23 ENCOUNTER — Ambulatory Visit (INDEPENDENT_AMBULATORY_CARE_PROVIDER_SITE_OTHER): Payer: BC Managed Care – PPO

## 2012-02-23 DIAGNOSIS — I2699 Other pulmonary embolism without acute cor pulmonale: Secondary | ICD-10-CM

## 2012-02-23 DIAGNOSIS — D6861 Antiphospholipid syndrome: Secondary | ICD-10-CM

## 2012-02-23 DIAGNOSIS — D6859 Other primary thrombophilia: Secondary | ICD-10-CM

## 2012-02-23 DIAGNOSIS — I82409 Acute embolism and thrombosis of unspecified deep veins of unspecified lower extremity: Secondary | ICD-10-CM

## 2012-03-13 ENCOUNTER — Other Ambulatory Visit: Payer: Self-pay | Admitting: Internal Medicine

## 2012-03-22 ENCOUNTER — Ambulatory Visit (INDEPENDENT_AMBULATORY_CARE_PROVIDER_SITE_OTHER): Payer: BC Managed Care – PPO | Admitting: Internal Medicine

## 2012-03-22 ENCOUNTER — Encounter: Payer: Self-pay | Admitting: Internal Medicine

## 2012-03-22 VITALS — BP 138/90 | HR 63 | Temp 98.1°F | Wt 216.0 lb

## 2012-03-22 DIAGNOSIS — Z7901 Long term (current) use of anticoagulants: Secondary | ICD-10-CM

## 2012-03-22 DIAGNOSIS — D6859 Other primary thrombophilia: Secondary | ICD-10-CM

## 2012-03-22 DIAGNOSIS — Z23 Encounter for immunization: Secondary | ICD-10-CM

## 2012-03-22 DIAGNOSIS — D6861 Antiphospholipid syndrome: Secondary | ICD-10-CM

## 2012-03-22 DIAGNOSIS — Z86718 Personal history of other venous thrombosis and embolism: Secondary | ICD-10-CM

## 2012-03-22 NOTE — Patient Instructions (Addendum)
Plan cpx and labs  Pap. Get a mammogram.  Will do a anticoagulation clinic referral for our site.  Also.

## 2012-03-22 NOTE — Progress Notes (Signed)
  Subjective:    Patient ID: Theresa Lewis, female    DOB: 08-22-1960, 51 y.o.   MRN: [id number]  HPI Pt comes in t reestablish PCP and change anticoag clinic.  She was in our practice when she developed DVT on oral contraceptive pills to control her uterine bleeding. She had 2 other clotting episodes after that and her GYN had a filter placed. She was noted to have antiphospholipid in her blood and felt to have hypercoagulable state a candidate for lifelong anticoagulation at that time. Since that time she has done well being followed by the anticoagulation clinic and cardiology. However she would like to reestablish and come to our personal clinic. She has not had a preventive visit her checkup in a number of years. Including a mammogram and Pap currently she hasPeriods a few times a year.  24 hour cramping. She doesn't really wear compression stockings because they feel like they're too tight sometimes gets swelling in her right leg but no pain redness or streaking No excessive bleeding otherwise. Review of Systems ROS:  GEN/ HEENT: No fever, significant weight changes sweats headaches vision problems hearing changes, CV/ PULM; No chest pain shortness of breath cough, syncope,edema  change in exercise tolerance. GI /GU: No adominal pain, vomiting, change in bowel habits. No blood in the stool. No significant GU symptoms. SKIN/HEME: ,no acute skin rashes suspicious lesions or bleeding. No lymphadenopathy, nodules, masses.  NEURO/ PSYCH:  No neurologic signs such as weakness numbness. No depression anxiety. IMM/ Allergy: No unusual infections.  Allergy .   REST of 12 system review negative except as per HPI  Past history family history social history reviewed and updated in the electronic medical record.     Objective:   Physical Exam BP 138/90  Pulse 63  Temp 98.1 F (36.7 C) (Oral)  Wt 216 lb (97.977 kg)  SpO2 98% WDWN  aa f in nad looks healthy Neck: Supple without adenopathy or  masses or bruits Chest:  Clear to A&P without wheezes rales or rhonchi CV:  S1-S2 no gallops or murmurs peripheral perfusion is normal Abdomen:  Sof,t normal bowel sounds without hepatosplenomegaly, no guarding rebound or masses no CVA tenderness Legs in stocking right  Larger than left  Gait nl.      Assessment & Plan:  Chronic anticoagulation   Hx thrombosis x 3 dvt and pe   Antiphospholipid syndrome hypercoagulable state. No primary care preventive for a number of years to reestablish.  Flu vaccine today.   Plan cpx with labs and  Pap  With in next 3 months  Spoke with Adline Mango to plan transfer of care  To [facility].  Briefly intoduced poss of  antixa inhibtors xeralto etc  In the future.   If appropriate

## 2012-04-06 ENCOUNTER — Ambulatory Visit (INDEPENDENT_AMBULATORY_CARE_PROVIDER_SITE_OTHER): Payer: BC Managed Care – PPO | Admitting: Family

## 2012-04-06 DIAGNOSIS — I82409 Acute embolism and thrombosis of unspecified deep veins of unspecified lower extremity: Secondary | ICD-10-CM

## 2012-04-06 DIAGNOSIS — D6861 Antiphospholipid syndrome: Secondary | ICD-10-CM

## 2012-04-06 DIAGNOSIS — D6859 Other primary thrombophilia: Secondary | ICD-10-CM

## 2012-04-06 DIAGNOSIS — I2699 Other pulmonary embolism without acute cor pulmonale: Secondary | ICD-10-CM

## 2012-04-06 LAB — POCT INR: INR: 1.6

## 2012-04-06 NOTE — Patient Instructions (Signed)
Take 3 tabs today and tomorrow, then resume same dosage 2 tablets daily except on Sundays take 2.5 tablets. Recheck in 2 weeks.    Latest dosing instructions   Total Sun Mon Tue Wed Thu Fri Sat   72.5 12.5 mg 10 mg 10 mg 10 mg 10 mg 10 mg 10 mg    (5 mg2.5) (5 mg2) (5 mg2) (5 mg2) (5 mg2) (5 mg2) (5 mg2)

## 2012-04-20 ENCOUNTER — Ambulatory Visit (INDEPENDENT_AMBULATORY_CARE_PROVIDER_SITE_OTHER): Payer: BC Managed Care – PPO | Admitting: Family

## 2012-04-20 DIAGNOSIS — I82409 Acute embolism and thrombosis of unspecified deep veins of unspecified lower extremity: Secondary | ICD-10-CM

## 2012-04-20 DIAGNOSIS — I2699 Other pulmonary embolism without acute cor pulmonale: Secondary | ICD-10-CM

## 2012-04-20 DIAGNOSIS — D6861 Antiphospholipid syndrome: Secondary | ICD-10-CM

## 2012-04-20 DIAGNOSIS — D6859 Other primary thrombophilia: Secondary | ICD-10-CM

## 2012-04-20 NOTE — Patient Instructions (Signed)
Hold coumadin x 3 day (Tues, Wed, and Thurs). Then resume same dosage 2 tablets daily except on Sundays take 2.5 tablets. Recheck in 2 weeks.    Latest dosing instructions   Total Sun Mon Tue Wed Thu Fri Sat   72.5 12.5 mg 10 mg 10 mg 10 mg 10 mg 10 mg 10 mg    (5 mg2.5) (5 mg2) (5 mg2) (5 mg2) (5 mg2) (5 mg2) (5 mg2)

## 2012-05-04 ENCOUNTER — Encounter: Payer: BC Managed Care – PPO | Admitting: Family

## 2012-05-06 ENCOUNTER — Ambulatory Visit (INDEPENDENT_AMBULATORY_CARE_PROVIDER_SITE_OTHER): Payer: BC Managed Care – PPO | Admitting: Family

## 2012-05-06 DIAGNOSIS — D6859 Other primary thrombophilia: Secondary | ICD-10-CM

## 2012-05-06 DIAGNOSIS — I2699 Other pulmonary embolism without acute cor pulmonale: Secondary | ICD-10-CM

## 2012-05-06 DIAGNOSIS — I82409 Acute embolism and thrombosis of unspecified deep veins of unspecified lower extremity: Secondary | ICD-10-CM

## 2012-05-06 DIAGNOSIS — Z23 Encounter for immunization: Secondary | ICD-10-CM

## 2012-05-06 DIAGNOSIS — D6861 Antiphospholipid syndrome: Secondary | ICD-10-CM

## 2012-05-06 DIAGNOSIS — Z2911 Encounter for prophylactic immunotherapy for respiratory syncytial virus (RSV): Secondary | ICD-10-CM

## 2012-05-06 NOTE — Patient Instructions (Addendum)
Take and extra 1/2 tab today and tomorrow. Then resume same dosage 2 tablets daily except on Sundays take 2.5 tablets. Recheck in 3 weeks.    Latest dosing instructions   Total Sun Mon Tue Wed Thu Fri Sat   72.5 12.5 mg 10 mg 10 mg 10 mg 10 mg 10 mg 10 mg    (5 mg2.5) (5 mg2) (5 mg2) (5 mg2) (5 mg2) (5 mg2) (5 mg2)

## 2012-05-06 NOTE — Addendum Note (Signed)
Addended by: Beverely Low on: 05/06/2012 08:50 AM   Modules accepted: Orders

## 2012-05-15 ENCOUNTER — Other Ambulatory Visit: Payer: Self-pay | Admitting: Family

## 2012-05-31 ENCOUNTER — Ambulatory Visit (INDEPENDENT_AMBULATORY_CARE_PROVIDER_SITE_OTHER): Payer: BC Managed Care – PPO | Admitting: Family

## 2012-05-31 DIAGNOSIS — D6859 Other primary thrombophilia: Secondary | ICD-10-CM

## 2012-05-31 DIAGNOSIS — I2699 Other pulmonary embolism without acute cor pulmonale: Secondary | ICD-10-CM

## 2012-05-31 DIAGNOSIS — I82409 Acute embolism and thrombosis of unspecified deep veins of unspecified lower extremity: Secondary | ICD-10-CM

## 2012-05-31 DIAGNOSIS — D6861 Antiphospholipid syndrome: Secondary | ICD-10-CM

## 2012-05-31 LAB — POCT INR: INR: 6

## 2012-05-31 NOTE — Patient Instructions (Addendum)
Hold Coumadin x 4 days (Monday, Tuesday, Wednesday, and Thursday). Resume on Friday.  2 tablets daily except on Sundays take 2.5 tablets. Recheck in 2 weeks.    Latest dosing instructions   Total Sun Mon Tue Wed Thu Fri Sat   72.5 12.5 mg 10 mg 10 mg 10 mg 10 mg 10 mg 10 mg    (5 mg2.5) (5 mg2) (5 mg2) (5 mg2) (5 mg2) (5 mg2) (5 mg2)

## 2012-06-16 ENCOUNTER — Encounter: Payer: BC Managed Care – PPO | Admitting: Family

## 2012-06-18 ENCOUNTER — Ambulatory Visit (INDEPENDENT_AMBULATORY_CARE_PROVIDER_SITE_OTHER): Payer: BC Managed Care – PPO | Admitting: Family

## 2012-06-18 DIAGNOSIS — D6861 Antiphospholipid syndrome: Secondary | ICD-10-CM

## 2012-06-18 DIAGNOSIS — D6859 Other primary thrombophilia: Secondary | ICD-10-CM

## 2012-06-18 DIAGNOSIS — I2699 Other pulmonary embolism without acute cor pulmonale: Secondary | ICD-10-CM

## 2012-06-18 DIAGNOSIS — I82409 Acute embolism and thrombosis of unspecified deep veins of unspecified lower extremity: Secondary | ICD-10-CM

## 2012-06-18 LAB — POCT INR: INR: 2.2

## 2012-06-18 NOTE — Patient Instructions (Addendum)
2 tablets daily except on Sundays take 2.5 tablets. Recheck in 3 weeks.     Latest dosing instructions   Total Sun Mon Tue Wed Thu Fri Sat   72.5 12.5 mg 10 mg 10 mg 10 mg 10 mg 10 mg 10 mg    (5 mg2.5) (5 mg2) (5 mg2) (5 mg2) (5 mg2) (5 mg2) (5 mg2)

## 2012-06-22 ENCOUNTER — Other Ambulatory Visit (INDEPENDENT_AMBULATORY_CARE_PROVIDER_SITE_OTHER): Payer: BC Managed Care – PPO

## 2012-06-22 DIAGNOSIS — Z0000 Encounter for general adult medical examination without abnormal findings: Secondary | ICD-10-CM

## 2012-06-22 LAB — HEPATIC FUNCTION PANEL
Albumin: 3.6 g/dL (ref 3.5–5.2)
Bilirubin, Direct: 0.1 mg/dL (ref 0.0–0.3)
Total Protein: 7.1 g/dL (ref 6.0–8.3)

## 2012-06-22 LAB — CBC WITH DIFFERENTIAL/PLATELET
Basophils Relative: 0.5 % (ref 0.0–3.0)
Eosinophils Relative: 2.9 % (ref 0.0–5.0)
Lymphocytes Relative: 29 % (ref 12.0–46.0)
MCV: 79.5 fl (ref 78.0–100.0)
Monocytes Relative: 5.2 % (ref 3.0–12.0)
Neutrophils Relative %: 62.4 % (ref 43.0–77.0)
RBC: 4.86 Mil/uL (ref 3.87–5.11)
WBC: 3.4 10*3/uL — ABNORMAL LOW (ref 4.5–10.5)

## 2012-06-22 LAB — BASIC METABOLIC PANEL
Calcium: 8.8 mg/dL (ref 8.4–10.5)
Chloride: 108 mEq/L (ref 96–112)
Creatinine, Ser: 0.9 mg/dL (ref 0.4–1.2)
GFR: 84.33 mL/min (ref >60.00)

## 2012-06-22 LAB — LIPID PANEL
Cholesterol: 155 mg/dL (ref 0–200)
LDL Cholesterol: 89 mg/dL (ref 0–99)
Triglycerides: 88 mg/dL (ref 0.0–149.0)

## 2012-07-05 ENCOUNTER — Encounter: Payer: Self-pay | Admitting: Internal Medicine

## 2012-07-05 ENCOUNTER — Ambulatory Visit (INDEPENDENT_AMBULATORY_CARE_PROVIDER_SITE_OTHER): Payer: BC Managed Care – PPO | Admitting: Family

## 2012-07-05 ENCOUNTER — Ambulatory Visit (INDEPENDENT_AMBULATORY_CARE_PROVIDER_SITE_OTHER): Payer: BC Managed Care – PPO | Admitting: Internal Medicine

## 2012-07-05 ENCOUNTER — Other Ambulatory Visit (HOSPITAL_COMMUNITY)
Admission: RE | Admit: 2012-07-05 | Discharge: 2012-07-05 | Disposition: A | Discharge status: Home / Self Care | Payer: BC Managed Care – PPO | Source: Ambulatory Visit | Attending: Internal Medicine | Admitting: Internal Medicine

## 2012-07-05 VITALS — BP 122/86 | HR 71 | Temp 98.1°F | Ht 64.5 in | Wt 213.0 lb

## 2012-07-05 DIAGNOSIS — Z6836 Body mass index (BMI) 36.0-36.9, adult: Secondary | ICD-10-CM

## 2012-07-05 DIAGNOSIS — Z7901 Long term (current) use of anticoagulants: Secondary | ICD-10-CM

## 2012-07-05 DIAGNOSIS — D6861 Antiphospholipid syndrome: Secondary | ICD-10-CM

## 2012-07-05 DIAGNOSIS — N9089 Other specified noninflammatory disorders of vulva and perineum: Secondary | ICD-10-CM

## 2012-07-05 DIAGNOSIS — N951 Menopausal and female climacteric states: Secondary | ICD-10-CM

## 2012-07-05 DIAGNOSIS — D6859 Other primary thrombophilia: Secondary | ICD-10-CM

## 2012-07-05 DIAGNOSIS — Z01419 Encounter for gynecological examination (general) (routine) without abnormal findings: Secondary | ICD-10-CM | POA: Insufficient documentation

## 2012-07-05 DIAGNOSIS — I82409 Acute embolism and thrombosis of unspecified deep veins of unspecified lower extremity: Secondary | ICD-10-CM

## 2012-07-05 DIAGNOSIS — I2699 Other pulmonary embolism without acute cor pulmonale: Secondary | ICD-10-CM

## 2012-07-05 DIAGNOSIS — Z0000 Encounter for general adult medical examination without abnormal findings: Secondary | ICD-10-CM

## 2012-07-05 DIAGNOSIS — Z1151 Encounter for screening for human papillomavirus (HPV): Secondary | ICD-10-CM | POA: Insufficient documentation

## 2012-07-05 DIAGNOSIS — Z23 Encounter for immunization: Secondary | ICD-10-CM

## 2012-07-05 NOTE — Progress Notes (Signed)
Chief Complaint  Patient presents with  . Annual Exam    HPI: Patient comes in today for Preventive Health Care visit  No major change in health status since last visit .  Working stressful   45 hours  Gets  Sleep not a lot of exercise   Neg ta  [employer].  And sweet tea.   On e day of vaginal bleed about 2 x per year nothing significant . ocass hot flushes    hasn't had mammo in a while  Time constraints .  Has "skin tags" in the last year over the vaginal area  Not bleeding ? Removal    On ref anticoagulation without sig bleeding .  Just moved and plans on more exercise fo health reasons ROS:  GEN/ HEENT: No fever, significant weight changes sweats headaches vision problems hearing changes, CV/ PULM; No chest pain shortness of breath cough, syncope,edema  change in exercise tolerance. GI /GU: No adominal pain, vomiting, change in bowel habits. No blood in the stool. No significant GU symptoms. SKIN/HEME: ,no acute skin rashes suspicious lesions or bleeding. No lymphadenopathy, nodules, masses.  NEURO/ PSYCH:  No neurologic signs such as weakness numbness. No depression anxiety. IMM/ Allergy: No unusual infections.  Allergy .   REST of 12 system review negative except as per HPI   Past Medical History  Diagnosis Date  . Deep venous thrombosis     2006    3 x clotting   . Pulmonary embolism   . PE (pulmonary embolism) 07/09/2010  . HEMORRHOIDS, WITH BLEEDING 12/26/2008    Qualifier: Diagnosis of  By: Fabian Sharp MD, Neta Mends   . ABNORMAL VAGINAL BLEEDING 04/19/2007    Qualifier: Diagnosis of  By: Lawernce Ion, CMA (AAMA), Bethann Berkshire     Family History  Problem Relation Age of Onset  . Lung cancer Father     april 12   . Cancer - Other      pancreatic in 2   . Hypertension Mother   . Rheum arthritis Mother     History   Social History  . Marital Status: Married    Spouse Name: N/A    Number of Children: N/A  . Years of Education: N/A   Social History Main Topics  .  Smoking status: Never Smoker   . Smokeless tobacco: None  . Alcohol Use: No  . Drug Use: No  . Sexually Active:    Other Topics Concern  . None   Social History Narrative   HH of 3  Daughter 102 and self.  hh of 2    No pets.   No ets.   No etoh.    Working for [employer] .  45- 50 hours per week.                 Outpatient Encounter Prescriptions as of 07/05/2012  Medication Sig Dispense Refill  . [facility] STOOL SOFTENER 100 MG capsule TAKE 1 CAPSULE BY MOUTH AT BEDTIME AS NEEDED  30 capsule  0  . warfarin (COUMADIN) 5 MG tablet       . [DISCONTINUED] warfarin (COUMADIN) 5 MG tablet TAKE 2 TABLETS DAILY ON MON/TUES/WED/FRI/SAT AND 2 & ONE-HALF TABLETS DAILY ON THURS/SUN  100 tablet  0   No facility-administered encounter medications on file as of 07/05/2012.    EXAM:  BP 122/86  Pulse 71  Temp(Src) 98.1 F (36.7 C) (Oral)  Ht 5' 4.5" (1.638 m)  Wt 213 lb (96.616 kg)  BMI 36.01 kg/m2  SpO2 98%  Body mass index is 36.01 kg/(m^2).  Physical Exam: Vital signs reviewed HQI:ONGE is a well-developed well-nourished alert cooperative   female who appears her stated age in no acute distress.  HEENT: normocephalic atraumatic , Eyes: PERRL EOM's full, conjunctiva clear, Nares: paten,t no deformity discharge or tenderness., Ears: no deformity EAC's clear TMs with normal landmarks. Mouth: clear OP, no lesions, edema.  Moist mucous membranes. Dentition in adequate repair. NECK: supple without masses, thyromegaly or bruits. CHEST/PULM:  Clear to auscultation and percussion breath sounds equal no wheeze , rales or rhonchi. No chest wall deformities or tenderness. CV: PMI is nondisplaced, S1 S2 no gallops, murmurs, rubs. Peripheral pulses are full without delay.No JVD .  Breast: normal by inspection . No dimpling, discharge, masses, tenderness or discharge. ABDOMEN: Bowel sounds normal nontender  No guard or rebound, no hepato splenomegal no CVA tenderness.  No  hernia. Extremtities:  No clubbing cyanosis or edema, no acute joint swelling or redness no focal atrophy NEURO:  Oriented x3, cranial nerves 3-12 appear to be intact, no obvious focal weakness,gait within normal limits no abnormal reflexes or asymmetrical SKIN: No acute rashes normal turgor, color, no bruising or petechiae. PSYCH: Oriented, good eye contact, no obvious depression anxiety, cognition and judgment appear normal. LN: no cervical axillary inguinal adenopathy Pelvic: NL ext GU, labia vulva  Left perineum about 6 [place] like lesions  And on right 2 lesions   . Vagina no lesions .Cervix: clear  UTERUS:  Seems full poss nelarged Neg CMT Adnexa:  clear no masses . Uterus feels enlarged hard to tell with large abd wall.  PAP done Rectal neg masses   stool sm negative for blood    Lab Results  Component Value Date   WBC 3.4* 06/22/2012   HGB 12.9 06/22/2012   HCT 38.6 06/22/2012   PLT 256.0 06/22/2012   GLUCOSE 90 06/22/2012   CHOL 155 06/22/2012   TRIG 88.0 06/22/2012   HDL 48.60 06/22/2012   LDLCALC 89 06/22/2012   ALT 18 06/22/2012   AST 18 06/22/2012   NA 138 06/22/2012   K 4.1 06/22/2012   CL 108 06/22/2012   CREATININE 0.9 06/22/2012   BUN 12 06/22/2012   CO2 27 06/22/2012   TSH 1.11 06/22/2012   INR 1.3 07/05/2012    ASSESSMENT AND PLAN:  Discussed the following assessment and plan:  Visit for preventive health examination - needs mammo utd otherwise  tdap today  - Plan: PAP [[place]]  Skin tag of vulva  multiple  - Plan: Ambulatory referral to Dermatology  Anticoagulant long-term use  Antiphospholipid antibody syndrome  Visit for gynecologic examination - fibroids ?  hx of same pap done - Plan: PAP [[place]]  Need for Tdap vaccination - Plan: Tdap vaccine greater than or equal to 7yo IM  BMI 36.0-36.9,adult  Perimenopausal  Patient Care Team: Madelin Headings, MD as PCP - General (Internal Medicine) Patient Instructions  Get  mammogram  tdap today  Will  arrange a dermatology appt about the  Skin areas we discussed . Add exercise  Limit avoid sweet beverages such  a s sweet tea and soda  Associated with increase risk of Cardiovascular events.  Will notify you  of PAP when availab  Preventive Care for Adults, Female A healthy lifestyle and preventive care can promote health and wellness. Preventive health guidelines for women include the following key practices.  A routine yearly physical is a good way  to check with your caregiver about your health and preventive screening. It is a chance to share any concerns and updates on your health, and to receive a thorough exam.  Visit your dentist for a routine exam and preventive care every 6 months. Brush your teeth twice a day and floss once a day. Good oral hygiene prevents tooth decay and gum disease.  The frequency of eye exams is based on your age, health, family medical history, use of contact lenses, and other factors. Follow your caregiver's recommendations for frequency of eye exams.  Eat a healthy diet. Foods like vegetables, fruits, whole grains, low-fat dairy products, and lean protein foods contain the nutrients you need without too many calories. Decrease your intake of foods high in solid fats, added sugars, and salt. Eat the right amount of calories for you.Get information about a proper diet from your caregiver, if necessary.  Regular physical exercise is one of the most important things you can do for your health. Most adults should get at least 150 minutes of moderate-intensity exercise (any activity that increases your heart rate and causes you to sweat) each week. In addition, most adults need muscle-strengthening exercises on 2 or more days a week.  Maintain a healthy weight. The body mass index (BMI) is a screening tool to identify possible weight problems. It provides an estimate of body fat based on height and weight. Your caregiver can help determine your BMI, and can help you  achieve or maintain a healthy weight.For adults 20 years and older:  A BMI below 18.5 is considered underweight.  A BMI of 18.5 to 24.9 is normal.  A BMI of 25 to 29.9 is considered overweight.  A BMI of 30 and above is considered obese.  Maintain normal blood lipids and cholesterol levels by exercising and minimizing your intake of saturated fat. Eat a balanced diet with plenty of fruit and vegetables. Blood tests for lipids and cholesterol should begin at age 44 and be repeated every 5 years. If your lipid or cholesterol levels are high, you are over 50, or you are at high risk for heart disease, you may need your cholesterol levels checked more frequently.Ongoing high lipid and cholesterol levels should be treated with medicines if diet and exercise are not effective.  If you smoke, find out from your caregiver how to quit. If you do not use tobacco, do not start.  If you are pregnant, do not drink alcohol. If you are breastfeeding, be very cautious about drinking alcohol. If you are not pregnant and choose to drink alcohol, do not exceed 1 drink per day. One drink is considered to be 12 ounces (355 mL) of beer, 5 ounces (148 mL) of wine, or 1.5 ounces (44 mL) of liquor.  Avoid use of street drugs. Do not share needles with anyone. Ask for help if you need support or instructions about stopping the use of drugs.  High blood pressure causes heart disease and increases the risk of stroke. Your blood pressure should be checked at least every 1 to 2 years. Ongoing high blood pressure should be treated with medicines if weight loss and exercise are not effective.  If you are 56 to 53 years old, ask your caregiver if you should take aspirin to prevent strokes.  Diabetes screening involves taking a blood sample to check your fasting blood sugar level. This should be done once every 3 years, after age 107, if you are within normal weight and without risk factors for  diabetes. Testing should be  considered at a younger age or be carried out more frequently if you are overweight and have at least 1 risk factor for diabetes.  Breast cancer screening is essential preventive care for women. You should practice "breast self-awareness." This means understanding the normal appearance and feel of your breasts and may include breast self-examination. Any changes detected, no matter how small, should be reported to a caregiver. Women in their 20s and 30s should have a clinical breast exam (CBE) by a caregiver as part of a regular health exam every 1 to 3 years. After age 41, women should have a CBE every year. Starting at age 85, women should consider having a mammography (breast X-ray test) every year. Women who have a family history of breast cancer should talk to their caregiver about genetic screening. Women at a high risk of breast cancer should talk to their caregivers about having magnetic resonance imaging (MRI) and a mammography every year.  The Pap test is a screening test for cervical cancer. A Pap test can show cell changes on the cervix that might become cervical cancer if left untreated. A Pap test is a procedure in which cells are obtained and examined from the lower end of the uterus (cervix).  Women should have a Pap test starting at age 32.  Between ages 90 and 71, Pap tests should be repeated every 2 years.  Beginning at age 44, you should have a Pap test every 3 years as long as the past 3 Pap tests have been normal.  Some women have medical problems that increase the chance of getting cervical cancer. Talk to your caregiver about these problems. It is especially important to talk to your caregiver if a new problem develops soon after your last Pap test. In these cases, your caregiver may recommend more frequent screening and Pap tests.  The above recommendations are the same for women who have or have not gotten the vaccine for human papillomavirus (HPV).  If you had a  hysterectomy for a problem that was not cancer or a condition that could lead to cancer, then you no longer need Pap tests. Even if you no longer need a Pap test, a regular exam is a good idea to make sure no other problems are starting.  If you are between ages 40 and 41, and you have had normal Pap tests going back 10 years, you no longer need Pap tests. Even if you no longer need a Pap test, a regular exam is a good idea to make sure no other problems are starting.  If you have had past treatment for cervical cancer or a condition that could lead to cancer, you need Pap tests and screening for cancer for at least 20 years after your treatment.  If Pap tests have been discontinued, risk factors (such as a new sexual partner) need to be reassessed to determine if screening should be resumed.  The HPV test is an additional test that may be used for cervical cancer screening. The HPV test looks for the virus that can cause the cell changes on the cervix. The cells collected during the Pap test can be tested for HPV. The HPV test could be used to screen women aged 81 years and older, and should be used in women of any age who have unclear Pap test results. After the age of 73, women should have HPV testing at the same frequency as a Pap test.  Colorectal cancer can  be detected and often prevented. Most routine colorectal cancer screening begins at the age of 46 and continues through age 34. However, your caregiver may recommend screening at an earlier age if you have risk factors for colon cancer. On a yearly basis, your caregiver may provide home test kits to check for hidden blood in the stool. Use of a small camera at the end of a tube, to directly examine the colon (sigmoidoscopy or colonoscopy), can detect the earliest forms of colorectal cancer. Talk to your caregiver about this at age 42, when routine screening begins. Direct examination of the colon should be repeated every 5 to 10 years through age  22, unless early forms of pre-cancerous polyps or small growths are found.  Hepatitis C blood testing is recommended for all people born from [birth year] through 1965 and any individual with known risks for hepatitis C.  Practice safe sex. Use condoms and avoid high-risk sexual practices to reduce the spread of sexually transmitted infections (STIs). STIs include gonorrhea, chlamydia, syphilis, trichomonas, herpes, HPV, and human immunodeficiency virus (HIV). Herpes, HIV, and HPV are viral illnesses that have no cure. They can result in disability, cancer, and death. Sexually active women aged 37 and younger should be checked for chlamydia. Older women with new or multiple partners should also be tested for chlamydia. Testing for other STIs is recommended if you are sexually active and at increased risk.  Osteoporosis is a disease in which the bones lose minerals and strength with aging. This can result in serious bone fractures. The risk of osteoporosis can be identified using a bone density scan. Women ages 64 and over and women at risk for fractures or osteoporosis should discuss screening with their caregivers. Ask your caregiver whether you should take a calcium supplement or vitamin D to reduce the rate of osteoporosis.  Menopause can be associated with physical symptoms and risks. Hormone replacement therapy is available to decrease symptoms and risks. You should talk to your caregiver about whether hormone replacement therapy is right for you.  Use sunscreen with sun protection factor (SPF) of 30 or more. Apply sunscreen liberally and repeatedly throughout the day. You should seek shade when your shadow is shorter than you. Protect yourself by wearing long sleeves, pants, a wide-brimmed hat, and sunglasses year round, whenever you are outdoors.  Once a month, do a whole body skin exam, using a mirror to look at the skin on your back. Notify your caregiver of new moles, moles that have irregular borders,  moles that are larger than a pencil eraser, or moles that have changed in shape or color.  Stay current with required immunizations.  Influenza. You need a dose every fall (or winter). The composition of the flu vaccine changes each year, so being vaccinated once is not enough.  Pneumococcal polysaccharide. You need 1 to 2 doses if you smoke cigarettes or if you have certain chronic medical conditions. You need 1 dose at age 17 (or older) if you have never been vaccinated.  Tetanus, diphtheria, pertussis (Tdap, Td). Get 1 dose of Tdap vaccine if you are younger than age 78, are over 75 and have contact with an infant, are a Research scientist (physical sciences), are pregnant, or simply want to be protected from whooping cough. After that, you need a Td booster dose every 10 years. Consult your caregiver if you have not had at least 3 tetanus and diphtheria-containing shots sometime in your life or have a deep or dirty wound.  HPV. You need  this vaccine if you are a woman age 97 or younger. The vaccine is given in 3 doses over 6 months.  Measles, mumps, rubella (MMR). You need at least 1 dose of MMR if you were born in 1957 or later. You may also need a second dose.  Meningococcal. If you are age 65 to 27 and a first-year college student living in a residence hall, or have one of several medical conditions, you need to get vaccinated against meningococcal disease. You may also need additional booster doses.  Zoster (shingles). If you are age 41 or older, you should get this vaccine.  Varicella (chickenpox). If you have never had chickenpox or you were vaccinated but received only 1 dose, talk to your caregiver to find out if you need this vaccine.  Hepatitis A. You need this vaccine if you have a specific risk factor for hepatitis A virus infection or you simply wish to be protected from this disease. The vaccine is usually given as 2 doses, 6 to 18 months apart.  Hepatitis B. You need this vaccine if you have a  specific risk factor for hepatitis B virus infection or you simply wish to be protected from this disease. The vaccine is given in 3 doses, usually over 6 months. Preventive Services / Frequency Ages 30 to 54  Blood pressure check.** / Every 1 to 2 years.  Lipid and cholesterol check.** / Every 5 years beginning at age 64.  Clinical breast exam.** / Every 3 years for women in their 35s and 30s.  Pap test.** / Every 2 years from ages 25 through 77. Every 3 years starting at age 59 through age 28 or 102 with a history of 3 consecutive normal Pap tests.  HPV screening.** / Every 3 years from ages 16 through ages 70 to 40 with a history of 3 consecutive normal Pap tests.  Hepatitis C blood test.** / For any individual with known risks for hepatitis C.  Skin self-exam. / Monthly.  Influenza immunization.** / Every year.  Pneumococcal polysaccharide immunization.** / 1 to 2 doses if you smoke cigarettes or if you have certain chronic medical conditions.  Tetanus, diphtheria, pertussis (Tdap, Td) immunization. / A one-time dose of Tdap vaccine. After that, you need a Td booster dose every 10 years.  HPV immunization. / 3 doses over 6 months, if you are 15 and younger.  Measles, mumps, rubella (MMR) immunization. / You need at least 1 dose of MMR if you were born in 1957 or later. You may also need a second dose.  Meningococcal immunization. / 1 dose if you are age 63 to 20 and a first-year college student living in a residence hall, or have one of several medical conditions, you need to get vaccinated against meningococcal disease. You may also need additional booster doses.  Varicella immunization.** / Consult your caregiver.  Hepatitis A immunization.** / Consult your caregiver. 2 doses, 6 to 18 months apart.  Hepatitis B immunization.** / Consult your caregiver. 3 doses usually over 6 months. Ages 22 to 80  Blood pressure check.** / Every 1 to 2 years.  Lipid and cholesterol  check.** / Every 5 years beginning at age 3.  Clinical breast exam.** / Every year after age 59.  Mammogram.** / Every year beginning at age 29 and continuing for as long as you are in good health. Consult with your caregiver.  Pap test.** / Every 3 years starting at age 34 through age 60 or 60 with a history  of 3 consecutive normal Pap tests.  HPV screening.** / Every 3 years from ages 57 through ages 3 to 72 with a history of 3 consecutive normal Pap tests.  Fecal occult blood test (FOBT) of stool. / Every year beginning at age 2 and continuing until age 47. You may not need to do this test if you get a colonoscopy every 10 years.  Flexible sigmoidoscopy or colonoscopy.** / Every 5 years for a flexible sigmoidoscopy or every 10 years for a colonoscopy beginning at age 27 and continuing until age 35.  Hepatitis C blood test.** / For all people born from [birth year] through 1965 and any individual with known risks for hepatitis C.  Skin self-exam. / Monthly.  Influenza immunization.** / Every year.  Pneumococcal polysaccharide immunization.** / 1 to 2 doses if you smoke cigarettes or if you have certain chronic medical conditions.  Tetanus, diphtheria, pertussis (Tdap, Td) immunization.** / A one-time dose of Tdap vaccine. After that, you need a Td booster dose every 10 years.  Measles, mumps, rubella (MMR) immunization. / You need at least 1 dose of MMR if you were born in 1957 or later. You may also need a second dose.  Varicella immunization.** / Consult your caregiver.  Meningococcal immunization.** / Consult your caregiver.  Hepatitis A immunization.** / Consult your caregiver. 2 doses, 6 to 18 months apart.  Hepatitis B immunization.** / Consult your caregiver. 3 doses, usually over 6 months. Ages 83 and over  Blood pressure check.** / Every 1 to 2 years.  Lipid and cholesterol check.** / Every 5 years beginning at age 82.  Clinical breast exam.** / Every year after age  37.  Mammogram.** / Every year beginning at age 43 and continuing for as long as you are in good health. Consult with your caregiver.  Pap test.** / Every 3 years starting at age 5 through age 47 or 17 with a 3 consecutive normal Pap tests. Testing can be stopped between 65 and 70 with 3 consecutive normal Pap tests and no abnormal Pap or HPV tests in the past 10 years.  HPV screening.** / Every 3 years from ages 74 through ages 81 or 72 with a history of 3 consecutive normal Pap tests. Testing can be stopped between 65 and 70 with 3 consecutive normal Pap tests and no abnormal Pap or HPV tests in the past 10 years.  Fecal occult blood test (FOBT) of stool. / Every year beginning at age 52 and continuing until age 3. You may not need to do this test if you get a colonoscopy every 10 years.  Flexible sigmoidoscopy or colonoscopy.** / Every 5 years for a flexible sigmoidoscopy or every 10 years for a colonoscopy beginning at age 19 and continuing until age 10.  Hepatitis C blood test.** / For all people born from [birth year] through 1966 and any individual with known risks for hepatitis C.  Osteoporosis screening.** / A one-time screening for women ages 68 and over and women at risk for fractures or osteoporosis.  Skin self-exam. / Monthly.  Influenza immunization.** / Every year.  Pneumococcal polysaccharide immunization.** / 1 dose at age 57 (or older) if you have never been vaccinated.  Tetanus, diphtheria, pertussis (Tdap, Td) immunization. / A one-time dose of Tdap vaccine if you are over 65 and have contact with an infant, are a Research scientist (physical sciences), or simply want to be protected from whooping cough. After that, you need a Td booster dose every 10 years.  Varicella immunization.** /  Consult your caregiver.  Meningococcal immunization.** / Consult your caregiver.  Hepatitis A immunization.** / Consult your caregiver. 2 doses, 6 to 18 months apart.  Hepatitis B immunization.** / Check with  your caregiver. 3 doses, usually over 6 months. ** Family history and personal history of risk and conditions may change your caregiver's recommendations. Document Released: 07/08/2001 Document Revised: 08/04/2011 Document Reviewed: 10/07/2010 [facility] Patient Information 2013 [place], [place].     Neta Mends. Ura Yingling M.D.  Health Maintenance  Topic Date Due  . Mammogram  04/09/2012  . Influenza Vaccine  01/24/2013  . Pap Smear  07/06/2015  . Colonoscopy  04/26/2019  . Tetanus/tdap  07/05/2022   Health Maintenance Review

## 2012-07-05 NOTE — Patient Instructions (Signed)
Get  mammogram  tdap today  Will arrange a dermatology appt about the  Skin areas we discussed . Add exercise  Limit avoid sweet beverages such  a s sweet tea and soda  Associated with increase risk of Cardiovascular events.  Will notify you  of PAP when availab  Preventive Care for Adults, Female A healthy lifestyle and preventive care can promote health and wellness. Preventive health guidelines for women include the following key practices.  A routine yearly physical is a good way to check with your caregiver about your health and preventive screening. It is a chance to share any concerns and updates on your health, and to receive a thorough exam.  Visit your dentist for a routine exam and preventive care every 6 months. Brush your teeth twice a day and floss once a day. Good oral hygiene prevents tooth decay and gum disease.  The frequency of eye exams is based on your age, health, family medical history, use of contact lenses, and other factors. Follow your caregiver's recommendations for frequency of eye exams.  Eat a healthy diet. Foods like vegetables, fruits, whole grains, low-fat dairy products, and lean protein foods contain the nutrients you need without too many calories. Decrease your intake of foods high in solid fats, added sugars, and salt. Eat the right amount of calories for you.Get information about a proper diet from your caregiver, if necessary.  Regular physical exercise is one of the most important things you can do for your health. Most adults should get at least 150 minutes of moderate-intensity exercise (any activity that increases your heart rate and causes you to sweat) each week. In addition, most adults need muscle-strengthening exercises on 2 or more days a week.  Maintain a healthy weight. The body mass index (BMI) is a screening tool to identify possible weight problems. It provides an estimate of body fat based on height and weight. Your caregiver can help  determine your BMI, and can help you achieve or maintain a healthy weight.For adults 20 years and older:  A BMI below 18.5 is considered underweight.  A BMI of 18.5 to 24.9 is normal.  A BMI of 25 to 29.9 is considered overweight.  A BMI of 30 and above is considered obese.  Maintain normal blood lipids and cholesterol levels by exercising and minimizing your intake of saturated fat. Eat a balanced diet with plenty of fruit and vegetables. Blood tests for lipids and cholesterol should begin at age 72 and be repeated every 5 years. If your lipid or cholesterol levels are high, you are over 50, or you are at high risk for heart disease, you may need your cholesterol levels checked more frequently.Ongoing high lipid and cholesterol levels should be treated with medicines if diet and exercise are not effective.  If you smoke, find out from your caregiver how to quit. If you do not use tobacco, do not start.  If you are pregnant, do not drink alcohol. If you are breastfeeding, be very cautious about drinking alcohol. If you are not pregnant and choose to drink alcohol, do not exceed 1 drink per day. One drink is considered to be 12 ounces (355 mL) of beer, 5 ounces (148 mL) of wine, or 1.5 ounces (44 mL) of liquor.  Avoid use of street drugs. Do not share needles with anyone. Ask for help if you need support or instructions about stopping the use of drugs.  High blood pressure causes heart disease and increases the risk of  stroke. Your blood pressure should be checked at least every 1 to 2 years. Ongoing high blood pressure should be treated with medicines if weight loss and exercise are not effective.  If you are 10 to 53 years old, ask your caregiver if you should take aspirin to prevent strokes.  Diabetes screening involves taking a blood sample to check your fasting blood sugar level. This should be done once every 3 years, after age 17, if you are within normal weight and without risk factors  for diabetes. Testing should be considered at a younger age or be carried out more frequently if you are overweight and have at least 1 risk factor for diabetes.  Breast cancer screening is essential preventive care for women. You should practice "breast self-awareness." This means understanding the normal appearance and feel of your breasts and may include breast self-examination. Any changes detected, no matter how small, should be reported to a caregiver. Women in their 32s and 30s should have a clinical breast exam (CBE) by a caregiver as part of a regular health exam every 1 to 3 years. After age 55, women should have a CBE every year. Starting at age 61, women should consider having a mammography (breast X-ray test) every year. Women who have a family history of breast cancer should talk to their caregiver about genetic screening. Women at a high risk of breast cancer should talk to their caregivers about having magnetic resonance imaging (MRI) and a mammography every year.  The Pap test is a screening test for cervical cancer. A Pap test can show cell changes on the cervix that might become cervical cancer if left untreated. A Pap test is a procedure in which cells are obtained and examined from the lower end of the uterus (cervix).  Women should have a Pap test starting at age 58.  Between ages 29 and 46, Pap tests should be repeated every 2 years.  Beginning at age 45, you should have a Pap test every 3 years as long as the past 3 Pap tests have been normal.  Some women have medical problems that increase the chance of getting cervical cancer. Talk to your caregiver about these problems. It is especially important to talk to your caregiver if a new problem develops soon after your last Pap test. In these cases, your caregiver may recommend more frequent screening and Pap tests.  The above recommendations are the same for women who have or have not gotten the vaccine for human papillomavirus  (HPV).  If you had a hysterectomy for a problem that was not cancer or a condition that could lead to cancer, then you no longer need Pap tests. Even if you no longer need a Pap test, a regular exam is a good idea to make sure no other problems are starting.  If you are between ages 90 and 21, and you have had normal Pap tests going back 10 years, you no longer need Pap tests. Even if you no longer need a Pap test, a regular exam is a good idea to make sure no other problems are starting.  If you have had past treatment for cervical cancer or a condition that could lead to cancer, you need Pap tests and screening for cancer for at least 20 years after your treatment.  If Pap tests have been discontinued, risk factors (such as a new sexual partner) need to be reassessed to determine if screening should be resumed.  The HPV test is an additional  test that may be used for cervical cancer screening. The HPV test looks for the virus that can cause the cell changes on the cervix. The cells collected during the Pap test can be tested for HPV. The HPV test could be used to screen women aged 71 years and older, and should be used in women of any age who have unclear Pap test results. After the age of 85, women should have HPV testing at the same frequency as a Pap test.  Colorectal cancer can be detected and often prevented. Most routine colorectal cancer screening begins at the age of 45 and continues through age 69. However, your caregiver may recommend screening at an earlier age if you have risk factors for colon cancer. On a yearly basis, your caregiver may provide home test kits to check for hidden blood in the stool. Use of a small camera at the end of a tube, to directly examine the colon (sigmoidoscopy or colonoscopy), can detect the earliest forms of colorectal cancer. Talk to your caregiver about this at age 34, when routine screening begins. Direct examination of the colon should be repeated every 5  to 10 years through age 75, unless early forms of pre-cancerous polyps or small growths are found.  Hepatitis C blood testing is recommended for all people born from [birth year] through 1965 and any individual with known risks for hepatitis C.  Practice safe sex. Use condoms and avoid high-risk sexual practices to reduce the spread of sexually transmitted infections (STIs). STIs include gonorrhea, chlamydia, syphilis, trichomonas, herpes, HPV, and human immunodeficiency virus (HIV). Herpes, HIV, and HPV are viral illnesses that have no cure. They can result in disability, cancer, and death. Sexually active women aged 42 and younger should be checked for chlamydia. Older women with new or multiple partners should also be tested for chlamydia. Testing for other STIs is recommended if you are sexually active and at increased risk.  Osteoporosis is a disease in which the bones lose minerals and strength with aging. This can result in serious bone fractures. The risk of osteoporosis can be identified using a bone density scan. Women ages 73 and over and women at risk for fractures or osteoporosis should discuss screening with their caregivers. Ask your caregiver whether you should take a calcium supplement or vitamin D to reduce the rate of osteoporosis.  Menopause can be associated with physical symptoms and risks. Hormone replacement therapy is available to decrease symptoms and risks. You should talk to your caregiver about whether hormone replacement therapy is right for you.  Use sunscreen with sun protection factor (SPF) of 30 or more. Apply sunscreen liberally and repeatedly throughout the day. You should seek shade when your shadow is shorter than you. Protect yourself by wearing long sleeves, pants, a wide-brimmed hat, and sunglasses year round, whenever you are outdoors.  Once a month, do a whole body skin exam, using a mirror to look at the skin on your back. Notify your caregiver of new moles, moles that  have irregular borders, moles that are larger than a pencil eraser, or moles that have changed in shape or color.  Stay current with required immunizations.  Influenza. You need a dose every fall (or winter). The composition of the flu vaccine changes each year, so being vaccinated once is not enough.  Pneumococcal polysaccharide. You need 1 to 2 doses if you smoke cigarettes or if you have certain chronic medical conditions. You need 1 dose at age 52 (or older) if you  have never been vaccinated.  Tetanus, diphtheria, pertussis (Tdap, Td). Get 1 dose of Tdap vaccine if you are younger than age 82, are over 71 and have contact with an infant, are a Research scientist (physical sciences), are pregnant, or simply want to be protected from whooping cough. After that, you need a Td booster dose every 10 years. Consult your caregiver if you have not had at least 3 tetanus and diphtheria-containing shots sometime in your life or have a deep or dirty wound.  HPV. You need this vaccine if you are a woman age 110 or younger. The vaccine is given in 3 doses over 6 months.  Measles, mumps, rubella (MMR). You need at least 1 dose of MMR if you were born in 1957 or later. You may also need a second dose.  Meningococcal. If you are age 50 to 3 and a first-year college student living in a residence hall, or have one of several medical conditions, you need to get vaccinated against meningococcal disease. You may also need additional booster doses.  Zoster (shingles). If you are age 30 or older, you should get this vaccine.  Varicella (chickenpox). If you have never had chickenpox or you were vaccinated but received only 1 dose, talk to your caregiver to find out if you need this vaccine.  Hepatitis A. You need this vaccine if you have a specific risk factor for hepatitis A virus infection or you simply wish to be protected from this disease. The vaccine is usually given as 2 doses, 6 to 18 months apart.  Hepatitis B. You need this  vaccine if you have a specific risk factor for hepatitis B virus infection or you simply wish to be protected from this disease. The vaccine is given in 3 doses, usually over 6 months. Preventive Services / Frequency Ages 52 to 31  Blood pressure check.** / Every 1 to 2 years.  Lipid and cholesterol check.** / Every 5 years beginning at age 74.  Clinical breast exam.** / Every 3 years for women in their 53s and 30s.  Pap test.** / Every 2 years from ages 70 through 36. Every 3 years starting at age 1 through age 35 or 89 with a history of 3 consecutive normal Pap tests.  HPV screening.** / Every 3 years from ages 96 through ages 58 to 83 with a history of 3 consecutive normal Pap tests.  Hepatitis C blood test.** / For any individual with known risks for hepatitis C.  Skin self-exam. / Monthly.  Influenza immunization.** / Every year.  Pneumococcal polysaccharide immunization.** / 1 to 2 doses if you smoke cigarettes or if you have certain chronic medical conditions.  Tetanus, diphtheria, pertussis (Tdap, Td) immunization. / A one-time dose of Tdap vaccine. After that, you need a Td booster dose every 10 years.  HPV immunization. / 3 doses over 6 months, if you are 87 and younger.  Measles, mumps, rubella (MMR) immunization. / You need at least 1 dose of MMR if you were born in 1957 or later. You may also need a second dose.  Meningococcal immunization. / 1 dose if you are age 72 to 39 and a first-year college student living in a residence hall, or have one of several medical conditions, you need to get vaccinated against meningococcal disease. You may also need additional booster doses.  Varicella immunization.** / Consult your caregiver.  Hepatitis A immunization.** / Consult your caregiver. 2 doses, 6 to 18 months apart.  Hepatitis B immunization.** / Consult your  caregiver. 3 doses usually over 6 months. Ages 76 to 13  Blood pressure check.** / Every 1 to 2 years.  Lipid  and cholesterol check.** / Every 5 years beginning at age 39.  Clinical breast exam.** / Every year after age 50.  Mammogram.** / Every year beginning at age 71 and continuing for as long as you are in good health. Consult with your caregiver.  Pap test.** / Every 3 years starting at age 88 through age 7 or 56 with a history of 3 consecutive normal Pap tests.  HPV screening.** / Every 3 years from ages 56 through ages 35 to 59 with a history of 3 consecutive normal Pap tests.  Fecal occult blood test (FOBT) of stool. / Every year beginning at age 62 and continuing until age 49. You may not need to do this test if you get a colonoscopy every 10 years.  Flexible sigmoidoscopy or colonoscopy.** / Every 5 years for a flexible sigmoidoscopy or every 10 years for a colonoscopy beginning at age 65 and continuing until age 25.  Hepatitis C blood test.** / For all people born from [birth year] through 1966 and any individual with known risks for hepatitis C.  Skin self-exam. / Monthly.  Influenza immunization.** / Every year.  Pneumococcal polysaccharide immunization.** / 1 to 2 doses if you smoke cigarettes or if you have certain chronic medical conditions.  Tetanus, diphtheria, pertussis (Tdap, Td) immunization.** / A one-time dose of Tdap vaccine. After that, you need a Td booster dose every 10 years.  Measles, mumps, rubella (MMR) immunization. / You need at least 1 dose of MMR if you were born in 1957 or later. You may also need a second dose.  Varicella immunization.** / Consult your caregiver.  Meningococcal immunization.** / Consult your caregiver.  Hepatitis A immunization.** / Consult your caregiver. 2 doses, 6 to 18 months apart.  Hepatitis B immunization.** / Consult your caregiver. 3 doses, usually over 6 months. Ages 4 and over  Blood pressure check.** / Every 1 to 2 years.  Lipid and cholesterol check.** / Every 5 years beginning at age 20.  Clinical breast exam.** / Every year  after age 68.  Mammogram.** / Every year beginning at age 20 and continuing for as long as you are in good health. Consult with your caregiver.  Pap test.** / Every 3 years starting at age 56 through age 43 or 1 with a 3 consecutive normal Pap tests. Testing can be stopped between 65 and 70 with 3 consecutive normal Pap tests and no abnormal Pap or HPV tests in the past 10 years.  HPV screening.** / Every 3 years from ages 58 through ages 52 or 32 with a history of 3 consecutive normal Pap tests. Testing can be stopped between 65 and 70 with 3 consecutive normal Pap tests and no abnormal Pap or HPV tests in the past 10 years.  Fecal occult blood test (FOBT) of stool. / Every year beginning at age 13 and continuing until age 77. You may not need to do this test if you get a colonoscopy every 10 years.  Flexible sigmoidoscopy or colonoscopy.** / Every 5 years for a flexible sigmoidoscopy or every 10 years for a colonoscopy beginning at age 29 and continuing until age 83.  Hepatitis C blood test.** / For all people born from [birth year] through 1966 and any individual with known risks for hepatitis C.  Osteoporosis screening.** / A one-time screening for women ages 38 and over and women  at risk for fractures or osteoporosis.  Skin self-exam. / Monthly.  Influenza immunization.** / Every year.  Pneumococcal polysaccharide immunization.** / 1 dose at age 12 (or older) if you have never been vaccinated.  Tetanus, diphtheria, pertussis (Tdap, Td) immunization. / A one-time dose of Tdap vaccine if you are over 65 and have contact with an infant, are a Research scientist (physical sciences), or simply want to be protected from whooping cough. After that, you need a Td booster dose every 10 years.  Varicella immunization.** / Consult your caregiver.  Meningococcal immunization.** / Consult your caregiver.  Hepatitis A immunization.** / Consult your caregiver. 2 doses, 6 to 18 months apart.  Hepatitis B immunization.** /  Check with your caregiver. 3 doses, usually over 6 months. ** Family history and personal history of risk and conditions may change your caregiver's recommendations. Document Released: 07/08/2001 Document Revised: 08/04/2011 Document Reviewed: 10/07/2010 [facility] Patient Information 2013 [place], [place].

## 2012-07-05 NOTE — Patient Instructions (Addendum)
Take and extra 2.5mg  today and tomorrow only. Then continue 2 tablets daily except on Sundays take 2.5 tablets. Recheck in 2 weeks.   Anticoagulation Dose Instructions as of 07/05/2012     Theresa Lewis Tue Wed Thu Fri Sat   New Dose 12.5 mg 10 mg 10 mg 10 mg 10 mg 10 mg 10 mg    Description         Take and extra 2.5mg  today and tomorrow only. Then continue 2 tablets daily except on Sundays take 2.5 tablets. Recheck in 2 weeks.

## 2012-07-08 DIAGNOSIS — Z6836 Body mass index (BMI) 36.0-36.9, adult: Secondary | ICD-10-CM | POA: Insufficient documentation

## 2012-07-08 DIAGNOSIS — Z01419 Encounter for gynecological examination (general) (routine) without abnormal findings: Secondary | ICD-10-CM | POA: Insufficient documentation

## 2012-07-10 ENCOUNTER — Other Ambulatory Visit: Payer: Self-pay | Admitting: Family

## 2012-07-12 NOTE — Progress Notes (Signed)
Quick Note:  Tell patient PAP is normal. ______ 

## 2012-07-14 NOTE — Progress Notes (Signed)
Quick Note:  Left a message for return call. ______ 

## 2012-07-15 ENCOUNTER — Encounter: Payer: Self-pay | Admitting: Family Medicine

## 2012-07-20 ENCOUNTER — Ambulatory Visit (INDEPENDENT_AMBULATORY_CARE_PROVIDER_SITE_OTHER): Payer: BC Managed Care – PPO | Admitting: Family

## 2012-07-20 DIAGNOSIS — D6859 Other primary thrombophilia: Secondary | ICD-10-CM

## 2012-07-20 DIAGNOSIS — D6861 Antiphospholipid syndrome: Secondary | ICD-10-CM

## 2012-07-20 NOTE — Patient Instructions (Addendum)
Take and extra 2.5mg  today. Then continue 2 tablets daily except on Sundays take 2.5 tablets. Recheck in 3 weeks.   Anticoagulation Dose Instructions as of 07/20/2012     Glynis Smiles Tue Wed Thu Fri Sat   New Dose 12.5 mg 10 mg 10 mg 10 mg 10 mg 10 mg 10 mg    Description         Take and extra 2.5mg  today. Then continue 2 tablets daily except on Sundays take 2.5 tablets. Recheck in 3 weeks.

## 2012-08-10 ENCOUNTER — Encounter: Payer: BC Managed Care – PPO | Admitting: Family

## 2012-08-13 ENCOUNTER — Ambulatory Visit (INDEPENDENT_AMBULATORY_CARE_PROVIDER_SITE_OTHER): Payer: BC Managed Care – PPO | Admitting: Family

## 2012-08-13 DIAGNOSIS — I82409 Acute embolism and thrombosis of unspecified deep veins of unspecified lower extremity: Secondary | ICD-10-CM

## 2012-08-13 DIAGNOSIS — D6861 Antiphospholipid syndrome: Secondary | ICD-10-CM

## 2012-08-13 DIAGNOSIS — I82401 Acute embolism and thrombosis of unspecified deep veins of right lower extremity: Secondary | ICD-10-CM

## 2012-08-13 DIAGNOSIS — D6859 Other primary thrombophilia: Secondary | ICD-10-CM

## 2012-08-13 NOTE — Patient Instructions (Signed)
Hold Coumadin x 2 days, Friday and Saturday. Continue 2 tablets daily except on Sundays take 2.5 tablets. Recheck in 4 weeks.   Anticoagulation Dose Instructions as of 08/13/2012     Glynis Smiles Tue Wed Thu Fri Sat   New Dose 12.5 mg 10 mg 10 mg 10 mg 10 mg 10 mg 10 mg    Description       Hold Coumadin x 2 days, Friday and Saturday. Continue 2 tablets daily except on Sundays take 2.5 tablets. Recheck in 4 weeks.

## 2012-09-15 ENCOUNTER — Ambulatory Visit (INDEPENDENT_AMBULATORY_CARE_PROVIDER_SITE_OTHER): Payer: BC Managed Care – PPO | Admitting: Family

## 2012-09-15 DIAGNOSIS — I82409 Acute embolism and thrombosis of unspecified deep veins of unspecified lower extremity: Secondary | ICD-10-CM

## 2012-09-15 DIAGNOSIS — I82401 Acute embolism and thrombosis of unspecified deep veins of right lower extremity: Secondary | ICD-10-CM

## 2012-09-15 DIAGNOSIS — D6859 Other primary thrombophilia: Secondary | ICD-10-CM

## 2012-09-15 DIAGNOSIS — D6861 Antiphospholipid syndrome: Secondary | ICD-10-CM

## 2012-09-15 NOTE — Patient Instructions (Addendum)
Takje an extra 1/2 tab today. Continue 2 tablets daily except on Sundays take 2.5 tablets. Recheck in 4 weeks.  Anticoagulation Dose Instructions as of 09/15/2012     Glynis Smiles Tue Wed Thu Fri Sat   New Dose 12.5 mg 10 mg 10 mg 10 mg 10 mg 10 mg 10 mg    Description       Continue 2 tablets daily except on Sundays take 2.5 tablets. Recheck in 4 weeks.

## 2012-09-28 ENCOUNTER — Other Ambulatory Visit: Payer: Self-pay | Admitting: Internal Medicine

## 2012-10-06 ENCOUNTER — Encounter: Payer: BC Managed Care – PPO | Admitting: Family

## 2012-10-07 ENCOUNTER — Other Ambulatory Visit (INDEPENDENT_AMBULATORY_CARE_PROVIDER_SITE_OTHER): Payer: BC Managed Care – PPO

## 2012-10-07 DIAGNOSIS — I82409 Acute embolism and thrombosis of unspecified deep veins of unspecified lower extremity: Secondary | ICD-10-CM

## 2012-10-07 LAB — POCT INR: INR: 3.3

## 2012-10-07 NOTE — Patient Instructions (Signed)
Anticoagulation Dose Instructions as of 10/07/2012     Theresa Lewis Tue Wed Thu Fri Sat   New Dose 12.5 mg 10 mg 10 mg 10 mg 10 mg 10 mg 10 mg    Description       Continue 2 tablets daily except on Sundays take 2.5 tablets. Recheck in 4 weeks.

## 2012-10-08 ENCOUNTER — Ambulatory Visit (INDEPENDENT_AMBULATORY_CARE_PROVIDER_SITE_OTHER): Payer: BC Managed Care – PPO | Admitting: Family

## 2012-10-08 DIAGNOSIS — D6859 Other primary thrombophilia: Secondary | ICD-10-CM

## 2012-10-08 DIAGNOSIS — I82401 Acute embolism and thrombosis of unspecified deep veins of right lower extremity: Secondary | ICD-10-CM

## 2012-10-08 DIAGNOSIS — D6861 Antiphospholipid syndrome: Secondary | ICD-10-CM

## 2012-10-08 DIAGNOSIS — I82409 Acute embolism and thrombosis of unspecified deep veins of unspecified lower extremity: Secondary | ICD-10-CM

## 2012-10-08 NOTE — Patient Instructions (Signed)
Continue 2 tablets daily except on Sundays take 2.5 tablets. Recheck in 4 weeks.   Anticoagulation Dose Instructions as of 10/08/2012     Glynis Smiles Tue Wed Thu Fri Sat   New Dose 12.5 mg 10 mg 10 mg 10 mg 10 mg 10 mg 10 mg    Description       Continue 2 tablets daily except on Sundays take 2.5 tablets. Recheck in 4 weeks.

## 2012-10-11 ENCOUNTER — Encounter: Payer: BC Managed Care – PPO | Admitting: Family

## 2012-11-05 ENCOUNTER — Encounter: Payer: BC Managed Care – PPO | Admitting: Family

## 2012-11-08 ENCOUNTER — Encounter: Payer: BC Managed Care – PPO | Admitting: Family

## 2012-11-09 ENCOUNTER — Ambulatory Visit (INDEPENDENT_AMBULATORY_CARE_PROVIDER_SITE_OTHER): Payer: BC Managed Care – PPO | Admitting: Family

## 2012-11-09 DIAGNOSIS — D6861 Antiphospholipid syndrome: Secondary | ICD-10-CM

## 2012-11-09 DIAGNOSIS — D6859 Other primary thrombophilia: Secondary | ICD-10-CM

## 2012-11-09 LAB — POCT INR: INR: 3.4

## 2012-11-09 NOTE — Patient Instructions (Addendum)
Continue 2 tablets daily except on Sundays take 2.5 tablets. Recheck in 4 weeks.  Anticoagulation Dose Instructions as of 11/09/2012     Theresa Lewis Tue Wed Thu Fri Sat   New Dose 12.5 mg 10 mg 10 mg 10 mg 10 mg 10 mg 10 mg    Description       Continue 2 tablets daily except on Sundays take 2.5 tablets. Recheck in 4 weeks.

## 2012-12-11 ENCOUNTER — Other Ambulatory Visit: Payer: Self-pay | Admitting: Family

## 2012-12-13 ENCOUNTER — Encounter: Payer: BC Managed Care – PPO | Admitting: Family

## 2012-12-13 ENCOUNTER — Ambulatory Visit: Payer: BC Managed Care – PPO

## 2012-12-13 ENCOUNTER — Ambulatory Visit (INDEPENDENT_AMBULATORY_CARE_PROVIDER_SITE_OTHER): Payer: BC Managed Care – PPO | Admitting: General Practice

## 2012-12-13 DIAGNOSIS — I82409 Acute embolism and thrombosis of unspecified deep veins of unspecified lower extremity: Secondary | ICD-10-CM

## 2012-12-13 DIAGNOSIS — D6861 Antiphospholipid syndrome: Secondary | ICD-10-CM

## 2012-12-13 DIAGNOSIS — D6859 Other primary thrombophilia: Secondary | ICD-10-CM

## 2013-01-10 ENCOUNTER — Ambulatory Visit (INDEPENDENT_AMBULATORY_CARE_PROVIDER_SITE_OTHER): Payer: BC Managed Care – PPO | Admitting: General Practice

## 2013-01-10 DIAGNOSIS — D6859 Other primary thrombophilia: Secondary | ICD-10-CM

## 2013-01-10 DIAGNOSIS — Z7901 Long term (current) use of anticoagulants: Secondary | ICD-10-CM

## 2013-01-10 DIAGNOSIS — I82409 Acute embolism and thrombosis of unspecified deep veins of unspecified lower extremity: Secondary | ICD-10-CM

## 2013-01-10 DIAGNOSIS — D6861 Antiphospholipid syndrome: Secondary | ICD-10-CM

## 2013-01-10 LAB — POCT INR: INR: 1.8

## 2013-01-27 ENCOUNTER — Other Ambulatory Visit: Payer: Self-pay | Admitting: Family

## 2013-02-07 ENCOUNTER — Ambulatory Visit (INDEPENDENT_AMBULATORY_CARE_PROVIDER_SITE_OTHER): Payer: BC Managed Care – PPO | Admitting: General Practice

## 2013-02-07 DIAGNOSIS — I82409 Acute embolism and thrombosis of unspecified deep veins of unspecified lower extremity: Secondary | ICD-10-CM

## 2013-02-07 DIAGNOSIS — D6861 Antiphospholipid syndrome: Secondary | ICD-10-CM

## 2013-02-07 DIAGNOSIS — D6859 Other primary thrombophilia: Secondary | ICD-10-CM

## 2013-02-07 LAB — POCT INR: INR: 3.3

## 2013-03-07 ENCOUNTER — Ambulatory Visit: Payer: BC Managed Care – PPO

## 2013-03-21 ENCOUNTER — Other Ambulatory Visit: Payer: Self-pay | Admitting: Family

## 2013-03-22 ENCOUNTER — Other Ambulatory Visit: Payer: Self-pay | Admitting: General Practice

## 2013-03-22 MED ORDER — WARFARIN SODIUM 5 MG PO TABS: ORAL_TABLET | ORAL | Status: DC

## 2013-06-07 ENCOUNTER — Telehealth: Payer: Self-pay | Admitting: General Practice

## 2013-06-07 NOTE — Telephone Encounter (Signed)
LMOM for patient to call and make appointment in coumadin clinic.

## 2013-06-13 ENCOUNTER — Ambulatory Visit (INDEPENDENT_AMBULATORY_CARE_PROVIDER_SITE_OTHER): Payer: BC Managed Care – PPO | Admitting: Family Medicine

## 2013-06-13 DIAGNOSIS — I82409 Acute embolism and thrombosis of unspecified deep veins of unspecified lower extremity: Secondary | ICD-10-CM

## 2013-06-13 DIAGNOSIS — D6861 Antiphospholipid syndrome: Secondary | ICD-10-CM

## 2013-06-13 DIAGNOSIS — D6859 Other primary thrombophilia: Secondary | ICD-10-CM

## 2013-06-13 LAB — POCT INR: INR: 5.5

## 2013-07-11 ENCOUNTER — Ambulatory Visit (INDEPENDENT_AMBULATORY_CARE_PROVIDER_SITE_OTHER): Payer: BC Managed Care – PPO | Admitting: Family Medicine

## 2013-07-11 DIAGNOSIS — Z5181 Encounter for therapeutic drug level monitoring: Secondary | ICD-10-CM | POA: Insufficient documentation

## 2013-07-11 DIAGNOSIS — I82409 Acute embolism and thrombosis of unspecified deep veins of unspecified lower extremity: Secondary | ICD-10-CM

## 2013-07-11 DIAGNOSIS — D6859 Other primary thrombophilia: Secondary | ICD-10-CM

## 2013-07-11 DIAGNOSIS — D6861 Antiphospholipid syndrome: Secondary | ICD-10-CM

## 2013-07-11 LAB — POCT INR: INR: 6.4

## 2013-07-14 ENCOUNTER — Ambulatory Visit (INDEPENDENT_AMBULATORY_CARE_PROVIDER_SITE_OTHER): Payer: BC Managed Care – PPO | Admitting: Family Medicine

## 2013-07-14 ENCOUNTER — Ambulatory Visit: Payer: BC Managed Care – PPO

## 2013-07-14 DIAGNOSIS — D6861 Antiphospholipid syndrome: Secondary | ICD-10-CM

## 2013-07-14 DIAGNOSIS — Z5181 Encounter for therapeutic drug level monitoring: Secondary | ICD-10-CM

## 2013-07-14 DIAGNOSIS — I82409 Acute embolism and thrombosis of unspecified deep veins of unspecified lower extremity: Secondary | ICD-10-CM

## 2013-07-14 DIAGNOSIS — D6859 Other primary thrombophilia: Secondary | ICD-10-CM

## 2013-07-14 LAB — POCT INR: INR: 2.4

## 2013-08-03 ENCOUNTER — Other Ambulatory Visit: Payer: Self-pay | Admitting: Internal Medicine

## 2013-08-04 ENCOUNTER — Other Ambulatory Visit: Payer: Self-pay | Admitting: Family Medicine

## 2013-08-04 ENCOUNTER — Telehealth: Payer: Self-pay | Admitting: Family Medicine

## 2013-08-04 MED ORDER — WARFARIN SODIUM 5 MG PO TABS: ORAL_TABLET | ORAL | Status: DC

## 2013-08-04 NOTE — Telephone Encounter (Signed)
Pharmacy called.  They need directions.  This is now a requirement for insurance. Thanks!

## 2013-08-04 NOTE — Telephone Encounter (Signed)
Dr Panosh pt 

## 2013-08-15 ENCOUNTER — Ambulatory Visit (INDEPENDENT_AMBULATORY_CARE_PROVIDER_SITE_OTHER): Payer: BC Managed Care – PPO | Admitting: Family Medicine

## 2013-08-15 DIAGNOSIS — I82409 Acute embolism and thrombosis of unspecified deep veins of unspecified lower extremity: Secondary | ICD-10-CM

## 2013-08-15 DIAGNOSIS — D6859 Other primary thrombophilia: Secondary | ICD-10-CM

## 2013-08-15 DIAGNOSIS — Z5181 Encounter for therapeutic drug level monitoring: Secondary | ICD-10-CM

## 2013-08-15 DIAGNOSIS — D6861 Antiphospholipid syndrome: Secondary | ICD-10-CM

## 2013-08-15 LAB — POCT INR: INR: 3.6

## 2013-09-12 ENCOUNTER — Ambulatory Visit: Payer: BC Managed Care – PPO

## 2013-09-19 ENCOUNTER — Ambulatory Visit: Payer: BC Managed Care – PPO

## 2013-11-03 ENCOUNTER — Ambulatory Visit (INDEPENDENT_AMBULATORY_CARE_PROVIDER_SITE_OTHER): Payer: BC Managed Care – PPO | Admitting: Family Medicine

## 2013-11-03 DIAGNOSIS — I82409 Acute embolism and thrombosis of unspecified deep veins of unspecified lower extremity: Secondary | ICD-10-CM

## 2013-11-03 DIAGNOSIS — Z5181 Encounter for therapeutic drug level monitoring: Secondary | ICD-10-CM

## 2013-11-03 DIAGNOSIS — D6861 Antiphospholipid syndrome: Secondary | ICD-10-CM

## 2013-11-03 DIAGNOSIS — D6859 Other primary thrombophilia: Secondary | ICD-10-CM

## 2013-11-03 LAB — POCT INR: INR: 2.4

## 2013-12-03 ENCOUNTER — Other Ambulatory Visit: Payer: Self-pay | Admitting: Internal Medicine

## 2013-12-15 ENCOUNTER — Ambulatory Visit: Payer: BC Managed Care – PPO

## 2014-01-12 ENCOUNTER — Encounter: Payer: Self-pay | Admitting: Internal Medicine

## 2014-03-17 ENCOUNTER — Other Ambulatory Visit: Payer: Self-pay | Admitting: Internal Medicine

## 2014-03-20 ENCOUNTER — Ambulatory Visit (INDEPENDENT_AMBULATORY_CARE_PROVIDER_SITE_OTHER): Payer: BC Managed Care – PPO | Admitting: *Deleted

## 2014-03-20 ENCOUNTER — Ambulatory Visit: Payer: BC Managed Care – PPO | Admitting: Family

## 2014-03-20 DIAGNOSIS — Z5181 Encounter for therapeutic drug level monitoring: Secondary | ICD-10-CM

## 2014-03-20 DIAGNOSIS — D6861 Antiphospholipid syndrome: Secondary | ICD-10-CM

## 2014-03-20 LAB — POCT INR: INR: 1.8

## 2014-05-01 ENCOUNTER — Other Ambulatory Visit: Payer: BC Managed Care – PPO

## 2014-05-01 ENCOUNTER — Ambulatory Visit: Payer: BC Managed Care – PPO

## 2014-05-19 ENCOUNTER — Other Ambulatory Visit: Payer: Self-pay | Admitting: Family

## 2014-06-14 ENCOUNTER — Telehealth: Payer: Self-pay | Admitting: Family

## 2014-06-14 NOTE — Telephone Encounter (Signed)
lmom for pt to cb

## 2014-06-14 NOTE — Telephone Encounter (Signed)
Patient called back stating she's been going to [facility] for Coumadin check.

## 2014-06-14 NOTE — Telephone Encounter (Signed)
Needs PT/INR visit asap!

## 2014-06-14 NOTE — Telephone Encounter (Signed)
Noted  

## 2014-09-10 ENCOUNTER — Other Ambulatory Visit: Payer: Self-pay | Admitting: Family

## 2014-09-15 ENCOUNTER — Other Ambulatory Visit: Payer: Self-pay | Admitting: Family

## 2014-10-31 ENCOUNTER — Other Ambulatory Visit: Payer: Self-pay | Admitting: Internal Medicine

## 2014-11-03 ENCOUNTER — Telehealth: Payer: Self-pay | Admitting: *Deleted

## 2014-11-03 ENCOUNTER — Other Ambulatory Visit (INDEPENDENT_AMBULATORY_CARE_PROVIDER_SITE_OTHER): Payer: BC Managed Care – PPO

## 2014-11-03 DIAGNOSIS — Z5181 Encounter for therapeutic drug level monitoring: Secondary | ICD-10-CM

## 2014-11-03 LAB — POCT INR: INR: 2.2

## 2014-11-03 MED ORDER — WARFARIN SODIUM 5 MG PO TABS: ORAL_TABLET | ORAL | Status: DC

## 2014-11-03 NOTE — Telephone Encounter (Signed)
I have not seen patient in 2 years. Last  inr here was 6 months ago .  In inr clinic here  I do not have her  Current dose of medication .  Or instructions  No know her current medical status .  except what is in the  EHR ?  Patient needs yearly visit here if we are to continue  prescribing medication    Does she wish to transfer PCP site for convenience?

## 2014-11-03 NOTE — Telephone Encounter (Signed)
Patient was seen at [facility] for INR check today.  INR 2.2, result sent from lab.  Patient was requesting a refill at this time (it was recently denied as she was due for a recheck).  I sent in a 30 day supply.  I will be re-opening the Coumadin clinic at the [facility] office and I will be happy to follow her from this point out for INR checks.  If you are okay with this, please forward me her current dosing instructions and requested follow up date.  I will call the patient to schedule.

## 2014-11-03 NOTE — Telephone Encounter (Signed)
I spoke to Theresa Lewis and explained that she would need to be seen by PCP annually at minimum in order for [place] to continue monitoring her Coumadin.  She agreed to schedule an appointment.  She did mention that she would like to transfer to [facility] and is tentatively scheduled with Allie Bossier, NP on 6/23.  Are you okay with this transfer?

## 2014-11-10 NOTE — Telephone Encounter (Signed)
This is fine  If easier for her . Wishing her well.

## 2014-11-16 ENCOUNTER — Ambulatory Visit (INDEPENDENT_AMBULATORY_CARE_PROVIDER_SITE_OTHER): Payer: BC Managed Care – PPO | Admitting: Primary Care

## 2014-11-16 ENCOUNTER — Encounter: Payer: Self-pay | Admitting: Primary Care

## 2014-11-16 VITALS — BP 108/70 | HR 60 | Temp 97.6°F | Ht 65.0 in | Wt 213.4 lb

## 2014-11-16 DIAGNOSIS — Z7901 Long term (current) use of anticoagulants: Secondary | ICD-10-CM | POA: Diagnosis not present

## 2014-11-16 DIAGNOSIS — I82409 Acute embolism and thrombosis of unspecified deep veins of unspecified lower extremity: Secondary | ICD-10-CM | POA: Diagnosis not present

## 2014-11-16 DIAGNOSIS — R1310 Dysphagia, unspecified: Secondary | ICD-10-CM

## 2014-11-16 DIAGNOSIS — IMO0002 Reserved for concepts with insufficient information to code with codable children: Secondary | ICD-10-CM

## 2014-11-16 MED ORDER — WARFARIN SODIUM 5 MG PO TABS: ORAL_TABLET | ORAL | Status: DC

## 2014-11-16 NOTE — Assessment & Plan Note (Signed)
History of. Managed on Coumadin for prevention. Manages with our coumadin clinic and last INR 2.2

## 2014-11-16 NOTE — Progress Notes (Signed)
Subjective:    Patient ID: Theresa Lewis, female    DOB: 03/11/1961, 54 y.o.   MRN: [id number]  HPI  Theresa Lewis is a 54 year old female who presents today to establish care and discuss the problems mentioned below. Will review old records.  1) Difficulty swallowing: Difficulty swallowing and will get food lodged in her throat that has been present for 2 months. Meats are most difficult to swallow. Last Tuesday she choked on a country ham biscuit and had to have someone to perform the heimleick maneuver. She's never had an endoscopy. She has a history of acid reflux and has been taking tums and chewing gum. She doesn't feel symptoms of reflux everyday and will require one tums weekly on average. She is aware of the GERD triggers.  2) Shoulder pain: Present to right shoulder (with moving her shoulder towards her back) that has been present for one month. Her pain first began when she was in her car one month ago. She was holding something in her right hand, she twisted back with her shoulder to place the item in the back seat. She has been taking ibuprofen (400-600mg  tablets) and resting her shoulder which will help to relieve her pain. Overall her pain has improved.   Review of Systems  Constitutional: Negative for unexpected weight change.  HENT: Negative for rhinorrhea.   Respiratory: Negative for cough and shortness of breath.   Cardiovascular: Negative for chest pain.  Gastrointestinal: Negative for diarrhea and constipation.       Difficulty swallowing.  Genitourinary: Negative for difficulty urinating.  Musculoskeletal: Positive for arthralgias.  Skin: Negative for rash.  Allergic/Immunologic: Negative for environmental allergies.  Neurological: Negative for dizziness and headaches.  Psychiatric/Behavioral:       Denies concerns for anxiety or depression.       Past Medical History  Diagnosis Date  . Deep venous thrombosis     2006    3 x clotting   . Pulmonary embolism     . PE (pulmonary embolism) 07/09/2010  . HEMORRHOIDS, WITH BLEEDING 12/26/2008    Qualifier: Diagnosis of  By: Regis Bill MD, [place] VAGINAL BLEEDING 04/19/2007    Qualifier: Diagnosis of  By: Hulan Saas, CMA (AAMA), Quita Skye     History   Social History  . Marital Status: Married    Spouse Name: N/A  . Number of Children: N/A  . Years of Education: N/A   Occupational History  . Not on file.   Social History Main Topics  . Smoking status: Never Smoker   . Smokeless tobacco: Not on file  . Alcohol Use: No  . Drug Use: No  . Sexual Activity: Not on file   Other Topics Concern  . Not on file   Social History Narrative   HH of 3  Daughter 66 and self.  hh of 2    No pets.   No ets.   No etoh.    Working for [employer] and [employer] .  45- 50 hours per week.                 Past Surgical History  Procedure Laterality Date  . Tubal ligation    . Myomectomy    . Vena caval filter      Family History  Problem Relation Age of Onset  . Lung cancer Father     april 12   . Cancer - Other      pancreatic  in 35   . Hypertension Mother   . Rheum arthritis Mother     Allergies  Allergen Reactions  . Clindamycin/Lincomycin     "unknown"    Current Outpatient Prescriptions on File Prior to Visit  Medication Sig Dispense Refill  . [facility] STOOL SOFTENER 100 MG capsule TAKE 1 CAPSULE BY MOUTH AT BEDTIME AS NEEDED (Patient not taking: Reported on 11/16/2014) 30 capsule 0   No current facility-administered medications on file prior to visit.    BP 108/70 mmHg  Pulse 60  Temp(Src) 97.6 F (36.4 C) (Oral)  Ht 5\' 5"  (1.651 m)  Wt 213 lb 6.4 oz (96.798 kg)  BMI 35.51 kg/m2  SpO2 98%    Objective:   Physical Exam  Constitutional: She is oriented to person, place, and time. She appears well-nourished.  HENT:  Head: Normocephalic.  Cardiovascular: Normal rate and regular rhythm.   Pulmonary/Chest: Effort normal and breath sounds normal.  Musculoskeletal:        Right shoulder: She exhibits decreased range of motion and pain. She exhibits no bony tenderness, no swelling, no crepitus, no deformity and normal strength.       Left shoulder: Normal.  Neurological: She is alert and oriented to person, place, and time.  Skin: Skin is warm and dry.  Psychiatric: She has a normal mood and affect.          Assessment & Plan:  Shoulder pain:  Present to right shoulder since rotating her shoulder in an akward position one month ago. Decreased ROM and pain when arm moving backwards. Overall improved with rest and ibuprofen. Will consider PT if no resolve or pain worsens.

## 2014-11-16 NOTE — Assessment & Plan Note (Signed)
Present for several months. Meats are worst. Recently choked on ham biscuit and had to have Heimlich.  Urgent referral made to GI for endoscopy/further eval. Encouraged her to maintain a soft diet and chew foods thoroughly.

## 2014-11-16 NOTE — Patient Instructions (Addendum)
Stop by the front desk and speak with Rosaria Ferries regarding your referral to GI.  Continue taking ibuprofen for pain and resting your shoulder. Keep me updated regarding your pain and progression.  Please schedule a physical with me in the next 2 months. You will also schedule a lab only appointment one week prior. We will discuss your lab results during your physical.  It was a pleasure to meet you today! Please don't hesitate to call me with any questions. Welcome to [facility]!

## 2014-11-16 NOTE — Assessment & Plan Note (Signed)
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 Managed by our clinic. Last INR 2.2

## 2014-11-16 NOTE — Progress Notes (Signed)
Pre visit review using our clinic review tool, if applicable. No additional management support is needed unless otherwise documented below in the visit note. 

## 2014-11-17 ENCOUNTER — Ambulatory Visit (INDEPENDENT_AMBULATORY_CARE_PROVIDER_SITE_OTHER): Payer: BC Managed Care – PPO | Admitting: Internal Medicine

## 2014-11-17 ENCOUNTER — Encounter: Payer: Self-pay | Admitting: Internal Medicine

## 2014-11-17 VITALS — BP 110/66 | HR 60 | Ht 63.75 in | Wt 214.2 lb

## 2014-11-17 DIAGNOSIS — K8020 Calculus of gallbladder without cholecystitis without obstruction: Secondary | ICD-10-CM | POA: Diagnosis not present

## 2014-11-17 DIAGNOSIS — R161 Splenomegaly, not elsewhere classified: Secondary | ICD-10-CM

## 2014-11-17 DIAGNOSIS — Z7901 Long term (current) use of anticoagulants: Secondary | ICD-10-CM

## 2014-11-17 DIAGNOSIS — R1310 Dysphagia, unspecified: Secondary | ICD-10-CM

## 2014-11-17 MED ORDER — OMEPRAZOLE 20 MG PO CPDR: 20.0000 mg | DELAYED_RELEASE_CAPSULE | Freq: Every day | ORAL | Status: DC

## 2014-11-17 NOTE — Progress Notes (Signed)
Theresa Lewis Jun 23, 1960 [id number]  Note: This dictation was prepared with Dragon digital system. Any transcriptional errors that result from this procedure are unintentional.   History of Present Illness: This is a 54 year old African-American female who developed solid food dysphagia as well as some dysphagia to liquids about 2 months ago.Marland Kitchen Her symptoms have been present for about 2 months. She had an episode of food impaction about a week ago while eating ham biscuit and was able to bring it back up with Hemlich maneuver. She has a history of gastroesophageal reflux but has never taken any medications. Since  the episode last week she has been extremely careful in swallowing solid food. She experiences heartburn at night and  occasional water brash. She has been on Coumadin for recurrent DVT. She also has an IVC filter.  Coumadin was discontinued in the past  on several separate occasions such as  for dental procedures as well as for placement of IVC filter. She was told that she will stay on Coumadin permanently.. We have done a colonoscopy in December 2010. She had multiple rectal polyps which were all hyperplastic. CT scan of the abdomen in 2005 showed questionable gallstones as well as 13 cm spleen suggesting splenomegaly   Past Medical History  Diagnosis Date  . Deep venous thrombosis     2006    3 x clotting   . Pulmonary embolism   . PE (pulmonary embolism) 07/09/2010  . HEMORRHOIDS, WITH BLEEDING 12/26/2008    Qualifier: Diagnosis of  By: Regis Bill MD, [place] VAGINAL BLEEDING 04/19/2007    Qualifier: Diagnosis of  By: Hulan Saas, CMA (AAMA), Quita Skye     Past Surgical History  Procedure Laterality Date  . Tubal ligation    . Myomectomy    . Vena caval filter      Allergies  Allergen Reactions  . Clindamycin/Lincomycin     "unknown"  . Tape Other (See Comments)    When pt has a band-aid on, the impression of band-aid stays on for a month    Family history and  social history have been reviewed.  Review of Systems:   The remainder of the 10 point ROS is negative except as outlined in the H&P  Physical Exam: General Appearance Well developed, in no distress, mildly overweight Eyes  Non icteric  HEENT  Non traumatic, normocephalic  Mouth No lesion, tongue papillated, no cheilosis Neck Supple without adenopathy, thyroid not enlarged, no carotid bruits, no JVD Lungs Clear to auscultation bilaterally COR Normal S1, normal S2, regular rhythm, no murmur, quiet precordium Abdomen soft nontender with normoactive bowel sounds. Liver edge at costal margin. Spleen not palpable Rectal not done Extremities  No pedal edema Skin No lesions Neurological Alert and oriented x 3 Psychological Normal mood and affect  Assessment and Plan:   54 year old African-American female with the recent onset of dysphagia which is predominantly to solids but also to some liquids. She is currently on Coumadin for recurrent DVT. We will proceed with barium esophagram first to rule out esophageal dysmotility. We will also ask Dr. Ainsley Spinner permission to hold Coumadin for 5 days in case she needs upper endoscopy/dilation.. We will start Prilosec 20 mg daily for suspected reflux esophagitis. If the barium esophagram shows esophageal stricture we will proceed with the endoscopy and dilatation  Questionable history of gallstones and splenomegaly. Last imaging in 2005. We will proceed with upper abdominal ultrasound    Delfin Edis 11/17/2014

## 2014-11-17 NOTE — Patient Instructions (Signed)
You have been scheduled for an abdominal ultrasound at [facility] (1st floor of hospital) on 11/28/14 at 10:00 am. Please arrive 15 minutes prior to your appointment for registration. Make certain not to have anything to eat or drink 6 hours prior to your appointment. Should you need to reschedule your appointment, please contact radiology at [phone]. This test typically takes about 30 minutes to perform.  You have been scheduled for a Barium Esophogram at [facility] (1st floor of the hospital) on 11/28/14  at 10:30 am. Please arrive 15 minutes prior to your appointment for registration. Make certain not to have anything to eat or drink 6 hours prior to your test. If you need to reschedule for any reason, please contact radiology at [phone] to do so. __________________________________________________________________ A barium swallow is an examination that concentrates on views of the esophagus. This tends to be a double contrast exam (barium and two liquids which, when combined, create a gas to distend the wall of the oesophagus) or single contrast (non-ionic iodine based). The study is usually tailored to your symptoms so a good history is essential. Attention is paid during the study to the form, structure and configuration of the esophagus, looking for functional disorders (such as aspiration, dysphagia, achalasia, motility and reflux) EXAMINATION You may be asked to change into a gown, depending on the type of swallow being performed. A radiologist and radiographer will perform the procedure. The radiologist will advise you of the type of contrast selected for your procedure and direct you during the exam. You will be asked to stand, sit or lie in several different positions and to hold a small amount of fluid in your mouth before being asked to swallow while the imaging is performed .In some instances you may be asked to swallow barium coated marshmallows to assess the  motility of a solid food bolus. The exam can be recorded as a digital or video fluoroscopy procedure. POST PROCEDURE It will take 1-2 days for the barium to pass through your system. To facilitate this, it is important, unless otherwise directed, to increase your fluids for the next 24-48hrs and to resume your normal diet.  This test typically takes about 30 minutes to perform. __________________________________________________________________________________   We have sent medications to your pharmacy for pick.  You will be contaced by our office prior to your procedure for directions on holding your Coumadin/Warfarin.  If you do not hear from our office 1 week prior to your scheduled procedure, please call [phone] to discuss.

## 2014-11-28 ENCOUNTER — Ambulatory Visit (HOSPITAL_COMMUNITY)
Admission: RE | Admit: 2014-11-28 | Discharge: 2014-11-28 | Disposition: A | Discharge status: Home / Self Care | Payer: BC Managed Care – PPO | Source: Ambulatory Visit | Attending: Internal Medicine | Admitting: Internal Medicine

## 2014-11-28 DIAGNOSIS — K8020 Calculus of gallbladder without cholecystitis without obstruction: Secondary | ICD-10-CM

## 2014-11-28 DIAGNOSIS — R1310 Dysphagia, unspecified: Secondary | ICD-10-CM | POA: Insufficient documentation

## 2014-11-28 DIAGNOSIS — K219 Gastro-esophageal reflux disease without esophagitis: Secondary | ICD-10-CM | POA: Insufficient documentation

## 2014-11-28 DIAGNOSIS — R161 Splenomegaly, not elsewhere classified: Secondary | ICD-10-CM | POA: Insufficient documentation

## 2014-11-28 DIAGNOSIS — K449 Diaphragmatic hernia without obstruction or gangrene: Secondary | ICD-10-CM | POA: Diagnosis not present

## 2014-12-19 ENCOUNTER — Encounter: Payer: Self-pay | Admitting: *Deleted

## 2015-01-01 ENCOUNTER — Telehealth: Payer: Self-pay

## 2015-01-01 NOTE — Telephone Encounter (Signed)
-----   Message from Barron Alvine, [place] sent at 12/27/2014 10:07 AM EDT -----   ----- Message -----    From: Barron Alvine, CMA    Sent: 12/27/2014      To: Barron Alvine, CMA    ----- Message -----    From: Barron Alvine, CMA    Sent: 12/21/2014      To: Barron Alvine, CMA    ----- Message -----    From: Barron Alvine, CMA    Sent: 12/14/2014   8:40 AM      To: Barron Alvine, CMA    ----- Message -----    From: Barron Alvine, CMA    Sent: 12/14/2014      To: Barron Alvine, CMA    ----- Message -----    From: Barron Alvine, CMA    Sent: 12/01/2014      To: Barron Alvine, CMA  Waiting for anti coag from Alma Friendly, procedure not scheduled yet.

## 2015-01-02 NOTE — Telephone Encounter (Signed)
Pt has f/u with Olevia Perches for hiatal hernia and with Dr Carlis Abbott to discuss anti coag

## 2015-01-05 ENCOUNTER — Other Ambulatory Visit: Payer: Self-pay | Admitting: Primary Care

## 2015-01-05 DIAGNOSIS — Z0000 Encounter for general adult medical examination without abnormal findings: Secondary | ICD-10-CM

## 2015-01-12 ENCOUNTER — Other Ambulatory Visit: Payer: BC Managed Care – PPO

## 2015-01-17 ENCOUNTER — Ambulatory Visit: Payer: BC Managed Care – PPO | Admitting: Internal Medicine

## 2015-01-18 ENCOUNTER — Ambulatory Visit: Payer: BC Managed Care – PPO | Admitting: Internal Medicine

## 2015-01-19 ENCOUNTER — Ambulatory Visit: Payer: BC Managed Care – PPO | Admitting: Internal Medicine

## 2015-01-19 ENCOUNTER — Encounter: Payer: BC Managed Care – PPO | Admitting: Primary Care

## 2015-01-26 ENCOUNTER — Other Ambulatory Visit (INDEPENDENT_AMBULATORY_CARE_PROVIDER_SITE_OTHER): Payer: BC Managed Care – PPO

## 2015-01-26 DIAGNOSIS — Z0000 Encounter for general adult medical examination without abnormal findings: Secondary | ICD-10-CM

## 2015-01-26 LAB — CBC
HEMATOCRIT: 38.7 % (ref 36.0–46.0)
Hemoglobin: 12.6 g/dL (ref 12.0–15.0)
MCHC: 32.6 g/dL (ref 30.0–36.0)
MCV: 79.3 fl (ref 78.0–100.0)
Platelets: 274 10*3/uL (ref 150.0–400.0)
RBC: 4.88 Mil/uL (ref 3.87–5.11)
RDW: 14.8 % (ref 11.5–15.5)
WBC: 3.7 10*3/uL — ABNORMAL LOW (ref 4.0–10.5)

## 2015-01-26 LAB — COMPREHENSIVE METABOLIC PANEL
ALK PHOS: 70 U/L (ref 39–117)
ALT: 17 U/L (ref 0–35)
AST: 17 U/L (ref 0–37)
Albumin: 3.7 g/dL (ref 3.5–5.2)
BUN: 11 mg/dL (ref 6–23)
CO2: 30 mEq/L (ref 19–32)
CREATININE: 0.87 mg/dL (ref 0.40–1.20)
Calcium: 9.3 mg/dL (ref 8.4–10.5)
Chloride: 109 mEq/L (ref 96–112)
GFR: 86.85 mL/min (ref >60.00)
Glucose, Bld: 94 mg/dL (ref 70–99)
POTASSIUM: 3.8 meq/L (ref 3.5–5.1)
SODIUM: 144 meq/L (ref 135–145)
TOTAL PROTEIN: 6.9 g/dL (ref 6.0–8.3)
Total Bilirubin: 0.5 mg/dL (ref 0.2–1.2)

## 2015-01-26 LAB — LIPID PANEL
CHOL/HDL RATIO: 4
Cholesterol: 169 mg/dL (ref 0–200)
HDL: 43.9 mg/dL (ref >39.00)
LDL Cholesterol: 104 mg/dL — ABNORMAL HIGH (ref 0–99)
NONHDL: 125.56
Triglycerides: 106 mg/dL (ref 0.0–149.0)
VLDL: 21.2 mg/dL (ref 0.0–40.0)

## 2015-01-26 LAB — HEMOGLOBIN A1C: HEMOGLOBIN A1C: 5.8 % (ref 4.6–6.5)

## 2015-01-26 LAB — TSH: TSH: 1.57 u[IU]/mL (ref 0.35–4.50)

## 2015-01-31 ENCOUNTER — Encounter: Payer: Self-pay | Admitting: *Deleted

## 2015-01-31 ENCOUNTER — Encounter: Payer: Self-pay | Admitting: Primary Care

## 2015-01-31 ENCOUNTER — Other Ambulatory Visit: Payer: Self-pay

## 2015-01-31 DIAGNOSIS — IMO0002 Reserved for concepts with insufficient information to code with codable children: Secondary | ICD-10-CM

## 2015-01-31 MED ORDER — WARFARIN SODIUM 5 MG PO TABS: ORAL_TABLET | ORAL | Status: DC

## 2015-01-31 NOTE — Telephone Encounter (Signed)
Medication sent electronically 

## 2015-01-31 NOTE — Telephone Encounter (Signed)
Pt left v/m requesting 30 day refill of warfarin 5 mg; pt presently taking warfarin 5 mg, two pills daily and 2 1/2 pills on Sun. Quantity of # 40 is not sufficient per pt. Pt request cb. Last INR check 11/03/14.Please advise.

## 2015-02-01 ENCOUNTER — Encounter: Payer: BC Managed Care – PPO | Admitting: Primary Care

## 2015-02-15 ENCOUNTER — Encounter: Payer: Self-pay | Admitting: Primary Care

## 2015-02-15 ENCOUNTER — Telehealth: Payer: Self-pay | Admitting: Primary Care

## 2015-02-15 ENCOUNTER — Ambulatory Visit (INDEPENDENT_AMBULATORY_CARE_PROVIDER_SITE_OTHER): Payer: BC Managed Care – PPO | Admitting: Primary Care

## 2015-02-15 ENCOUNTER — Other Ambulatory Visit (HOSPITAL_COMMUNITY)
Admission: RE | Admit: 2015-02-15 | Discharge: 2015-02-15 | Disposition: A | Discharge status: Home / Self Care | Payer: BC Managed Care – PPO | Source: Ambulatory Visit | Attending: Family Medicine | Admitting: Family Medicine

## 2015-02-15 VITALS — BP 110/70 | HR 63 | Temp 98.1°F | Ht 64.0 in | Wt 211.4 lb

## 2015-02-15 DIAGNOSIS — R7303 Prediabetes: Secondary | ICD-10-CM | POA: Insufficient documentation

## 2015-02-15 DIAGNOSIS — Z01419 Encounter for gynecological examination (general) (routine) without abnormal findings: Secondary | ICD-10-CM | POA: Diagnosis present

## 2015-02-15 DIAGNOSIS — Z6836 Body mass index (BMI) 36.0-36.9, adult: Secondary | ICD-10-CM

## 2015-02-15 DIAGNOSIS — Z124 Encounter for screening for malignant neoplasm of cervix: Secondary | ICD-10-CM

## 2015-02-15 DIAGNOSIS — M25449 Effusion, unspecified hand: Secondary | ICD-10-CM

## 2015-02-15 DIAGNOSIS — R1310 Dysphagia, unspecified: Secondary | ICD-10-CM

## 2015-02-15 DIAGNOSIS — Z0000 Encounter for general adult medical examination without abnormal findings: Secondary | ICD-10-CM

## 2015-02-15 DIAGNOSIS — R7309 Other abnormal glucose: Secondary | ICD-10-CM

## 2015-02-15 DIAGNOSIS — Z23 Encounter for immunization: Secondary | ICD-10-CM

## 2015-02-15 NOTE — Progress Notes (Signed)
Subjective:    Patient ID: Theresa Lewis, female    DOB: 11/19/1960, 54 y.o.   MRN: [id number]  HPI  Theresa Lewis is a 54 year old female who presents today for complete physical.  Immunizations: -Tetanus: Completed in 2014 -Influenza: Has not completed this season.    Diet: Endorses a fair diet, but believes she can improve. Breakfast: Fast food, biscuit and grits, hot cakes. Cereal with milk. Lunch: Left overs from dinner. Salad, or meals from work (meat, veggie, starch). Dinner: [employer], sometimes cooks (meat, veggies, starch). Desserts: Small treat after every meal at night. Snacks: Crackers, chips Beverages: Water, pepsi. Exercise: She is walking around campus for 10 minutes to 30 minutes 5 days weekly.  Wt Readings from Last 3 Encounters:  02/15/15 211 lb 6.4 oz (95.89 kg)  11/17/14 214 lb 4 oz (97.183 kg)  11/16/14 213 lb 6.4 oz (96.798 kg)     Eye exam: Completed in January 2016, slight change in prescription. Dental exam: Completed 2 years ago. Colonoscopy: Completed 5 years ago, clean, return in 10 years (5 additional years) Pap Smear: Has not completed in over 3 years. Mammogram: Completed September 2016.    Review of Systems  Constitutional: Negative for unexpected weight change.  HENT: Negative for rhinorrhea.   Respiratory: Negative for shortness of breath.   Cardiovascular: Negative for chest pain.  Gastrointestinal: Negative for diarrhea and constipation.  Genitourinary: Negative for difficulty urinating.       Postmenopausal  Musculoskeletal: Negative for myalgias.       Left 2nd and 4th digit inflammation to DIP joints.  Skin: Negative for rash.  Neurological: Negative for dizziness, numbness and headaches.  Psychiatric/Behavioral:       Denies concerns for anxiety or depression       Past Medical History  Diagnosis Date  . Deep venous thrombosis     2006    3 x clotting   . Pulmonary embolism   . PE (pulmonary embolism) 07/09/2010  .  HEMORRHOIDS, WITH BLEEDING 12/26/2008    Qualifier: Diagnosis of  By: Regis Bill MD, [place] VAGINAL BLEEDING 04/19/2007    Qualifier: Diagnosis of  By: Hulan Saas, CMA (AAMA), Quita Skye     Social History   Social History  . Marital Status: Married    Spouse Name: N/A  . Number of Children: 1  . Years of Education: N/A   Occupational History  . Scientist, water quality    Social History Main Topics  . Smoking status: Never Smoker   . Smokeless tobacco: Never Used  . Alcohol Use: 0.0 oz/week    0 Standard drinks or equivalent per week     Comment: rarely  . Drug Use: No  . Sexual Activity: Not on file   Other Topics Concern  . Not on file   Social History Narrative   HH of 3  Daughter 59 and self.  hh of 2    No pets.   No ets.   No etoh.    Working for [employer] and [employer] .  45- 50 hours per week.                 Past Surgical History  Procedure Laterality Date  . Tubal ligation    . Myomectomy    . Vena caval filter      Family History  Problem Relation Age of Onset  . Lung cancer Father 73    april 12   .  Pancreatic cancer Father 13  . Hypertension Mother   . Rheum arthritis Mother   . Colon cancer Neg Hx   . Colon polyps Neg Hx   . Kidney disease Neg Hx   . Diabetes Paternal Aunt     x2  . Esophageal cancer Neg Hx   . Gallbladder disease Neg Hx     Allergies  Allergen Reactions  . Clindamycin/Lincomycin     "unknown"  . Tape Other (See Comments)    When pt has a band-aid on, the impression of band-aid stays on for a month    Current Outpatient Prescriptions on File Prior to Visit  Medication Sig Dispense Refill  . [facility] STOOL SOFTENER 100 MG capsule TAKE 1 CAPSULE BY MOUTH AT BEDTIME AS NEEDED 30 capsule 0  . omeprazole (PRILOSEC) 20 MG capsule Take 1 capsule (20 mg total) by mouth daily. 30 capsule 3  . warfarin (COUMADIN) 5 MG tablet Take as directed by anti-coagulation clinic. 60 tablet 0   No current facility-administered medications  on file prior to visit.    BP 110/70 mmHg  Pulse 63  Temp(Src) 98.1 F (36.7 C) (Oral)  Ht 5\' 4"  (1.626 m)  Wt 211 lb 6.4 oz (95.89 kg)  BMI 36.27 kg/m2  SpO2 98%    Objective:   Physical Exam  Constitutional: She is oriented to person, place, and time. She appears well-nourished.  HENT:  Right Ear: Tympanic membrane and ear canal normal.  Left Ear: Tympanic membrane and ear canal normal.  Nose: Nose normal.  Mouth/Throat: Oropharynx is clear and moist.  Eyes: Conjunctivae and EOM are normal. Pupils are equal, round, and reactive to light.  Neck: Neck supple. No thyromegaly present.  Cardiovascular: Normal rate and regular rhythm.   Pulmonary/Chest: Effort normal and breath sounds normal.  Abdominal: Soft. Bowel sounds are normal. There is no tenderness.  Genitourinary: Vagina normal. Cervix exhibits no motion tenderness. No vaginal discharge found.  Trace whitish discharge from cervical os. She is asymptomatic.   Musculoskeletal: Normal range of motion.  Lymphadenopathy:    She has no cervical adenopathy.  Neurological: She is alert and oriented to person, place, and time. She has normal reflexes. No cranial nerve deficit.  Skin: Skin is warm and dry.  Psychiatric: She has a normal mood and affect.          Assessment & Plan:

## 2015-02-15 NOTE — Progress Notes (Signed)
Pre visit review using our clinic review tool, if applicable. No additional management support is needed unless otherwise documented below in the visit note. 

## 2015-02-15 NOTE — Telephone Encounter (Signed)
Will you please call Ms. Molinari and have her schedule a lab only appointment for the labs we forgot to do today? I would like to check for rheumatoid arthritis. Please apologize for me! Thanks.

## 2015-02-15 NOTE — Telephone Encounter (Signed)
Called and notified patient of Kate's comments. Patient verbalized understanding. Patient will come back on 02/16/15 for labs.

## 2015-02-15 NOTE — Assessment & Plan Note (Signed)
Followed with GI, underwent endoscopy with slightly decreased ability for esophagus to propel food downward. She is cutting her food into smaller pieces and chewing longer. Thus far no difficulty with swallowing.

## 2015-02-15 NOTE — Patient Instructions (Addendum)
It is important that you improve your diet. Please limit carbohydrates in the form of white bread, rice, pasta, cakes, cookies, sugary drinks, etc. Increase your consumption of fresh fruits and vegetables. Be sure to drink plenty of water daily.  You should be getting 1 hour of moderate intensity exercise 5 days a week.  Follow up in 3 months (after December 22nd) for re-evaluation of borderline diabetes.   It was a pleasure to see you today!

## 2015-02-15 NOTE — Assessment & Plan Note (Signed)
A1C of 5.8 on labs today. Discussed to cut back on pepsi, fast food, pastas, and sweets. She is to start exercising for 1 hour 5 days weekly. Will recheck A1C in 3 months.

## 2015-02-15 NOTE — Assessment & Plan Note (Signed)
Weight loss of 3 pounds since last visit. Discussed to decrease sugars and fast food. She is to start exercising 1 hour, 5 days weekly. Will continue to monitor.

## 2015-02-15 NOTE — Assessment & Plan Note (Signed)
Tetanus, colonoscopy, and mammogram UTD. Pap and influenza provided today. Labs mostly unremarkable except for borderline diabetes. Exam unremarkable. Discussed the importance of improving her diet and exercising. Follow up in 1 year for repeat physical.

## 2015-02-16 ENCOUNTER — Other Ambulatory Visit (INDEPENDENT_AMBULATORY_CARE_PROVIDER_SITE_OTHER): Payer: BC Managed Care – PPO

## 2015-02-16 DIAGNOSIS — M25449 Effusion, unspecified hand: Secondary | ICD-10-CM

## 2015-02-16 LAB — RHEUMATOID FACTOR

## 2015-02-16 LAB — C-REACTIVE PROTEIN: CRP: 1.1 mg/dL (ref 0.5–20.0)

## 2015-02-17 LAB — ANA: Anti Nuclear Antibody(ANA): NEGATIVE

## 2015-02-20 ENCOUNTER — Encounter: Payer: Self-pay | Admitting: *Deleted

## 2015-02-20 LAB — CYTOLOGY - PAP

## 2015-03-02 ENCOUNTER — Other Ambulatory Visit: Payer: Self-pay | Admitting: Internal Medicine

## 2015-03-02 NOTE — Telephone Encounter (Signed)
Last INR 11/03/14.  Patient needs recheck prior to additional refills.  Left message for patient to return call.

## 2015-03-11 ENCOUNTER — Other Ambulatory Visit: Payer: Self-pay | Admitting: Internal Medicine

## 2015-03-12 NOTE — Telephone Encounter (Signed)
Last INR 11/03/14. Patient needs recheck prior to additional refills. Left message for patient to return call.

## 2015-03-12 NOTE — Telephone Encounter (Signed)
Patient returned call: advised of need for INR check.  Patient worked in tomorrow morning at [employer].  Will refill medication at that time.

## 2015-03-13 ENCOUNTER — Ambulatory Visit: Payer: BC Managed Care – PPO

## 2015-03-15 ENCOUNTER — Ambulatory Visit (INDEPENDENT_AMBULATORY_CARE_PROVIDER_SITE_OTHER): Payer: BC Managed Care – PPO | Admitting: *Deleted

## 2015-03-15 DIAGNOSIS — Z5181 Encounter for therapeutic drug level monitoring: Secondary | ICD-10-CM

## 2015-03-15 DIAGNOSIS — D6869 Other thrombophilia: Secondary | ICD-10-CM

## 2015-03-15 DIAGNOSIS — Z86718 Personal history of other venous thrombosis and embolism: Secondary | ICD-10-CM

## 2015-03-15 LAB — POCT INR: INR: 1.7

## 2015-03-16 NOTE — Progress Notes (Signed)
Pre visit review using our clinic review tool, if applicable. No additional management support is needed unless otherwise documented below in the visit note. 

## 2015-03-29 ENCOUNTER — Ambulatory Visit (INDEPENDENT_AMBULATORY_CARE_PROVIDER_SITE_OTHER): Payer: BC Managed Care – PPO | Admitting: *Deleted

## 2015-03-29 ENCOUNTER — Telehealth: Payer: Self-pay | Admitting: Radiology

## 2015-03-29 DIAGNOSIS — Z5181 Encounter for therapeutic drug level monitoring: Secondary | ICD-10-CM

## 2015-03-29 DIAGNOSIS — D6869 Other thrombophilia: Secondary | ICD-10-CM | POA: Diagnosis not present

## 2015-03-29 LAB — POCT INR: INR: 6.4

## 2015-03-29 LAB — PROTIME-INR
INR: 6.5 ratio — AB (ref 0.8–1.0)
Prothrombin Time: 68.3 s (ref 9.6–13.1)

## 2015-03-29 NOTE — Progress Notes (Signed)
Pre visit review using our clinic review tool, if applicable. No additional management support is needed unless otherwise documented below in the visit note. 

## 2015-03-29 NOTE — Telephone Encounter (Signed)
See anticoagulation encounter

## 2015-03-29 NOTE — Telephone Encounter (Signed)
[facility] called critical INR, 6.5. Results given to Alma Friendly, NP

## 2015-03-29 NOTE — Telephone Encounter (Signed)
Noted and discussed with Barnett Applebaum, RN.

## 2015-04-03 ENCOUNTER — Ambulatory Visit (INDEPENDENT_AMBULATORY_CARE_PROVIDER_SITE_OTHER): Payer: BC Managed Care – PPO | Admitting: *Deleted

## 2015-04-03 DIAGNOSIS — D6869 Other thrombophilia: Secondary | ICD-10-CM

## 2015-04-03 DIAGNOSIS — Z86718 Personal history of other venous thrombosis and embolism: Secondary | ICD-10-CM | POA: Diagnosis not present

## 2015-04-03 DIAGNOSIS — Z5181 Encounter for therapeutic drug level monitoring: Secondary | ICD-10-CM

## 2015-04-03 LAB — POCT INR: INR: 1.3

## 2015-04-03 NOTE — Progress Notes (Signed)
Pre visit review using our clinic review tool, if applicable. No additional management support is needed unless otherwise documented below in the visit note. 

## 2015-04-12 ENCOUNTER — Other Ambulatory Visit: Payer: Self-pay | Admitting: Primary Care

## 2015-04-12 NOTE — Telephone Encounter (Signed)
Ok to refill? Last INR was 04/03/2015. Next 04/26/2015.

## 2015-04-20 ENCOUNTER — Other Ambulatory Visit: Payer: Self-pay | Admitting: Primary Care

## 2015-04-23 NOTE — Telephone Encounter (Signed)
Electronically refill request for   warfarin (COUMADIN) 5 MG tablet   TAKE AS DIRECTED BY ANTI-COAGULATION CLINIC.  Dispense: 200 tablet        Last prescribed on 03/15/2015. Last seen on 02/15/2015. Last INR on 04/13/15. Next INR on 04/26/2015.

## 2015-04-24 NOTE — Telephone Encounter (Signed)
Refill not due, last filled #200 on 03/15/15.

## 2015-04-26 ENCOUNTER — Ambulatory Visit: Payer: BC Managed Care – PPO

## 2015-05-03 ENCOUNTER — Ambulatory Visit (INDEPENDENT_AMBULATORY_CARE_PROVIDER_SITE_OTHER): Payer: BC Managed Care – PPO | Admitting: *Deleted

## 2015-05-03 DIAGNOSIS — D6869 Other thrombophilia: Secondary | ICD-10-CM | POA: Diagnosis not present

## 2015-05-03 DIAGNOSIS — Z5181 Encounter for therapeutic drug level monitoring: Secondary | ICD-10-CM | POA: Diagnosis not present

## 2015-05-03 DIAGNOSIS — Z86718 Personal history of other venous thrombosis and embolism: Secondary | ICD-10-CM | POA: Diagnosis not present

## 2015-05-03 LAB — POCT INR: INR: 3.5

## 2015-05-03 NOTE — Progress Notes (Signed)
Pre visit review using our clinic review tool, if applicable. No additional management support is needed unless otherwise documented below in the visit note. 

## 2015-05-29 ENCOUNTER — Other Ambulatory Visit: Payer: Self-pay | Admitting: *Deleted

## 2015-05-29 MED ORDER — OMEPRAZOLE 20 MG PO CPDR: 20.0000 mg | DELAYED_RELEASE_CAPSULE | Freq: Every day | ORAL | Status: DC

## 2015-06-05 ENCOUNTER — Ambulatory Visit: Payer: BC Managed Care – PPO

## 2015-06-07 ENCOUNTER — Ambulatory Visit: Payer: BC Managed Care – PPO

## 2015-06-07 ENCOUNTER — Ambulatory Visit (INDEPENDENT_AMBULATORY_CARE_PROVIDER_SITE_OTHER): Payer: BC Managed Care – PPO | Admitting: *Deleted

## 2015-06-07 DIAGNOSIS — Z5181 Encounter for therapeutic drug level monitoring: Secondary | ICD-10-CM | POA: Diagnosis not present

## 2015-06-07 DIAGNOSIS — D6869 Other thrombophilia: Secondary | ICD-10-CM | POA: Diagnosis not present

## 2015-06-07 DIAGNOSIS — Z86718 Personal history of other venous thrombosis and embolism: Secondary | ICD-10-CM

## 2015-06-07 LAB — POCT INR: INR: 2.2

## 2015-06-07 NOTE — Progress Notes (Signed)
Pre visit review using our clinic review tool, if applicable. No additional management support is needed unless otherwise documented below in the visit note. 

## 2015-06-09 ENCOUNTER — Other Ambulatory Visit: Payer: Self-pay | Admitting: Primary Care

## 2015-06-11 NOTE — Telephone Encounter (Signed)
Electronically refill request for   warfarin (COUMADIN) 5 MG tablet   TAKE AS DIRECTED BY ANTI-COAGULATION CLINIC.  Dispense: 200 tablet   Refills: 0     Last prescribed on 03/15/2015. Last seen coumadin on 06/07/2015. Next 07/02/2015.

## 2015-07-02 ENCOUNTER — Ambulatory Visit (INDEPENDENT_AMBULATORY_CARE_PROVIDER_SITE_OTHER): Payer: BC Managed Care – PPO | Admitting: *Deleted

## 2015-07-02 DIAGNOSIS — Z86718 Personal history of other venous thrombosis and embolism: Secondary | ICD-10-CM | POA: Diagnosis not present

## 2015-07-02 DIAGNOSIS — D6869 Other thrombophilia: Secondary | ICD-10-CM | POA: Diagnosis not present

## 2015-07-02 DIAGNOSIS — Z5181 Encounter for therapeutic drug level monitoring: Secondary | ICD-10-CM

## 2015-07-02 LAB — POCT INR: INR: 2.8

## 2015-07-02 NOTE — Progress Notes (Signed)
Pre visit review using our clinic review tool, if applicable. No additional management support is needed unless otherwise documented below in the visit note. 

## 2015-07-02 NOTE — Progress Notes (Signed)
INR is therapeutic.  Patient will continue current dose, no other changes.

## 2015-07-26 ENCOUNTER — Ambulatory Visit (INDEPENDENT_AMBULATORY_CARE_PROVIDER_SITE_OTHER): Payer: BC Managed Care – PPO | Admitting: *Deleted

## 2015-07-26 DIAGNOSIS — Z86718 Personal history of other venous thrombosis and embolism: Secondary | ICD-10-CM

## 2015-07-26 DIAGNOSIS — Z5181 Encounter for therapeutic drug level monitoring: Secondary | ICD-10-CM

## 2015-07-26 DIAGNOSIS — D6869 Other thrombophilia: Secondary | ICD-10-CM | POA: Diagnosis not present

## 2015-07-26 LAB — POCT INR: INR: 3.5

## 2015-07-26 NOTE — Progress Notes (Signed)
Pre visit review using our clinic review tool, if applicable. No additional management support is needed unless otherwise documented below in the visit note. 

## 2015-07-30 ENCOUNTER — Ambulatory Visit: Payer: BC Managed Care – PPO

## 2015-09-05 ENCOUNTER — Telehealth: Payer: Self-pay

## 2015-09-05 NOTE — Telephone Encounter (Signed)
Pt request name of med for acid reflux; pt has refills but has lost bottle and does not know name of med; pt advised omeprazole 20 mg. Pt voiced understanding.

## 2015-09-06 ENCOUNTER — Ambulatory Visit (INDEPENDENT_AMBULATORY_CARE_PROVIDER_SITE_OTHER): Payer: BC Managed Care – PPO | Admitting: *Deleted

## 2015-09-06 DIAGNOSIS — Z5181 Encounter for therapeutic drug level monitoring: Secondary | ICD-10-CM | POA: Diagnosis not present

## 2015-09-06 DIAGNOSIS — D6869 Other thrombophilia: Secondary | ICD-10-CM

## 2015-09-06 DIAGNOSIS — Z86718 Personal history of other venous thrombosis and embolism: Secondary | ICD-10-CM | POA: Diagnosis not present

## 2015-09-06 LAB — POCT INR: INR: 4.8

## 2015-09-06 NOTE — Progress Notes (Signed)
Pre visit review using our clinic review tool, if applicable. No additional management support is needed unless otherwise documented below in the visit note. 

## 2015-09-06 NOTE — Progress Notes (Signed)
INR is sub therapeutic today, and elevated at last check although within range.  Patient denies any extra doses, new medications, or diet changes.  Will hold today and decrease dose to 2 tablets daily.  Patient encouraged to eat a good serving of greens for the next 2-3 days.  Will recheck in 4 weeks and make additional adjustments at that time if needed.

## 2015-09-07 ENCOUNTER — Other Ambulatory Visit: Payer: Self-pay | Admitting: Primary Care

## 2015-09-18 ENCOUNTER — Telehealth: Payer: Self-pay | Admitting: Primary Care

## 2015-09-18 NOTE — Telephone Encounter (Signed)
Pt has appt with Allie Bossier NP on 09/19/15 at 9:30.

## 2015-09-18 NOTE — Telephone Encounter (Signed)
Noted and agree. Will evaluate on 04/26

## 2015-09-18 NOTE — Telephone Encounter (Signed)
Patient Name: Theresa Lewis  DOB: July 24, 1960    Initial Comment Caller states having pain in jaw that radiates to temple. goes away w/ibuprofen but on coumadin; tylenol does not help; anything else can take?    Nurse Assessment  Nurse: Mallie Mussel, RN, Alveta Heimlich Date/Time Eilene Ghazi Time): 09/18/2015 1:25:16 PM  Confirm and document reason for call. If symptomatic, describe symptoms. You must click the next button to save text entered. ---Caller states having pain in jaw that radiates to temple. goes away w/ibuprofen but on coumadin; tylenol does not help; anything else can take? She rates her current pain as 9.5 on 0-10 scale. She went ahead and took IBU again due to the pain. She denies MI and Angina. She was seen and they thought it might be the Trigeminal nerve pain. She denies injury.  Has the patient traveled out of the country within the last 30 days? ---No  Does the patient have any new or worsening symptoms? ---Yes  Will a triage be completed? ---Yes  Related visit to physician within the last 2 weeks? ---No  Does the PT have any chronic conditions? (i.e. diabetes, asthma, etc.) ---Yes  List chronic conditions. ---DVT  Is this a behavioral health or substance abuse call? ---No     Guidelines    Guideline Title Affirmed Question Affirmed Notes  Face Pain Face pain present > 24 hours    Final Disposition User   See Physician within 24 Hours Mallie Mussel, RN, Alveta Heimlich    Comments    IBU helps with the pain, but its waking her up now at night. This is new.  Appointment was scheduled for 09/19/15 at 9:30am with Alma Friendly NP.   Referrals  REFERRED TO PCP OFFICE   Disagree/Comply: Comply

## 2015-09-19 ENCOUNTER — Encounter: Payer: Self-pay | Admitting: Primary Care

## 2015-09-19 ENCOUNTER — Ambulatory Visit (INDEPENDENT_AMBULATORY_CARE_PROVIDER_SITE_OTHER): Payer: BC Managed Care – PPO | Admitting: Primary Care

## 2015-09-19 VITALS — BP 120/80 | HR 50 | Temp 98.0°F | Ht 65.0 in | Wt 218.1 lb

## 2015-09-19 DIAGNOSIS — G500 Trigeminal neuralgia: Secondary | ICD-10-CM | POA: Insufficient documentation

## 2015-09-19 DIAGNOSIS — M792 Neuralgia and neuritis, unspecified: Secondary | ICD-10-CM | POA: Diagnosis not present

## 2015-09-19 LAB — BASIC METABOLIC PANEL
BUN: 13 mg/dL (ref 6–23)
CALCIUM: 9.5 mg/dL (ref 8.4–10.5)
CHLORIDE: 109 meq/L (ref 96–112)
CO2: 29 mEq/L (ref 19–32)
CREATININE: 0.92 mg/dL (ref 0.40–1.20)
GFR: 81.23 mL/min (ref >60.00)
Glucose, Bld: 90 mg/dL (ref 70–99)
Potassium: 4.4 mEq/L (ref 3.5–5.1)
Sodium: 143 mEq/L (ref 135–145)

## 2015-09-19 LAB — SEDIMENTATION RATE: SED RATE: 37 mm/h — AB (ref 0–22)

## 2015-09-19 MED ORDER — BACLOFEN 10 MG PO TABS: ORAL_TABLET | ORAL | Status: DC

## 2015-09-19 NOTE — Patient Instructions (Signed)
Start Baclofen tablets for nerve pain. Take 1/2 tablet by mouth three times daily for 2 weeks. You may increase the dose to 10 mg every other day if no improvement on 5 mg.  Caution as this medication may make you drowsy.  Complete lab work prior to leaving today. I will notify you of your results once received.   Please follow up in 1 month for re-evaluation or sooner if no improvement.  It was a pleasure to see you today!  Trigeminal Neuralgia Trigeminal neuralgia is a nerve disorder that causes attacks of severe facial pain. The attacks last from a few seconds to several minutes. They can happen for days, weeks, or months and then go away for months or years. Trigeminal neuralgia is also called tic douloureux. CAUSES This condition is caused by damage to a nerve in the face that is called the trigeminal nerve. An attack can be triggered by:  Talking.  Chewing.  Putting on makeup.  Washing your face.  Shaving your face.  Brushing your teeth.  Touching your face. RISK FACTORS This condition is more likely to develop in:  Women.  People who are 12 years of age or older. SYMPTOMS The main symptom of this condition is pain in the jaw, lips, eyes, nose, scalp, forehead, and face. The pain may be intense, stabbing, electric, or shock-like. DIAGNOSIS This condition is diagnosed with a physical exam. A CT scan or MRI may be done to rule out other conditions that can cause facial pain. TREATMENT This condition may be treated with:  Avoiding the things that trigger your attacks.  Pain medicine.  Surgery. This may be done in severe cases if other medical treatment does not provide relief. HOME CARE INSTRUCTIONS  Take over-the-counter and prescription medicines only as told by your health care provider.  If you wish to get pregnant, talk with your health care provider before you start trying to get pregnant.  Avoid the things that trigger your attacks. It may help to:  Chew  on the unaffected side of your mouth.  Avoid touching your face.  Avoid blasts of hot or cold air. SEEK MEDICAL CARE IF:  Your pain medicine is not helping.  You develop new, unexplained symptoms, such as:  Double vision.  Facial weakness.  Changes in hearing or balance.  You become pregnant. SEEK IMMEDIATE MEDICAL CARE IF:  Your pain is unbearable, and your pain medicine does not help.   This information is not intended to replace advice given to you by your health care provider. Make sure you discuss any questions you have with your health care provider.   Document Released: 05/09/2000 Document Revised: 01/31/2015 Document Reviewed: 09/04/2014 Elsevier Interactive Patient Education [employer].

## 2015-09-19 NOTE — Progress Notes (Signed)
Pre visit review using our clinic review tool, if applicable. No additional management support is needed unless otherwise documented below in the visit note. 

## 2015-09-19 NOTE — Assessment & Plan Note (Signed)
Located to left side of face to trigeminal nerve. Suspect trigeminal neuralgia based off HPI and symptoms. Will rule out GCA and electrolyte imbalances via labs today.  RX for Baclofen provided to use x 2 weeks as she is on coumadin which reacts with Tegretol. Drowsiness precautions provided.  Labs pending. Follow up in 1 month for re-evaluation or sooner if needed.

## 2015-09-19 NOTE — Progress Notes (Signed)
Subjective:    Patient ID: Theresa Lewis, female    DOB: [birth year], 55 y.o.   MRN: [id number]  HPI  Theresa Lewis is a 55 year old female who presents today with a chief complaint of jaw pain. She called into team health on 04/25 with reports of left sided jaw pain that radiates to her left temple with improvement after taking ibuprofen. Her pain will start to her left jaw/mandbile and will radiate upward to her left temporal region. Her pain has been present for he past 2 weeks but over the past several days the pain has become worse and is lasting longer in duration.  She has no prior diagnosis of trigeminal neuralgia. She describes her pain as sharp, shooting, achy feeling. She has noticed numbness to the left side of her cheek after her pain has improved. She's experiencing these attacks daily. She's had improvement in her pain with ibuprofen. Denies chest pain, nausea, shortness of breath, abdominal pain, headaches, fevers.    Review of Systems  Constitutional: Negative for fever.  Skin: Negative for color change.  Neurological: Positive for numbness. Negative for headaches.       Left sided sharp, shooting, facial pain.       Past Medical History  Diagnosis Date  . Deep venous thrombosis ([place])     2006    3 x clotting   . Pulmonary embolism ([place])   . PE (pulmonary embolism) 07/09/2010  . HEMORRHOIDS, WITH BLEEDING 12/26/2008    Qualifier: Diagnosis of  By: Regis Bill MD, [place] VAGINAL BLEEDING 04/19/2007    Qualifier: Diagnosis of  By: Hulan Saas, CMA (AAMA), [place] Skye      Social History   Social History  . Marital Status: Married    Spouse Name: N/A  . Number of Children: 1  . Years of Education: N/A   Occupational History  . Scientist, water quality    Social History Main Topics  . Smoking status: Never Smoker   . Smokeless tobacco: Never Used  . Alcohol Use: 0.0 oz/week    0 Standard drinks or equivalent per week     Comment: rarely  . Drug Use: No  .  Sexual Activity: Not on file   Other Topics Concern  . Not on file   Social History Narrative   HH of 3  Daughter 33 and self.  hh of 2    No pets.   No ets.   No etoh.    Working for [employer] and T .  45- 50 hours per week.                 Past Surgical History  Procedure Laterality Date  . Tubal ligation    . Myomectomy    . Vena caval filter      Family History  Problem Relation Age of Onset  . Lung cancer Father 73    april 12   . Pancreatic cancer Father 47  . Hypertension Mother   . Rheum arthritis Mother   . Colon cancer Neg Hx   . Colon polyps Neg Hx   . Kidney disease Neg Hx   . Diabetes Paternal Aunt     x2  . Esophageal cancer Neg Hx   . Gallbladder disease Neg Hx     Allergies  Allergen Reactions  . Clindamycin/Lincomycin     "unknown"  . Tape Other (See Comments)    When pt has a band-aid on, the impression  of band-aid stays on for a month    Current Outpatient Prescriptions on File Prior to Visit  Medication Sig Dispense Refill  . omeprazole (PRILOSEC) 20 MG capsule Take 1 capsule (20 mg total) by mouth daily. 30 capsule 3  . warfarin (COUMADIN) 5 MG tablet TAKE AS DIRECTED BY ANTI-COAGULATION CLINIC. 200 tablet 0  . [facility] STOOL SOFTENER 100 MG capsule TAKE 1 CAPSULE BY MOUTH AT BEDTIME AS NEEDED 30 capsule 0   No current facility-administered medications on file prior to visit.    BP 120/80 mmHg  Pulse 50  Temp(Src) 98 F (36.7 C) (Oral)  Ht 5\' 5"  (1.651 m)  Wt 218 lb 1.9 oz (98.939 kg)  BMI 36.30 kg/m2  SpO2 98%    Objective:   Physical Exam  Constitutional: She appears well-nourished.  Eyes: EOM are normal. Pupils are equal, round, and reactive to light.  Cardiovascular: Normal rate and regular rhythm.   Pulmonary/Chest: Effort normal and breath sounds normal.  Neurological: No cranial nerve deficit.  Skin: Skin is warm and dry.          Assessment & Plan:

## 2015-10-04 ENCOUNTER — Ambulatory Visit (INDEPENDENT_AMBULATORY_CARE_PROVIDER_SITE_OTHER): Payer: BC Managed Care – PPO | Admitting: *Deleted

## 2015-10-04 DIAGNOSIS — Z5181 Encounter for therapeutic drug level monitoring: Secondary | ICD-10-CM

## 2015-10-04 DIAGNOSIS — D6869 Other thrombophilia: Secondary | ICD-10-CM | POA: Diagnosis not present

## 2015-10-04 DIAGNOSIS — Z86718 Personal history of other venous thrombosis and embolism: Secondary | ICD-10-CM

## 2015-10-04 LAB — POCT INR: INR: 3.3

## 2015-10-04 NOTE — Progress Notes (Signed)
Pre visit review using our clinic review tool, if applicable. No additional management support is needed unless otherwise documented below in the visit note. 

## 2015-10-25 ENCOUNTER — Other Ambulatory Visit: Payer: Self-pay | Admitting: Primary Care

## 2015-10-25 MED ORDER — OMEPRAZOLE 20 MG PO CPDR: 20.0000 mg | DELAYED_RELEASE_CAPSULE | Freq: Every day | ORAL | Status: DC

## 2015-10-25 NOTE — Telephone Encounter (Signed)
Received faxed request to change omeprazole 20 capsule to 90 days supply. Last prescribed on 05/29/2015. Last seen on 09/19/2015.  [facility].

## 2015-11-15 ENCOUNTER — Other Ambulatory Visit (INDEPENDENT_AMBULATORY_CARE_PROVIDER_SITE_OTHER): Payer: BC Managed Care – PPO

## 2015-11-15 DIAGNOSIS — D6869 Other thrombophilia: Secondary | ICD-10-CM | POA: Diagnosis not present

## 2015-11-15 DIAGNOSIS — Z5181 Encounter for therapeutic drug level monitoring: Secondary | ICD-10-CM

## 2015-11-15 DIAGNOSIS — Z86718 Personal history of other venous thrombosis and embolism: Secondary | ICD-10-CM

## 2015-11-15 LAB — POCT INR: INR: 2.6

## 2015-12-10 ENCOUNTER — Other Ambulatory Visit: Payer: Self-pay | Admitting: Primary Care

## 2016-03-06 ENCOUNTER — Other Ambulatory Visit: Payer: Self-pay | Admitting: Primary Care

## 2016-03-06 DIAGNOSIS — M792 Neuralgia and neuritis, unspecified: Secondary | ICD-10-CM

## 2016-03-06 NOTE — Telephone Encounter (Signed)
Pt is wanting to know what her lab results are from her ra testing, and if she has not had that testing, she is requesting it.  She is having hand pain  cb number is [phone]

## 2016-03-07 ENCOUNTER — Other Ambulatory Visit: Payer: Self-pay | Admitting: Primary Care

## 2016-03-07 DIAGNOSIS — M792 Neuralgia and neuritis, unspecified: Secondary | ICD-10-CM

## 2016-03-07 NOTE — Telephone Encounter (Signed)
The baclofen was provided for a specific complaint of temporal pain months ago. If she's experiencing a different symptom (i.e. Hand pain) then she needs an office visit for further evaluation.

## 2016-03-07 NOTE — Telephone Encounter (Signed)
Meghan at front desk spoke with pt and pt was advised med still pending.

## 2016-03-07 NOTE — Telephone Encounter (Signed)
Pt notified as instructed and pt will cb for appt.

## 2016-03-07 NOTE — Telephone Encounter (Signed)
Notified patient regarding the last time we spoke and if she remembered it. Notified patient again the last lab was back on 09/19/2015. After that patient stated that she ran out of the baclofen and would like a refill then.  Ok to refill?

## 2016-03-09 ENCOUNTER — Other Ambulatory Visit: Payer: Self-pay | Admitting: Primary Care

## 2016-03-10 ENCOUNTER — Telehealth: Payer: Self-pay | Admitting: Primary Care

## 2016-03-10 NOTE — Telephone Encounter (Signed)
Ok to refill? Electronically refill request for   warfarin (COUMADIN) 5 MG tablet  Last prescribed on 12/10/2015. Last seen on 11/15/2015. No future appointment.

## 2016-03-10 NOTE — Telephone Encounter (Signed)
Received a refill request for Coumadin.  1. Is she still taking Coumadin? 2.Who is managing her INR levels? 3. Is she getting her INR levels checked, how often? 4. When was her last check?

## 2016-03-11 NOTE — Telephone Encounter (Signed)
Message left for patient to return my call.  

## 2016-03-13 NOTE — Telephone Encounter (Signed)
Message left for patient to return my call.  

## 2016-03-13 NOTE — Telephone Encounter (Signed)
Spoken and notified patient of Kate's comments. Patient verbalized understanding. 

## 2016-03-13 NOTE — Telephone Encounter (Signed)
Please notify patient that we will refill her coumadin once we've checked her INR. Does she have enough to last until then?

## 2016-03-13 NOTE — Telephone Encounter (Signed)
Spoken to patient.  1. Yes, patient is still taking Coumadin. 2. She stated that our office does but she have not have it checked since 11/15/2015 in the lab. 3 & 4. Last time, it was checked on 11/15/2015.  I have already schedule her a lab appt to check her INR on 03/18/2016.

## 2016-03-18 ENCOUNTER — Other Ambulatory Visit (INDEPENDENT_AMBULATORY_CARE_PROVIDER_SITE_OTHER): Payer: BC Managed Care – PPO

## 2016-03-18 DIAGNOSIS — Z5181 Encounter for therapeutic drug level monitoring: Secondary | ICD-10-CM

## 2016-03-18 DIAGNOSIS — D6869 Other thrombophilia: Secondary | ICD-10-CM

## 2016-03-18 DIAGNOSIS — Z86718 Personal history of other venous thrombosis and embolism: Secondary | ICD-10-CM | POA: Diagnosis not present

## 2016-03-18 LAB — POCT INR: INR: 2.4

## 2016-04-08 ENCOUNTER — Ambulatory Visit: Payer: BC Managed Care – PPO | Admitting: Primary Care

## 2016-04-24 ENCOUNTER — Other Ambulatory Visit: Payer: BC Managed Care – PPO

## 2016-04-25 ENCOUNTER — Ambulatory Visit (INDEPENDENT_AMBULATORY_CARE_PROVIDER_SITE_OTHER): Payer: BC Managed Care – PPO | Admitting: Primary Care

## 2016-04-25 ENCOUNTER — Ambulatory Visit (INDEPENDENT_AMBULATORY_CARE_PROVIDER_SITE_OTHER): Payer: BC Managed Care – PPO

## 2016-04-25 ENCOUNTER — Other Ambulatory Visit: Payer: BC Managed Care – PPO

## 2016-04-25 ENCOUNTER — Encounter: Payer: Self-pay | Admitting: Primary Care

## 2016-04-25 VITALS — BP 112/68 | HR 54 | Temp 98.3°F | Ht 65.0 in | Wt 221.4 lb

## 2016-04-25 DIAGNOSIS — Z5181 Encounter for therapeutic drug level monitoring: Secondary | ICD-10-CM

## 2016-04-25 DIAGNOSIS — M2540 Effusion, unspecified joint: Secondary | ICD-10-CM

## 2016-04-25 DIAGNOSIS — M2560 Stiffness of unspecified joint, not elsewhere classified: Secondary | ICD-10-CM | POA: Diagnosis not present

## 2016-04-25 DIAGNOSIS — Z23 Encounter for immunization: Secondary | ICD-10-CM | POA: Diagnosis not present

## 2016-04-25 DIAGNOSIS — D6869 Other thrombophilia: Secondary | ICD-10-CM

## 2016-04-25 LAB — CBC
HCT: 37.6 % (ref 36.0–46.0)
Hemoglobin: 12.6 g/dL (ref 12.0–15.0)
MCHC: 33.6 g/dL (ref 30.0–36.0)
MCV: 78.5 fl (ref 78.0–100.0)
Platelets: 281 10*3/uL (ref 150.0–400.0)
RBC: 4.79 Mil/uL (ref 3.87–5.11)
RDW: 14.7 % (ref 11.5–15.5)
WBC: 3.7 10*3/uL — ABNORMAL LOW (ref 4.0–10.5)

## 2016-04-25 LAB — POCT INR: INR: 1.9

## 2016-04-25 LAB — SEDIMENTATION RATE: SED RATE: 41 mm/h — AB (ref 0–30)

## 2016-04-25 LAB — C-REACTIVE PROTEIN: CRP: 1 mg/dL (ref 0.5–20.0)

## 2016-04-25 NOTE — Progress Notes (Signed)
Subjective:    Patient ID: Theresa Lewis, female    DOB: [birth year], 55 y.o.   MRN: [id number]  HPI  Theresa Lewis is a 55 year old female who presents today with a chief complaint of joint pain. She also reports stiffness and swelling. Her symptoms are located to the joints of her bilateral hands which has been problematic for years.   She has a history of arthritis and has noticed increased pain with disfigurement to the right 5th digit and left 5th and 3rd digit. She was treated for suspected Trigeminal Neuralgia several months ago. She's undergone lab work for Rheumatoid arthritis in 2016 which was negative. She has a family history of rheumatoid arthritis in her mother. 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 She denies pain to the joints elsewhere.  Review of Systems  Musculoskeletal: Positive for arthralgias and joint swelling.  Skin: Negative for color change.  Neurological: Negative for weakness and numbness.       Past Medical History:  Diagnosis Date  . ABNORMAL VAGINAL BLEEDING 04/19/2007   Qualifier: Diagnosis of  By: Hulan Saas, CMA (AAMA), Quita Skye   . Deep venous thrombosis ([place])    2006    3 x clotting   . HEMORRHOIDS, WITH BLEEDING 12/26/2008   Qualifier: Diagnosis of  By: Regis Bill MD, [place]   . PE (pulmonary embolism) 07/09/2010  . Pulmonary embolism [facility])      Social History   Social History  . Marital status: Married    Spouse name: N/A  . Number of children: 1  . Years of education: N/A   Occupational History  . Scientist, water quality    Social History Main Topics  . Smoking status: Never Smoker  . Smokeless tobacco: Never Used  . Alcohol use 0.0 oz/week     Comment: rarely  . Drug use: No  . Sexual activity: Not on file   Other Topics Concern  . Not on file   Social History Narrative   HH of 3  Daughter 12 and self.  hh of 2    No pets.   No ets.   No etoh.    Working for [employer] and [employer] .  45- 50 hours per week.                 Past Surgical History:  Procedure Laterality Date  . MYOMECTOMY    . TUBAL LIGATION    . vena caval filter      Family History  Problem Relation Age of Onset  . Lung cancer Father 73    april 12   . Pancreatic cancer Father 9  . Hypertension Mother   . Rheum arthritis Mother   . Colon cancer Neg Hx   . Colon polyps Neg Hx   . Kidney disease Neg Hx   . Diabetes Paternal Aunt     x2  . Esophageal cancer Neg Hx   . Gallbladder disease Neg Hx     Allergies  Allergen Reactions  . Clindamycin/Lincomycin     "unknown"  . Tape Other (See Comments)    When pt has a band-aid on, the impression of band-aid stays on for a month    Current Outpatient Prescriptions on File Prior to Visit  Medication Sig Dispense Refill  . omeprazole (PRILOSEC) 20 MG capsule Take 1 capsule (20 mg total) by mouth daily. 90 capsule 1  . warfarin (COUMADIN) 5 MG tablet TAKE AS DIRECTED BY ANTI-COAGULATION  CLINIC. 200 tablet 0  . baclofen (LIORESAL) 10 MG tablet Take 1/2 tablet by mouth three times daily for 2 weeks. May increase to 10 mg every other day. (Patient not taking: Reported on 04/25/2016) 30 each 1  . [facility] STOOL SOFTENER 100 MG capsule TAKE 1 CAPSULE BY MOUTH AT BEDTIME AS NEEDED (Patient not taking: Reported on 04/25/2016) 30 capsule 0   No current facility-administered medications on file prior to visit.     BP 112/68   Pulse (!) 54   Temp 98.3 F (36.8 C) (Oral)   Ht 5\' 5"  (1.651 m)   Wt 221 lb 6.4 oz (100.4 kg)   SpO2 97%   BMI 36.84 kg/m    Objective:   Physical Exam  Constitutional: She appears well-nourished.  Neck: Neck supple.  Cardiovascular: Normal rate and regular rhythm.   Pulmonary/Chest: Effort normal and breath sounds normal.  Musculoskeletal:  Mild disfigurement to right 5th digit, left 3rd and 5th digits. Mild swelling to DIP joints of right 5th digit.  Skin: Skin is warm and dry.          Assessment & Plan:

## 2016-04-25 NOTE — Patient Instructions (Signed)
Complete lab work prior to leaving today. I will notify you of your results once received.   Start Tylenol Arthritis for pain. Follow the package instructions.  You will be contacted regarding your referral toRheumatology.  Please let us know if you have not heard back within one week.   It was a pleasure to see you today!

## 2016-04-25 NOTE — Assessment & Plan Note (Signed)
Also with disfigurement and stiffness. Exam today consistent with at least osteoarthritis, possibly RA. Will repeat labs for RA. Given disfigurement and increased stiffness, will send to rheumatology for further evaluation. Discussed use of tylenol arthritis.

## 2016-04-25 NOTE — Progress Notes (Signed)
Pre visit review using our clinic review tool, if applicable. No additional management support is needed unless otherwise documented below in the visit note. 

## 2016-04-25 NOTE — Addendum Note (Signed)
Addended by: Magdalen Spatz C on: 04/25/2016 09:19 AM   Modules accepted: Orders

## 2016-04-25 NOTE — Patient Instructions (Signed)
Pre visit review using our clinic review tool, if applicable. No additional management support is needed unless otherwise documented below in the visit note. 

## 2016-04-28 LAB — RHEUMATOID FACTOR: Rhuematoid fact SerPl-aCnc: <14 IU/mL (ref <14)

## 2016-04-28 LAB — ANTI-NUCLEAR AB-TITER (ANA TITER)

## 2016-04-28 LAB — ANA: Anti Nuclear Antibody(ANA): POSITIVE — AB

## 2016-05-06 ENCOUNTER — Ambulatory Visit (INDEPENDENT_AMBULATORY_CARE_PROVIDER_SITE_OTHER): Payer: BC Managed Care – PPO

## 2016-05-06 DIAGNOSIS — Z86718 Personal history of other venous thrombosis and embolism: Secondary | ICD-10-CM

## 2016-05-06 DIAGNOSIS — D6869 Other thrombophilia: Secondary | ICD-10-CM

## 2016-05-06 DIAGNOSIS — Z5181 Encounter for therapeutic drug level monitoring: Secondary | ICD-10-CM

## 2016-05-06 LAB — POCT INR: INR: 4.6

## 2016-05-06 NOTE — Patient Instructions (Signed)
Pre visit review using our clinic review tool, if applicable. No additional management support is needed unless otherwise documented below in the visit note.  Patient INR today:  4.6  She denies any changes in health, medications or diet but does state this is a pattern at times for her to have erratic fluctuations without cause or reason.  Patient is to hold coumadin today and tomorrow 12/12, 12/13 and then decrease daily dosing to 10mg  daily and recheck in 1-2 weeks. Patient dosing history reviewed and determined this is safest dosing decrease.  We reviewed risks associated with supratherapeutic level and to go to ER if any unusual or uncontrolled bleeding develops.  She denies any unusual bruising or blood in the stool and was encouraged to eat a couple of servings of greens over next 48 hours.  Patient verbalizes understanding of all instructions given today.

## 2016-05-13 ENCOUNTER — Other Ambulatory Visit: Payer: Self-pay | Admitting: Primary Care

## 2016-05-16 ENCOUNTER — Ambulatory Visit: Payer: BC Managed Care – PPO

## 2016-10-30 ENCOUNTER — Other Ambulatory Visit: Payer: Self-pay | Admitting: Primary Care

## 2016-10-30 NOTE — Telephone Encounter (Signed)
Patient cancelled her last coumadin check and has not been seen since December!  INR was 4.6 at that time!  I spoke with patient and made her an appointment with me tomorrow in coumadin clinic 10/31/16.  I will not refill her r/x until she is seen and agrees to be compliant with monthly checks.  Patient verbalizes understanding.

## 2016-10-31 ENCOUNTER — Ambulatory Visit (INDEPENDENT_AMBULATORY_CARE_PROVIDER_SITE_OTHER): Payer: BC Managed Care – PPO

## 2016-10-31 DIAGNOSIS — D6869 Other thrombophilia: Secondary | ICD-10-CM

## 2016-10-31 DIAGNOSIS — Z5181 Encounter for therapeutic drug level monitoring: Secondary | ICD-10-CM | POA: Diagnosis not present

## 2016-10-31 LAB — POCT INR: INR: 1.3

## 2016-10-31 NOTE — Patient Instructions (Signed)
Pre visit review using our clinic review tool, if applicable. No additional management support is needed unless otherwise documented below in the visit note.  INR today 1.3  Patient has not been compliant with coumadin checks as she has not been keeping appointments with me here in the clinic.  Last check prior to today was December 2017.  I refused to fill coumadin until she was seen and patient came in immediately today for resumption of care.  Patient has been educated on the seriousness and importance of keeping regular, routine checks and understands that we cannot continue to fill her medications if she does not come in.  She verbalizes understanding and agrees to keep appointments in the future.  Patient reports being off coumadin X 3 days but she was taking 10mg  qd prior to that.  She denies any unusual symptoms and understands the risks associated with a subtherapeutic level.  She will go to ER if any concerns develop.  Instructions given to have patient take 12.5 mg today (6/8), tomorrow (6/9), Sunday (6/10) and Monday (6/11) and then resume 10mg  daily until recheck in 1 week on 11/07/16.  Patient agrees to regimen and to f/u next week.  This Probation officer filled coumadin r/x for 1 month only to ensure that patient is compliant with ongoing coumadin clinic appointments.

## 2016-10-31 NOTE — Telephone Encounter (Signed)
Patient here today for INR check.  INR 1.3.    Dosing adjustments made and refilled R/X X 1 month  to ensure patient continues to follow up as appropriate for management.

## 2016-11-03 NOTE — Progress Notes (Signed)
agree.  thanks.  

## 2016-11-07 ENCOUNTER — Ambulatory Visit (INDEPENDENT_AMBULATORY_CARE_PROVIDER_SITE_OTHER): Payer: BC Managed Care – PPO

## 2016-11-07 DIAGNOSIS — Z5181 Encounter for therapeutic drug level monitoring: Secondary | ICD-10-CM | POA: Diagnosis not present

## 2016-11-07 DIAGNOSIS — D6869 Other thrombophilia: Secondary | ICD-10-CM | POA: Diagnosis not present

## 2016-11-07 LAB — POCT INR: INR: 1.9

## 2016-11-07 NOTE — Patient Instructions (Signed)
Pre visit review using our clinic review tool, if applicable. No additional management support is needed unless otherwise documented below in the visit note.   INR today 1.9  Patient still running subtherapeutic from 3 consecutive missed doses 1- 2 weeks ago.  Patient denies any other concerns or changes in diet, health or medication regimen.  She is to take 15mg  (3 pills) today (6/15) and tomorrow (6/16) and then start increased dosing of 10mg  daily EXCEPT or 15mg  on Wednesdays.  Recheck in 2 weeks.  Patient verbalizes understanding of instructions given today and risks associated with subtherapeutic dose, will go to ER if she has any concerns.

## 2016-11-09 ENCOUNTER — Other Ambulatory Visit: Payer: Self-pay | Admitting: Primary Care

## 2016-11-21 ENCOUNTER — Ambulatory Visit (INDEPENDENT_AMBULATORY_CARE_PROVIDER_SITE_OTHER): Payer: BC Managed Care – PPO

## 2016-11-21 DIAGNOSIS — D6869 Other thrombophilia: Secondary | ICD-10-CM | POA: Diagnosis not present

## 2016-11-21 DIAGNOSIS — Z5181 Encounter for therapeutic drug level monitoring: Secondary | ICD-10-CM | POA: Diagnosis not present

## 2016-11-21 LAB — POCT INR: INR: 4.5

## 2016-11-21 NOTE — Patient Instructions (Signed)
Pre visit review using our clinic review tool, if applicable. No additional management support is needed unless otherwise documented below in the visit note.  INR today 4.5  GOAL RANGE: 2.5-3.5  Patient denies any changes to health, medication or diet.  In addition, she has noticed no abnormal bruising or bleeding.  Patient is to hold coumadin today (6/29) and then start decreased dosing of 2pills (10mg ) daily.  Recheck in 2 weeks.  Patient educated on and verbalizes understanding of all risks associated with supratherapeutic level and will go to ER if any concerns develop.

## 2016-11-23 NOTE — Progress Notes (Signed)
Agree. Thanks

## 2016-11-27 ENCOUNTER — Other Ambulatory Visit: Payer: Self-pay | Admitting: Primary Care

## 2016-11-27 NOTE — Telephone Encounter (Signed)
Noted and reviewed last coumadin clinic visit. Refill sent to pharmacy.

## 2016-11-27 NOTE — Telephone Encounter (Signed)
Ok to refill? Electronically refill request for warfarin (COUMADIN) 5 MG tablet.  Last prescribed on 10/31/2016. Patient is being seen at the coumadin clinic.

## 2016-12-05 ENCOUNTER — Ambulatory Visit (INDEPENDENT_AMBULATORY_CARE_PROVIDER_SITE_OTHER): Payer: BC Managed Care – PPO

## 2016-12-05 DIAGNOSIS — Z5181 Encounter for therapeutic drug level monitoring: Secondary | ICD-10-CM | POA: Diagnosis not present

## 2016-12-05 DIAGNOSIS — D6869 Other thrombophilia: Secondary | ICD-10-CM

## 2016-12-05 LAB — POCT INR: INR: 3.8

## 2016-12-05 NOTE — Patient Instructions (Signed)
Pre visit review using our clinic review tool, if applicable. No additional management support is needed unless otherwise documented below in the visit note. 

## 2016-12-26 ENCOUNTER — Ambulatory Visit (INDEPENDENT_AMBULATORY_CARE_PROVIDER_SITE_OTHER): Payer: BC Managed Care – PPO

## 2016-12-26 DIAGNOSIS — D6869 Other thrombophilia: Secondary | ICD-10-CM

## 2016-12-26 DIAGNOSIS — Z5181 Encounter for therapeutic drug level monitoring: Secondary | ICD-10-CM

## 2016-12-26 LAB — POCT INR: INR: 1.9

## 2016-12-26 NOTE — Patient Instructions (Signed)
Pre visit review using our clinic review tool, if applicable. No additional management support is needed unless otherwise documented below in the visit note. 

## 2016-12-27 ENCOUNTER — Other Ambulatory Visit: Payer: Self-pay | Admitting: Primary Care

## 2017-01-13 ENCOUNTER — Telehealth: Payer: Self-pay | Admitting: Primary Care

## 2017-01-13 NOTE — Telephone Encounter (Addendum)
Yes, that is fine, patient cannot come any earlier.  Notified patient.

## 2017-01-13 NOTE — Telephone Encounter (Signed)
FYI  Pt called to change her coumadin appointment from 8/24 to 9/4 is it ok to wait that long.    Best number [phone]

## 2017-01-16 ENCOUNTER — Ambulatory Visit: Payer: BC Managed Care – PPO

## 2017-01-27 ENCOUNTER — Ambulatory Visit (INDEPENDENT_AMBULATORY_CARE_PROVIDER_SITE_OTHER): Payer: BC Managed Care – PPO

## 2017-01-27 DIAGNOSIS — D6869 Other thrombophilia: Secondary | ICD-10-CM | POA: Diagnosis not present

## 2017-01-27 DIAGNOSIS — Z5181 Encounter for therapeutic drug level monitoring: Secondary | ICD-10-CM | POA: Diagnosis not present

## 2017-01-27 LAB — POCT INR
INR: 5.2
INR: 6.8

## 2017-01-27 LAB — PROTIME-INR
INR: 5.2 ratio — AB (ref 0.8–1.0)
Prothrombin Time: 55.1 s (ref 9.6–13.1)

## 2017-01-27 NOTE — Patient Instructions (Signed)
Pre visit review using our clinic review tool, if applicable. No additional management support is needed unless otherwise documented below in the visit note.   INR today 6.8 and then confirmed by venipuncture:  5.2   Reviewed patient's diet, health and medication history.  Patient has recently started daily use of omeprazole which can cause an increase in warfarin effects.  Patient educated on risks associated with supratherapeutic level and will go to ER if any concerns develop.  She denies any unusual bruising or bleeding.    Patient instructed to hold coumadin today (9/4) and tomorrow 9/5 and then restart decreased dosing of 2pills (10mg ) daily EXCEPT for 1 pill (5mg ) on Mondays and Fridays.  Recheck in 1-2 weeks.  Patient chose 2 weeks due to inability to come on 02/03/17.  Patient verbalizes understanding of all instructions given today.

## 2017-02-02 ENCOUNTER — Other Ambulatory Visit: Payer: Self-pay | Admitting: Primary Care

## 2017-02-02 NOTE — Telephone Encounter (Signed)
Ok to refill? Electronically refill request for warfarin (COUMADIN) 5 MG tablet  Last prescribed on 12/29/2016.

## 2017-02-02 NOTE — Telephone Encounter (Signed)
Refill sent to pharmacy. Last coumadin clinic note reviewed.

## 2017-02-06 ENCOUNTER — Ambulatory Visit: Payer: BC Managed Care – PPO

## 2017-02-10 ENCOUNTER — Ambulatory Visit (INDEPENDENT_AMBULATORY_CARE_PROVIDER_SITE_OTHER): Payer: BC Managed Care – PPO

## 2017-02-10 DIAGNOSIS — D6869 Other thrombophilia: Secondary | ICD-10-CM

## 2017-02-10 DIAGNOSIS — Z5181 Encounter for therapeutic drug level monitoring: Secondary | ICD-10-CM

## 2017-02-10 LAB — POCT INR: INR: 5.6

## 2017-02-10 NOTE — Patient Instructions (Signed)
Pre visit review using our clinic review tool, if applicable. No additional management support is needed unless otherwise documented below in the visit note.  INR today 5.6   Reviewed patient's diet, health and medication history.  She denies any changes or deviation in dosing schedule other than she is now taking Omeprazole on a consistent daily basis.  This is new in past 2 weeks.  She has started using a pill box daily to help her keep a close watch on her schedule.  We held for 2 days and decreased dosing by 10mg  at last appointment and INR still up from a 5.2 - 5.6.  She denies any bleeding or bruising issues but is aware of risks associated with a supratherapeutic level and will go to ER if any concerns develop.  Currently patient is to hold her coumadin X 3 days (9/18, 9/19 and 9/20) and then start decreased dosing of 1 pill (5mg ) daily EXCEPT for 2 pills (10mg ) on Sundays, Tuesdays and Thursdays only.  Recheck in 1 week.  Patient verbalizes understanding and confirms dosing schedule back to me.

## 2017-02-17 ENCOUNTER — Ambulatory Visit (INDEPENDENT_AMBULATORY_CARE_PROVIDER_SITE_OTHER): Payer: BC Managed Care – PPO

## 2017-02-17 DIAGNOSIS — Z5181 Encounter for therapeutic drug level monitoring: Secondary | ICD-10-CM

## 2017-02-17 DIAGNOSIS — D6869 Other thrombophilia: Secondary | ICD-10-CM

## 2017-02-17 LAB — POCT INR: INR: 1.8

## 2017-02-17 NOTE — Patient Instructions (Signed)
Pre visit review using our clinic review tool, if applicable. No additional management support is needed unless otherwise documented below in the visit note. 

## 2017-02-27 ENCOUNTER — Ambulatory Visit (INDEPENDENT_AMBULATORY_CARE_PROVIDER_SITE_OTHER): Payer: BC Managed Care – PPO

## 2017-02-27 DIAGNOSIS — D6869 Other thrombophilia: Secondary | ICD-10-CM

## 2017-02-27 DIAGNOSIS — Z7901 Long term (current) use of anticoagulants: Secondary | ICD-10-CM

## 2017-02-27 DIAGNOSIS — Z5181 Encounter for therapeutic drug level monitoring: Secondary | ICD-10-CM | POA: Diagnosis not present

## 2017-02-27 LAB — POCT INR: INR: 4.5

## 2017-02-27 NOTE — Patient Instructions (Signed)
Pre visit review using our clinic review tool, if applicable. No additional management support is needed unless otherwise documented below in the visit note. 

## 2017-03-13 ENCOUNTER — Ambulatory Visit (INDEPENDENT_AMBULATORY_CARE_PROVIDER_SITE_OTHER): Payer: BC Managed Care – PPO

## 2017-03-13 DIAGNOSIS — Z7901 Long term (current) use of anticoagulants: Secondary | ICD-10-CM

## 2017-03-13 DIAGNOSIS — D6869 Other thrombophilia: Secondary | ICD-10-CM

## 2017-03-13 LAB — POCT INR: INR: 2.6

## 2017-03-13 NOTE — Patient Instructions (Signed)
Pre visit review using our clinic review tool, if applicable. No additional management support is needed unless otherwise documented below in the visit note. 

## 2017-04-01 ENCOUNTER — Other Ambulatory Visit: Payer: Self-pay | Admitting: Primary Care

## 2017-04-01 DIAGNOSIS — M792 Neuralgia and neuritis, unspecified: Secondary | ICD-10-CM

## 2017-04-02 ENCOUNTER — Telehealth: Payer: Self-pay

## 2017-04-02 NOTE — Telephone Encounter (Signed)
Copied from [place] [id number]. Topic: General - Other >> Apr 01, 2017  9:53 AM Ahmed Prima L wrote: Reason for CRM:  Patient called and thought she had an appt this Friday with [facility] Bradley Bostelman, there was nothing scheduled for Friday. She said she wants mandy to call her back so she can make that appt for next week.

## 2017-04-02 NOTE — Telephone Encounter (Signed)
LM for patient to let me know whether she would like to be seen tomorrow (04/03/17) at 8:30 am or 2:00pm and I will work her in.

## 2017-04-06 ENCOUNTER — Telehealth: Payer: Self-pay | Admitting: Primary Care

## 2017-04-06 NOTE — Telephone Encounter (Signed)
Copied from [place] [id number]. Topic: Quick Communication - See Telephone Encounter >> Apr 06, 2017  7:19 PM Cecelia Byars, NT wrote: CRM for notification. See Telephone encounter for:  Please give message to   Theresa Lewis, patient says trigeminal  neuralgia, has returned will come in if she needs to please call [phone], will be cruise on Monday   Will be there for a 4:30 appointment to get a finger prick 04/06/17.

## 2017-04-06 NOTE — Telephone Encounter (Signed)
Copied from [place] [id number]. Topic: Quick Communication - See Telephone Encounter >> Apr 06, 2017  7:19 PM Cecelia Byars, NT wrote: CRM for notification. See Telephone encounter for:  Please give message to   Allie Bossier, patient says trigeminal  neuralgia, has returned will come in if she needs to please call [phone], will be cruise on Monday   Will be there for a 4:30 appointment to get a finger prick 04/06/17.

## 2017-04-07 ENCOUNTER — Ambulatory Visit: Payer: BC Managed Care – PPO

## 2017-04-07 DIAGNOSIS — Z7901 Long term (current) use of anticoagulants: Secondary | ICD-10-CM

## 2017-04-07 DIAGNOSIS — D6869 Other thrombophilia: Secondary | ICD-10-CM

## 2017-04-07 LAB — POCT INR: INR: 2.5

## 2017-04-07 NOTE — Telephone Encounter (Signed)
Yes, needs follow up office visit. Please schedule.

## 2017-04-07 NOTE — Telephone Encounter (Signed)
Note: patient is coming in today for INR check but would like treatment for return of trigeminal neuralgia, leaving for a cruise on Monday.  Do you need to see her first?

## 2017-04-07 NOTE — Patient Instructions (Signed)
INR today 2.5  Continue taking 1 pill (5mg ) daily EXCEPT for 2 pills (10mg ) on Sunday, Tuesday, Thursday.  Recheck in 4 weeks.

## 2017-04-07 NOTE — Telephone Encounter (Signed)
Yes, I need to see her. Please see other phone notes.

## 2017-04-07 NOTE — Telephone Encounter (Signed)
I apologize. Did not see there was another telephone encounter with information.  Will discuss with patient when she comes in today that she needs to schedule an appointment with a provider for eval.  Thanks.

## 2017-04-07 NOTE — Telephone Encounter (Signed)
I will speak with patient about this today at 4:30pm when she comes for her INR appt.  Dr. Darnell Level has an opening tomorrow and Avie Echevaria, NP has some openings on Thursday.  Will see what she can do as she leaves on Monday for a cruise.  Allie Bossier, NP is booked out for the remainder of the week.   Thanks.

## 2017-04-07 NOTE — Telephone Encounter (Signed)
Noted  

## 2017-04-08 ENCOUNTER — Ambulatory Visit: Payer: BC Managed Care – PPO | Admitting: Family Medicine

## 2017-04-09 ENCOUNTER — Encounter: Payer: Self-pay | Admitting: Internal Medicine

## 2017-04-09 ENCOUNTER — Ambulatory Visit: Payer: BC Managed Care – PPO | Admitting: Internal Medicine

## 2017-04-09 DIAGNOSIS — G500 Trigeminal neuralgia: Secondary | ICD-10-CM

## 2017-04-09 MED ORDER — BACLOFEN 10 MG PO TABS: ORAL_TABLET | ORAL | 0 refills | Status: DC

## 2017-04-09 NOTE — Progress Notes (Signed)
Subjective:    Patient ID: Theresa Lewis, female    DOB: August 17, 1960, 56 y.o.   MRN: [id number]  HPI  Pt presents to the clinic today with c/o reoccurring trigeminal neuralgia. She reports this started 2 days ago. She describes the pain as burning and tingling. She has had good relief with Baclofen in the past, but reports she is out of her medication and would like a refill today.  Review of Systems  Past Medical History:  Diagnosis Date  . ABNORMAL VAGINAL BLEEDING 04/19/2007   Qualifier: Diagnosis of  By: Hulan Saas, CMA (AAMA), Quita Skye   . Deep venous thrombosis ([place])    2006    3 x clotting   . HEMORRHOIDS, WITH BLEEDING 12/26/2008   Qualifier: Diagnosis of  By: Regis Bill MD, Standley Brooking   . PE (pulmonary embolism) 07/09/2010  . Pulmonary embolism ([place])     Current Outpatient Medications  Medication Sig Dispense Refill  . baclofen (LIORESAL) 10 MG tablet Take 1/2 tablet by mouth three times daily for 2 weeks. May increase to 10 mg every other day. 30 each 1  . [facility] STOOL SOFTENER 100 MG capsule TAKE 1 CAPSULE BY MOUTH AT BEDTIME AS NEEDED 30 capsule 0  . omeprazole (PRILOSEC) 20 MG capsule TAKE 1 CAPSULE (20 MG TOTAL) BY MOUTH DAILY. 90 capsule 1  . warfarin (COUMADIN) 5 MG tablet TAKE AS DIRECTED BY ANTI-COAGULATION CLINIC. 70 tablet 0   No current facility-administered medications for this visit.     Allergies  Allergen Reactions  . Clindamycin/Lincomycin     "unknown"  . Tape Other (See Comments)    When pt has a band-aid on, the impression of band-aid stays on for a month    Family History  Problem Relation Age of Onset  . Lung cancer Father 73       april 12   . Pancreatic cancer Father 14  . Hypertension Mother   . Rheum arthritis Mother   . Colon cancer Neg Hx   . Colon polyps Neg Hx   . Kidney disease Neg Hx   . Diabetes Paternal Aunt        x2  . Esophageal cancer Neg Hx   . Gallbladder disease Neg Hx     Social History   Socioeconomic History  .  Marital status: Married    Spouse name: Not on file  . Number of children: 1  . Years of education: Not on file  . Highest education level: Not on file  Social Needs  . Financial resource strain: Not on file  . Food insecurity - worry: Not on file  . Food insecurity - inability: Not on file  . Transportation needs - medical: Not on file  . Transportation needs - non-medical: Not on file  Occupational History  . Occupation: Scientist, water quality  Tobacco Use  . Smoking status: Never Smoker  . Smokeless tobacco: Never Used  Substance and Sexual Activity  . Alcohol use: Yes    Alcohol/week: 0.0 oz    Comment: rarely  . Drug use: No  . Sexual activity: Not on file  Other Topics Concern  . Not on file  Social History Narrative   HH of 3  Daughter 30 and self.  hh of 2    No pets.   No ets.   No etoh.    Working for [employer] and [employer] .  45- 50 hours per week.  Constitutional: Denies fever, malaise, fatigue, headache or abrupt weight changes.  Neurological: Pt reports nerve pain of face. Denies dizziness, difficulty with memory, difficulty with speech or problems with balance and coordination.    No other specific complaints in a complete review of systems (except as listed in HPI above).     Objective:   Physical Exam  BP 116/78   Pulse 60   Temp 97.9 F (36.6 C) (Oral)   Wt 223 lb (101.2 kg)   BMI 37.11 kg/m  Wt Readings from Last 3 Encounters:  04/09/17 223 lb (101.2 kg)  04/25/16 221 lb 6.4 oz (100.4 kg)  09/19/15 218 lb 1.9 oz (98.9 kg)    General: Appears her  stated age, well developed, well nourished in NAD. Neurological: Alert and oriented. Pain along the mandible.   BMET    Component Value Date/Time   NA 143 09/19/2015 1003   K 4.4 09/19/2015 1003   CL 109 09/19/2015 1003   CO2 29 09/19/2015 1003   GLUCOSE 90 09/19/2015 1003   BUN 13 09/19/2015 1003   CREATININE 0.92 09/19/2015 1003   CALCIUM 9.5 09/19/2015 1003   GFRNONAA  >90 07/21/2011 1352   GFRAA >90 07/21/2011 1352    Lipid Panel     Component Value Date/Time   CHOL 169 01/26/2015 0809   TRIG 106.0 01/26/2015 0809   HDL 43.90 01/26/2015 0809   CHOLHDL 4 01/26/2015 0809   VLDL 21.2 01/26/2015 0809   LDLCALC 104 (H) 01/26/2015 0809    CBC    Component Value Date/Time   WBC 3.7 (L) 04/25/2016 0851   RBC 4.79 04/25/2016 0851   HGB 12.6 04/25/2016 0851   HCT 37.6 04/25/2016 0851   PLT 281.0 04/25/2016 0851   MCV 78.5 04/25/2016 0851   MCH 26.1 07/21/2011 1352   MCHC 33.6 04/25/2016 0851   RDW 14.7 04/25/2016 0851   LYMPHSABS 1.0 06/22/2012 0818   MONOABS 0.2 06/22/2012 0818   EOSABS 0.1 06/22/2012 0818   BASOSABS 0.0 06/22/2012 0818    Hgb A1C Lab Results  Component Value Date   HGBA1C 5.8 01/26/2015            Assessment & Plan:   Trigeminal Neuralgia:  Baclofen refilled today  Return precautions discussed Webb Silversmith, NP

## 2017-04-09 NOTE — Patient Instructions (Signed)
Trigeminal Neuralgia Trigeminal neuralgia is a nerve disorder that causes attacks of severe facial pain. The attacks last from a few seconds to several minutes. They can happen for days, weeks, or months and then go away for months or years. Trigeminal neuralgia is also called tic douloureux. What are the causes? This condition is caused by damage to a nerve in the face that is called the trigeminal nerve. An attack can be triggered by:  Talking.  Chewing.  Putting on makeup.  Washing your face.  Shaving your face.  Brushing your teeth.  Touching your face.  What increases the risk? This condition is more likely to develop in:  Women.  People who are 50 years of age or older.  What are the signs or symptoms? The main symptom of this condition is pain in the jaw, lips, eyes, nose, scalp, forehead, and face. The pain may be intense, stabbing, electric, or shock-like. How is this diagnosed? This condition is diagnosed with a physical exam. A CT scan or MRI may be done to rule out other conditions that can cause facial pain. How is this treated? This condition may be treated with:  Avoiding the things that trigger your attacks.  Pain medicine.  Surgery. This may be done in severe cases if other medical treatment does not provide relief.  Follow these instructions at home:  Take over-the-counter and prescription medicines only as told by your health care provider.  If you wish to get pregnant, talk with your health care provider before you start trying to get pregnant.  Avoid the things that trigger your attacks. It may help to: ? Chew on the unaffected side of your mouth. ? Avoid touching your face. ? Avoid blasts of hot or cold air. Contact a health care provider if:  Your pain medicine is not helping.  You develop new, unexplained symptoms, such as: ? Double vision. ? Facial weakness. ? Changes in hearing or balance.  You become pregnant. Get help right away  if:  Your pain is unbearable, and your pain medicine does not help. This information is not intended to replace advice given to you by your health care provider. Make sure you discuss any questions you have with your health care provider. Document Released: 05/09/2000 Document Revised: 01/13/2016 Document Reviewed: 09/04/2014 Elsevier Interactive Patient Education  2018 Elsevier Inc.  

## 2017-05-05 ENCOUNTER — Ambulatory Visit: Payer: BC Managed Care – PPO

## 2017-05-14 ENCOUNTER — Ambulatory Visit: Payer: BC Managed Care – PPO | Admitting: General Practice

## 2017-05-14 DIAGNOSIS — Z7901 Long term (current) use of anticoagulants: Secondary | ICD-10-CM

## 2017-05-14 DIAGNOSIS — D6869 Other thrombophilia: Secondary | ICD-10-CM

## 2017-05-14 LAB — POCT INR: INR: 1.8

## 2017-05-14 NOTE — Patient Instructions (Addendum)
Pre visit review using our clinic review tool, if applicable. No additional management support is needed unless otherwise documented below in the visit note.  Take 3 tablets today (12/20) and take 3 tablets tomorrow (12/21) and then continue taking 1 pill (5mg ) daily EXCEPT for 2 pills (10mg ) on Sunday, Tuesday, Thursday.  Recheck in 2 weeks.

## 2017-05-28 ENCOUNTER — Other Ambulatory Visit: Payer: Self-pay | Admitting: General Practice

## 2017-05-28 ENCOUNTER — Ambulatory Visit (INDEPENDENT_AMBULATORY_CARE_PROVIDER_SITE_OTHER): Payer: BC Managed Care – PPO | Admitting: General Practice

## 2017-05-28 DIAGNOSIS — D6869 Other thrombophilia: Secondary | ICD-10-CM

## 2017-05-28 DIAGNOSIS — Z7901 Long term (current) use of anticoagulants: Secondary | ICD-10-CM | POA: Diagnosis not present

## 2017-05-28 LAB — POCT INR: INR: 1.8

## 2017-05-28 MED ORDER — WARFARIN SODIUM 5 MG PO TABS: ORAL_TABLET | ORAL | 0 refills | Status: DC

## 2017-05-28 NOTE — Patient Instructions (Addendum)
Pre visit review using our clinic review tool, if applicable. No additional management support is needed unless otherwise documented below in the visit note.  Take 3 tablets today (1/3) and take 2 tablets tomorrow (1/4) and then take 2 pills (10mg ) daily EXCEPT for 1 pills (5mg ) on Mondays/Wednesday and Fridays.  Re-check in 3 weeks.

## 2017-06-18 ENCOUNTER — Ambulatory Visit: Payer: BC Managed Care – PPO

## 2017-06-18 ENCOUNTER — Ambulatory Visit: Payer: BC Managed Care – PPO | Admitting: General Practice

## 2017-06-18 DIAGNOSIS — Z7901 Long term (current) use of anticoagulants: Secondary | ICD-10-CM | POA: Diagnosis not present

## 2017-06-18 DIAGNOSIS — D6869 Other thrombophilia: Secondary | ICD-10-CM

## 2017-06-18 LAB — POCT INR: INR: 2.2

## 2017-06-18 NOTE — Patient Instructions (Addendum)
Pre visit review using our clinic review tool, if applicable. No additional management support is needed unless otherwise documented below in the visit note.  Take 3 tablets today (1/24) and then change dosage and take 2 pills (10mg ) daily EXCEPT for 1 pills (5mg ) on Mondays/Fridays.  Re-check in 3 weeks.

## 2017-06-25 ENCOUNTER — Other Ambulatory Visit: Payer: Self-pay | Admitting: Primary Care

## 2017-06-25 DIAGNOSIS — D6869 Other thrombophilia: Secondary | ICD-10-CM

## 2017-06-25 DIAGNOSIS — Z86718 Personal history of other venous thrombosis and embolism: Secondary | ICD-10-CM

## 2017-06-25 NOTE — Telephone Encounter (Signed)
Patient is over due for follow up with me, please schedule follow up visit. Will refill for 30 day supply.

## 2017-06-25 NOTE — Telephone Encounter (Signed)
Ok to refill? Electronically refill request for warfarin (COUMADIN) 5 MG tablet  Last prescribed on 05/28/2017. Last seen on 06/18/2017

## 2017-06-29 NOTE — Telephone Encounter (Signed)
Message left for patient to return my call.  Send message through MyChart and sending letter as a reminder to schedule appointment.

## 2017-07-07 ENCOUNTER — Ambulatory Visit: Payer: BC Managed Care – PPO

## 2017-07-07 DIAGNOSIS — Z7901 Long term (current) use of anticoagulants: Secondary | ICD-10-CM

## 2017-07-07 DIAGNOSIS — D6869 Other thrombophilia: Secondary | ICD-10-CM

## 2017-07-07 LAB — POCT INR
INR: 3.8
INR: 5.1

## 2017-07-07 LAB — PROTIME-INR
INR: 5.1 ratio — ABNORMAL HIGH (ref 0.8–1.0)
Prothrombin Time: 54.4 s (ref 9.6–13.1)

## 2017-07-07 NOTE — Patient Instructions (Addendum)
  Note: patient's initial fingerstick resulted at 5.2.  Test repeated after meter inspected, cleaned and strip lot numbers changed, result at this time is noted at 3.8.  Since there is a big disparity between numbers, stat venipuncture ordered through our lab to verify reading.  Patient is doing well without any change in diet, health or medication history.  She denies any unusual bruising or bleeding and is aware of risks associated with a supratherapeutic reading and will go to the ER if any concerns develop.  Repeat INR via venipuncture:  5.1  Hold coumadin today (2/12) and tomorrow (2/13) and then decrease weekly dosing back to 10mg  daily EXCEPT for 5mg  on Mon, Wed and Fridays.  Recheck in 2 weeks.

## 2017-07-16 ENCOUNTER — Telehealth: Payer: Self-pay

## 2017-07-16 DIAGNOSIS — Z86718 Personal history of other venous thrombosis and embolism: Secondary | ICD-10-CM

## 2017-07-16 DIAGNOSIS — Z7901 Long term (current) use of anticoagulants: Secondary | ICD-10-CM

## 2017-07-16 DIAGNOSIS — D6869 Other thrombophilia: Secondary | ICD-10-CM

## 2017-07-16 DIAGNOSIS — I2699 Other pulmonary embolism without acute cor pulmonale: Secondary | ICD-10-CM

## 2017-07-16 NOTE — Telephone Encounter (Signed)
Noted, will await results. Agree with plan.

## 2017-07-16 NOTE — Telephone Encounter (Signed)
Called patient regarding her 07/21/17 clinic appointment.  Coumadin  Clinic will be closed on Tuesday so patient needs to be rescheduled for lab only and will call with results.  Note: while discussing with patient she mentions that she has the flu and has been taking Nyquil and several other cold medicines which could have increasing effects on the warfarin.  She also was 5.1 at last visit.  She was educated on risks of medications and encouraged not to use any further.  She states that she is on the end of her sickness.  Patient instructed to hold coumadin today and cut back to 1 (5mg ) pill daily until recheck this Monday 07/20/17.  She will go to the lab and I have put in STAT orders to have drawn. Patient verbalizes understanding of all instructions given today. I will be out of the office on Monday and Tuesday with limited access to computer but will notify NP to watch for results to communicate with patient.     Thanks.

## 2017-07-20 ENCOUNTER — Other Ambulatory Visit: Payer: BC Managed Care – PPO

## 2017-07-21 ENCOUNTER — Ambulatory Visit: Payer: BC Managed Care – PPO

## 2017-07-22 NOTE — Telephone Encounter (Signed)
Does not appear that patient came for lab appointment on Monday morning.  I have called and left her a message that we need immediate check tomorrow (07/23/17) and to please call back and make an appt with the coumadin clinic.

## 2017-07-23 ENCOUNTER — Other Ambulatory Visit (INDEPENDENT_AMBULATORY_CARE_PROVIDER_SITE_OTHER): Payer: BC Managed Care – PPO

## 2017-07-23 ENCOUNTER — Ambulatory Visit (INDEPENDENT_AMBULATORY_CARE_PROVIDER_SITE_OTHER): Payer: BC Managed Care – PPO | Admitting: General Practice

## 2017-07-23 DIAGNOSIS — Z86718 Personal history of other venous thrombosis and embolism: Secondary | ICD-10-CM

## 2017-07-23 DIAGNOSIS — Z7901 Long term (current) use of anticoagulants: Secondary | ICD-10-CM

## 2017-07-23 DIAGNOSIS — Z5181 Encounter for therapeutic drug level monitoring: Secondary | ICD-10-CM | POA: Diagnosis not present

## 2017-07-23 DIAGNOSIS — D6869 Other thrombophilia: Secondary | ICD-10-CM

## 2017-07-23 LAB — POCT INR: INR: 1.7

## 2017-07-23 NOTE — Patient Instructions (Signed)
Pre visit review using our clinic review tool, if applicable. No additional management support is needed unless otherwise documented below in the visit note.  Take 10 mg today and tomorrow and then resume taking 10 daily except 5 mg on Monday/Wed/Fridays.  Re-check in 1 week.

## 2017-07-30 ENCOUNTER — Ambulatory Visit: Payer: BC Managed Care – PPO | Admitting: General Practice

## 2017-07-30 DIAGNOSIS — Z7901 Long term (current) use of anticoagulants: Secondary | ICD-10-CM

## 2017-07-30 DIAGNOSIS — D6869 Other thrombophilia: Secondary | ICD-10-CM

## 2017-07-30 LAB — POCT INR: INR: 1.8

## 2017-07-30 NOTE — Patient Instructions (Addendum)
Pre visit review using our clinic review tool, if applicable. No additional management support is needed unless otherwise documented below in the visit note.  Take 15 mg today (3/7) and take 10 mg tomorrow (3/8) and then resume taking 10 daily except 5 mg on Monday/Wed/Fridays.  Re-check in 1 week.

## 2017-08-06 ENCOUNTER — Ambulatory Visit (INDEPENDENT_AMBULATORY_CARE_PROVIDER_SITE_OTHER): Payer: BC Managed Care – PPO | Admitting: General Practice

## 2017-08-06 ENCOUNTER — Other Ambulatory Visit: Payer: BC Managed Care – PPO

## 2017-08-06 DIAGNOSIS — Z5181 Encounter for therapeutic drug level monitoring: Secondary | ICD-10-CM

## 2017-08-06 DIAGNOSIS — Z7901 Long term (current) use of anticoagulants: Secondary | ICD-10-CM | POA: Diagnosis not present

## 2017-08-06 DIAGNOSIS — D6869 Other thrombophilia: Secondary | ICD-10-CM

## 2017-08-06 DIAGNOSIS — Z86718 Personal history of other venous thrombosis and embolism: Secondary | ICD-10-CM

## 2017-08-06 LAB — POCT INR: INR: 4.1

## 2017-08-06 NOTE — Patient Instructions (Signed)
Pre visit review using our clinic review tool, if applicable. No additional management support is needed unless otherwise documented below in the visit note.  Hold dosage today and then resume taking 10 daily except 5 mg on Monday/Wed/Fridays.  Re-check in 4 weeks.

## 2017-08-25 ENCOUNTER — Other Ambulatory Visit: Payer: Self-pay | Admitting: Primary Care

## 2017-08-25 DIAGNOSIS — Z86718 Personal history of other venous thrombosis and embolism: Secondary | ICD-10-CM

## 2017-08-25 NOTE — Telephone Encounter (Signed)
Following with coumadin clinic. Refill sent to pharmacy. Patient overdue for follow up visit with me, please schedule CPE.

## 2017-08-25 NOTE — Telephone Encounter (Signed)
Ok to refill? Electronically refill request for warfarin (COUMADIN) 5 MG tablet  Last prescribed on 05/28/2017. Last seen for INR on 08/06/2017.

## 2017-08-27 NOTE — Telephone Encounter (Signed)
Message left for patient to return my call.  

## 2017-09-02 NOTE — Telephone Encounter (Signed)
Send patient a reminder letter through [facility] and in the mail.

## 2017-09-03 ENCOUNTER — Other Ambulatory Visit (INDEPENDENT_AMBULATORY_CARE_PROVIDER_SITE_OTHER): Payer: BC Managed Care – PPO

## 2017-09-03 ENCOUNTER — Ambulatory Visit (INDEPENDENT_AMBULATORY_CARE_PROVIDER_SITE_OTHER): Payer: BC Managed Care – PPO | Admitting: General Practice

## 2017-09-03 DIAGNOSIS — D6869 Other thrombophilia: Secondary | ICD-10-CM | POA: Diagnosis not present

## 2017-09-03 DIAGNOSIS — Z7901 Long term (current) use of anticoagulants: Secondary | ICD-10-CM

## 2017-09-03 DIAGNOSIS — Z86718 Personal history of other venous thrombosis and embolism: Secondary | ICD-10-CM

## 2017-09-03 LAB — PROTIME-INR
INR: 2 ratio — ABNORMAL HIGH (ref 0.8–1.0)
Prothrombin Time: 21.5 s — ABNORMAL HIGH (ref 9.6–13.1)

## 2017-09-03 NOTE — Patient Instructions (Addendum)
Pre visit review using our clinic review tool, if applicable. No additional management support is needed unless otherwise documented below in the visit note.  Take 15 mg today (4/11) and take 7.5 mg tomorrow (4/12) and then change dosage and take 5 mg (1 tablet) daily except 10 mg (2 tablets) on Monday/Wednesday/Friday.  Re-check in 2 weeks in the lab.  Appointment has been made for 7:30

## 2017-09-03 NOTE — Progress Notes (Signed)
Noted.  Patient has been called and dosed.  Pt verbalized understanding and paper work is being mailed.

## 2017-09-12 ENCOUNTER — Other Ambulatory Visit: Payer: Self-pay | Admitting: Primary Care

## 2017-09-17 ENCOUNTER — Other Ambulatory Visit: Payer: BC Managed Care – PPO

## 2017-09-17 ENCOUNTER — Ambulatory Visit: Payer: Self-pay

## 2017-09-17 DIAGNOSIS — Z7901 Long term (current) use of anticoagulants: Secondary | ICD-10-CM

## 2017-09-17 DIAGNOSIS — D6869 Other thrombophilia: Secondary | ICD-10-CM

## 2017-09-17 LAB — POCT INR: INR: 3.1

## 2017-09-21 NOTE — Patient Instructions (Signed)
INR 4/25: 3.1  Continue to take 5 mg (1 tablet) daily except 10 mg (2 tablets) on Monday/Wednesday/Friday.  Re-check in 4 weeks.   Patient is doing well without any changes to diet, health or medications.

## 2017-09-24 ENCOUNTER — Other Ambulatory Visit: Payer: Self-pay | Admitting: Primary Care

## 2017-09-24 DIAGNOSIS — D6869 Other thrombophilia: Secondary | ICD-10-CM

## 2017-09-24 DIAGNOSIS — Z86718 Personal history of other venous thrombosis and embolism: Secondary | ICD-10-CM

## 2017-09-24 NOTE — Telephone Encounter (Signed)
Refill sent to pharmacy.   

## 2017-09-24 NOTE — Telephone Encounter (Signed)
Ok to refill? Electronically refill request for warfarin (COUMADIN) 5 MG tablet  Last prescribed on 06/25/2017. Last seen with Allie Bossier on 04/25/2016. CPE is schedule on 10/12/2017

## 2017-09-28 ENCOUNTER — Other Ambulatory Visit: Payer: Self-pay | Admitting: Internal Medicine

## 2017-09-28 DIAGNOSIS — G500 Trigeminal neuralgia: Secondary | ICD-10-CM

## 2017-09-28 NOTE — Telephone Encounter (Signed)
Refill is not appropriate as this was from an acute visit. Will need re-evaluation is she's requesting refills. Will remove medication from chart.

## 2017-09-28 NOTE — Telephone Encounter (Signed)
Requesting refill for [facility]aclofen to [facility] Last refilled # 30 on 04/09/17 Last seen acute 04/09/17 Last saw PCP 04/25/16

## 2017-09-29 NOTE — Telephone Encounter (Signed)
Noted  

## 2017-09-29 NOTE — Telephone Encounter (Signed)
Patient notified as instructed by telephone and verbalized understanding.   Patient stated that she had another flare up and this was the second one in a week. Appointment scheduled with Allie Bossier NP Thursday 10/01/17 which was the earliest that patient can come in because of work.

## 2017-10-01 ENCOUNTER — Ambulatory Visit: Payer: BC Managed Care – PPO | Admitting: Primary Care

## 2017-10-01 ENCOUNTER — Encounter: Payer: Self-pay | Admitting: Primary Care

## 2017-10-01 VITALS — BP 124/74 | HR 49 | Temp 98.2°F | Ht 65.0 in | Wt 219.2 lb

## 2017-10-01 DIAGNOSIS — R479 Unspecified speech disturbances: Secondary | ICD-10-CM | POA: Diagnosis not present

## 2017-10-01 DIAGNOSIS — R7303 Prediabetes: Secondary | ICD-10-CM | POA: Diagnosis not present

## 2017-10-01 DIAGNOSIS — M792 Neuralgia and neuritis, unspecified: Secondary | ICD-10-CM | POA: Diagnosis not present

## 2017-10-01 DIAGNOSIS — R471 Dysarthria and anarthria: Secondary | ICD-10-CM | POA: Diagnosis not present

## 2017-10-01 DIAGNOSIS — R519 Headache, unspecified: Secondary | ICD-10-CM

## 2017-10-01 DIAGNOSIS — R51 Headache: Secondary | ICD-10-CM | POA: Diagnosis not present

## 2017-10-01 LAB — COMPREHENSIVE METABOLIC PANEL
ALT: 15 U/L (ref 0–35)
AST: 14 U/L (ref 0–37)
Albumin: 3.7 g/dL (ref 3.5–5.2)
Alkaline Phosphatase: 57 U/L (ref 39–117)
BILIRUBIN TOTAL: 0.5 mg/dL (ref 0.2–1.2)
BUN: 11 mg/dL (ref 6–23)
CHLORIDE: 107 meq/L (ref 96–112)
CO2: 27 meq/L (ref 19–32)
Calcium: 9.2 mg/dL (ref 8.4–10.5)
Creatinine, Ser: 0.94 mg/dL (ref 0.40–1.20)
GFR: 78.67 mL/min (ref >60.00)
GLUCOSE: 91 mg/dL (ref 70–99)
Potassium: 4 mEq/L (ref 3.5–5.1)
Sodium: 141 mEq/L (ref 135–145)
Total Protein: 7 g/dL (ref 6.0–8.3)

## 2017-10-01 LAB — CBC
HEMATOCRIT: 37.9 % (ref 36.0–46.0)
HEMOGLOBIN: 12.4 g/dL (ref 12.0–15.0)
MCHC: 32.6 g/dL (ref 30.0–36.0)
MCV: 79.2 fl (ref 78.0–100.0)
Platelets: 303 10*3/uL (ref 150.0–400.0)
RBC: 4.79 Mil/uL (ref 3.87–5.11)
RDW: 15.3 % (ref 11.5–15.5)
WBC: 3.6 10*3/uL — ABNORMAL LOW (ref 4.0–10.5)

## 2017-10-01 LAB — LIPID PANEL
CHOL/HDL RATIO: 3
Cholesterol: 169 mg/dL (ref 0–200)
HDL: 51.5 mg/dL (ref >39.00)
LDL Cholesterol: 95 mg/dL (ref 0–99)
NONHDL: 117.57
Triglycerides: 111 mg/dL (ref 0.0–149.0)
VLDL: 22.2 mg/dL (ref 0.0–40.0)

## 2017-10-01 LAB — SEDIMENTATION RATE: Sed Rate: 26 mm/hr (ref 0–30)

## 2017-10-01 LAB — HEMOGLOBIN A1C: Hgb A1c MFr Bld: 5.9 % (ref 4.6–6.5)

## 2017-10-01 MED ORDER — BACLOFEN 10 MG PO TABS: 5.0000 mg | ORAL_TABLET | Freq: Three times a day (TID) | ORAL | 0 refills | Status: DC

## 2017-10-01 MED ORDER — KETOROLAC TROMETHAMINE 30 MG/ML IJ SOLN
30.0000 mg | Freq: Once | INTRAMUSCULAR | Status: AC
  Administered 2017-10-01: 30 mg via INTRAMUSCULAR

## 2017-10-01 NOTE — Progress Notes (Signed)
Subjective:    Patient ID: Theresa Lewis, female    DOB: 07-13-1960, 57 y.o.   MRN: [id number]  HPI  Theresa Lewis is a 57 year old female who presents today for medication refill.  She was originally initiated on Baclofen in April 2017 for suspicion of Trigeminal Neuralgia. Her lab testing including ANA and ESR returned positive which raised increased suspicion for trigeminal neuralgia.     She'll experience pain to her left temporal region with radiation down to her left ear and jaw. Her symptoms will occur infrequently, about 8 times since her visit in April 2017.   Recently her episodes have become more frequent. Her last episode began three days ago, prior to that one week ago which is not normal. During prior episodes she'd been taking baclofen and ibuprofen with resolve, but no relief with the last two episodes. Her last episode was so terrible that she had to leave work, it brought her to tears.   She has noticed difficulty with articulating words since her recent episode began three days ago. She can think about what she wants to say but has a difficult time articulating. She denies unilateral weakness, facial drooping, numbness, changes in speech.  Review of Systems  Constitutional: Negative for fever.  Respiratory: Negative for shortness of breath.   Cardiovascular: Negative for chest pain.  Neurological: Positive for headaches. Negative for dizziness, weakness and numbness.       Left sided facial pain       Past Medical History:  Diagnosis Date  . ABNORMAL VAGINAL BLEEDING 04/19/2007   Qualifier: Diagnosis of  By: Hulan Saas, CMA (AAMA), Quita Skye   . Deep venous thrombosis ([place])    2006    3 x clotting   . HEMORRHOIDS, WITH BLEEDING 12/26/2008   Qualifier: Diagnosis of  By: Regis Bill MD, Standley Brooking   . PE (pulmonary embolism) 07/09/2010  . Pulmonary embolism [facility])      Social History   Socioeconomic History  . Marital status: Married    Spouse name: Not on file  .  Number of children: 1  . Years of education: Not on file  . Highest education level: Not on file  Occupational History  . Occupation: Scientist, water quality  Social Needs  . Financial resource strain: Not on file  . Food insecurity:    Worry: Not on file    Inability: Not on file  . Transportation needs:    Medical: Not on file    Non-medical: Not on file  Tobacco Use  . Smoking status: Never Smoker  . Smokeless tobacco: Never Used  Substance and Sexual Activity  . Alcohol use: Yes    Alcohol/week: 0.0 oz    Comment: rarely  . Drug use: No  . Sexual activity: Not on file  Lifestyle  . Physical activity:    Days per week: Not on file    Minutes per session: Not on file  . Stress: Not on file  Relationships  . Social connections:    Talks on phone: Not on file    Gets together: Not on file    Attends religious service: Not on file    Active member of club or organization: Not on file    Attends meetings of clubs or organizations: Not on file    Relationship status: Not on file  . Intimate partner violence:    Fear of current or ex partner: Not on file    Emotionally abused: Not on file  Physically abused: Not on file    Forced sexual activity: Not on file  Other Topics Concern  . Not on file  Social History Narrative   HH of 3  Daughter 10 and self.  hh of 2    No pets.   No ets.   No etoh.    Working for [employer] and [employer] .  45- 50 hours per week.                 Past Surgical History:  Procedure Laterality Date  . MYOMECTOMY    . TUBAL LIGATION    . vena caval filter      Family History  Problem Relation Age of Onset  . Lung cancer Father 73       april 12   . Pancreatic cancer Father 43  . Hypertension Mother   . Rheum arthritis Mother   . Colon cancer Neg Hx   . Colon polyps Neg Hx   . Kidney disease Neg Hx   . Diabetes Paternal Aunt        x2  . Esophageal cancer Neg Hx   . Gallbladder disease Neg Hx     Allergies  Allergen Reactions    . Clindamycin/Lincomycin     "unknown"  . Tape Other (See Comments)    When pt has a band-aid on, the impression of band-aid stays on for a month    Current Outpatient Medications on File Prior to Visit  Medication Sig Dispense Refill  . [facility] STOOL SOFTENER 100 MG capsule TAKE 1 CAPSULE BY MOUTH AT BEDTIME AS NEEDED 30 capsule 0  . omeprazole (PRILOSEC) 20 MG capsule TAKE 1 CAPSULE (20 MG TOTAL) BY MOUTH DAILY. 90 capsule 1  . warfarin (COUMADIN) 5 MG tablet TAKE AS DIRECTED BY ANTI-COAGULATION CLINIC. 90 tablet 0   No current facility-administered medications on file prior to visit.     BP 124/74   Pulse (!) 49   Temp 98.2 F (36.8 C) (Oral)   Ht _0  (1.651 m)   Wt 219 lb 4 oz (99.5 kg)   SpO2 98%   BMI 36.49 kg/m    Objective:   Physical Exam  Constitutional: She is oriented to person, place, and time. She appears well-nourished.  Cardiovascular: Normal rate and regular rhythm.  Pulmonary/Chest: Effort normal and breath sounds normal.  Neurological: She is alert and oriented to person, place, and time. No cranial nerve deficit. Coordination normal.  No facial drooping, arm drift, slurred speech. She does have difficulty with word articulation.  Tenderness to left temporal region, left lateral neck, left lateral submandibular region.  Skin: Skin is dry.          Assessment & Plan:

## 2017-10-01 NOTE — Patient Instructions (Signed)
Start baclofen tablets. Take 1/2 tablet three times daily for 2 weeks consistently.  Stop by the lab prior to leaving today. I will notify you of your results once received.   Stop by the front desk and speak with either Rosaria Ferries or Anastasiya regarding your referral to Neurology.  It was a pleasure to see you today!

## 2017-10-01 NOTE — Assessment & Plan Note (Signed)
Intermittent since April 2017, increased frequency of episodes over last 2 weeks. Exam today suspicious for Trigeminal Neuralgia, will need to rule out temporal arteritis.  Suspect difficulty with word articulation is secondary to decreased movement of jaw due to pain, but will obtain CT head to rule out CVA.   Labs pending including ESR, CBC, Lipid panel, A1C, CMP. Referral placed to neurology for further evaluation.

## 2017-10-05 ENCOUNTER — Encounter: Payer: Self-pay | Admitting: Primary Care

## 2017-10-05 LAB — ANA: Anti Nuclear Antibody(ANA): POSITIVE — AB

## 2017-10-05 LAB — ANTI-NUCLEAR AB-TITER (ANA TITER): ANA Titer 1: 1:640 {titer} — ABNORMAL HIGH

## 2017-10-07 ENCOUNTER — Ambulatory Visit: Payer: BC Managed Care – PPO

## 2017-10-12 ENCOUNTER — Encounter: Payer: Self-pay | Admitting: Primary Care

## 2017-10-12 ENCOUNTER — Other Ambulatory Visit: Payer: BC Managed Care – PPO

## 2017-10-12 ENCOUNTER — Ambulatory Visit: Payer: BC Managed Care – PPO

## 2017-10-12 ENCOUNTER — Ambulatory Visit (INDEPENDENT_AMBULATORY_CARE_PROVIDER_SITE_OTHER): Payer: BC Managed Care – PPO | Admitting: Primary Care

## 2017-10-12 ENCOUNTER — Other Ambulatory Visit (HOSPITAL_COMMUNITY)
Admission: RE | Admit: 2017-10-12 | Discharge: 2017-10-12 | Disposition: A | Discharge status: Home / Self Care | Payer: BC Managed Care – PPO | Source: Ambulatory Visit | Attending: Primary Care | Admitting: Primary Care

## 2017-10-12 ENCOUNTER — Other Ambulatory Visit: Payer: Self-pay | Admitting: Primary Care

## 2017-10-12 VITALS — BP 120/70 | HR 81 | Temp 97.8°F | Ht 65.0 in | Wt 219.5 lb

## 2017-10-12 DIAGNOSIS — Z1231 Encounter for screening mammogram for malignant neoplasm of breast: Secondary | ICD-10-CM

## 2017-10-12 DIAGNOSIS — G500 Trigeminal neuralgia: Secondary | ICD-10-CM

## 2017-10-12 DIAGNOSIS — D6861 Antiphospholipid syndrome: Secondary | ICD-10-CM | POA: Diagnosis not present

## 2017-10-12 DIAGNOSIS — Z7901 Long term (current) use of anticoagulants: Secondary | ICD-10-CM

## 2017-10-12 DIAGNOSIS — R7303 Prediabetes: Secondary | ICD-10-CM | POA: Diagnosis not present

## 2017-10-12 DIAGNOSIS — Z0000 Encounter for general adult medical examination without abnormal findings: Secondary | ICD-10-CM

## 2017-10-12 DIAGNOSIS — Z124 Encounter for screening for malignant neoplasm of cervix: Secondary | ICD-10-CM

## 2017-10-12 DIAGNOSIS — Z1159 Encounter for screening for other viral diseases: Secondary | ICD-10-CM | POA: Diagnosis not present

## 2017-10-12 LAB — PROTIME-INR
INR: 3.7 ratio — AB (ref 0.8–1.0)
Prothrombin Time: 42.6 s — ABNORMAL HIGH (ref 9.6–13.1)

## 2017-10-12 NOTE — Progress Notes (Signed)
Subjective:    Patient ID: Theresa Lewis, female    DOB: Jul 24, 1960, 57 y.o.   MRN: [id number]  HPI  [name] is a 57 year old female who presents today for complete physical.  Immunizations: -Tetanus: Completed in 2014 -Zoster: Completed in 2013   Diet: She endorses a fair diet.  Breakfast: Cereal, breakfast bar Lunch: Fast food (meat) Dinner: Meat, mac and cheese, rice Snacks: Chips Desserts: Chocolate. Daily Beverages: Some water, sweet tea, soda  Exercise: She is not exercising Eye exam: Completed in 2019 Dental exam: Completes every 2 years.  Colonoscopy: Completed in 2010 Pap Smear: Completed in 2016, due today Mammogram: Scheduled for June 2019 Hep C Screen: Due today.   Review of Systems  Constitutional: Negative for unexpected weight change.  HENT: Negative for rhinorrhea.   Respiratory: Negative for cough and shortness of breath.   Cardiovascular: Negative for chest pain.  Gastrointestinal: Negative for constipation and diarrhea.  Genitourinary: Negative for difficulty urinating and menstrual problem.  Musculoskeletal: Negative for arthralgias and myalgias.  Skin: Negative for rash.  Allergic/Immunologic: Negative for environmental allergies.  Neurological: Negative for dizziness and numbness.       Facial pain and headaches improving.  Psychiatric/Behavioral: The patient is not nervous/anxious.        Past Medical History:  Diagnosis Date  . ABNORMAL VAGINAL BLEEDING 04/19/2007   Qualifier: Diagnosis of  By: Hulan Saas, CMA (AAMA), [place]   . Deep venous thrombosis ([place])    2006    3 x clotting   . HEMORRHOIDS, WITH BLEEDING 12/26/2008   Qualifier: Diagnosis of  By: Regis Bill MD, Standley Brooking   . PE (pulmonary embolism) 07/09/2010  . Pulmonary embolism [facility])      Social History   Socioeconomic History  . Marital status: Married    Spouse name: Not on file  . Number of children: 1  . Years of education: Not on file  . Highest education level:  Not on file  Occupational History  . Occupation: Scientist, water quality  Social Needs  . Financial resource strain: Not on file  . Food insecurity:    Worry: Not on file    Inability: Not on file  . Transportation needs:    Medical: Not on file    Non-medical: Not on file  Tobacco Use  . Smoking status: Never Smoker  . Smokeless tobacco: Never Used  Substance and Sexual Activity  . Alcohol use: Yes    Alcohol/week: 0.0 oz    Comment: rarely  . Drug use: No  . Sexual activity: Not on file  Lifestyle  . Physical activity:    Days per week: Not on file    Minutes per session: Not on file  . Stress: Not on file  Relationships  . Social connections:    Talks on phone: Not on file    Gets together: Not on file    Attends religious service: Not on file    Active member of club or organization: Not on file    Attends meetings of clubs or organizations: Not on file    Relationship status: Not on file  . Intimate partner violence:    Fear of current or ex partner: Not on file    Emotionally abused: Not on file    Physically abused: Not on file    Forced sexual activity: Not on file  Other Topics Concern  . Not on file  Social History Narrative   HH of 3  Daughter 32  and self.  hh of 2    No pets.   No ets.   No etoh.    Working for [employer] and [employer] .  45- 50 hours per week.                 Past Surgical History:  Procedure Laterality Date  . MYOMECTOMY    . TUBAL LIGATION    . vena caval filter      Family History  Problem Relation Age of Onset  . Lung cancer Father 73       april 12   . Pancreatic cancer Father 65  . Hypertension Mother   . Rheum arthritis Mother   . Colon cancer Neg Hx   . Colon polyps Neg Hx   . Kidney disease Neg Hx   . Diabetes Paternal Aunt        x2  . Esophageal cancer Neg Hx   . Gallbladder disease Neg Hx     Allergies  Allergen Reactions  . Clindamycin/Lincomycin     "unknown"  . Tape Other (See Comments)    When pt has a  band-aid on, the impression of band-aid stays on for a month    Current Outpatient Medications on File Prior to Visit  Medication Sig Dispense Refill  . baclofen (LIORESAL) 10 MG tablet Take 0.5 tablets (5 mg total) by mouth 3 (three) times daily. 84 each 0  . [facility] STOOL SOFTENER 100 MG capsule TAKE 1 CAPSULE BY MOUTH AT BEDTIME AS NEEDED 30 capsule 0  . omeprazole (PRILOSEC) 20 MG capsule TAKE 1 CAPSULE (20 MG TOTAL) BY MOUTH DAILY. 90 capsule 1  . warfarin (COUMADIN) 5 MG tablet TAKE AS DIRECTED BY ANTI-COAGULATION CLINIC. 90 tablet 0  . gabapentin (NEURONTIN) 100 MG capsule Taking 100 mg three times per day for one week, then increase to 200 mg three times per day for nerve pain.     No current facility-administered medications on file prior to visit.     BP 120/70   Pulse 81   Temp 97.8 F (36.6 C) (Oral)   Ht _0  (1.651 m)   Wt 219 lb 8 oz (99.6 kg)   SpO2 99%   BMI 36.53 kg/m    Objective:   Physical Exam  Constitutional: She is oriented to person, place, and time. She appears well-nourished.  HENT:  Right Ear: Tympanic membrane and ear canal normal.  Left Ear: Tympanic membrane and ear canal normal.  Nose: Nose normal.  Mouth/Throat: Oropharynx is clear and moist.  Eyes: Pupils are equal, round, and reactive to light. Conjunctivae and EOM are normal.  Neck: Neck supple. No thyromegaly present.  Cardiovascular: Normal rate and regular rhythm.  No murmur heard. Pulmonary/Chest: Effort normal and breath sounds normal. She has no rales.  Abdominal: Soft. Bowel sounds are normal. There is no tenderness.  Genitourinary: There is no tenderness or lesion on the right labia. There is no tenderness or lesion on the left labia. Cervix exhibits no motion tenderness and no discharge. No erythema in the vagina. Vaginal discharge found.  Genitourinary Comments: Small amount of whitish discharge  Musculoskeletal: Normal range of motion.  Lymphadenopathy:    She has no cervical  adenopathy.  Neurological: She is alert and oriented to person, place, and time. She has normal reflexes. No cranial nerve deficit.  Skin: Skin is warm and dry. No rash noted.  Psychiatric: She has a normal mood and affect.  Assessment & Plan:

## 2017-10-12 NOTE — Assessment & Plan Note (Signed)
Immunizations UTD. Pap smear due, pending. Mammogram UTD. Colonoscopy UTD, due in 2020. Discussed the importance of a healthy diet and regular exercise in order for weight loss, and to reduce the risk of any potential medical problems. Exam unremarkable. Labs pending. Follow up in 1 year.

## 2017-10-12 NOTE — Assessment & Plan Note (Signed)
Recent A1C in the prediabetic range but overall stable. Recommended regular exercise, improvement in diet. Diet is challenged due to coumadin. Repeat A1C in 1 year.

## 2017-10-12 NOTE — Assessment & Plan Note (Signed)
Following now with neurology who prescribed gabapentin. She will undergo MRI if symptoms persist despite new treatment. Hold baclofen for now.

## 2017-10-12 NOTE — Patient Instructions (Addendum)
Stop by the lab prior to leaving today. I will notify you of your results once received.   Start exercising. You should be getting 150 minutes of moderate intensity exercise weekly.  Limit sweet tea and soda due to prediabetes. Take a look at the information below.  We will be in touch once we receive your pap smear results.  Complete your mammogram as scheduled.   Follow up with neurology as recommended.  Follow up in 1 year for your annual exam or sooner if needed.  It was a pleasure to see you today!  Prediabetes Eating Plan Prediabetes-also called impaired glucose tolerance or impaired fasting glucose-is a condition that causes blood sugar (blood glucose) levels to be higher than normal. Following a healthy diet can help to keep prediabetes under control. It can also help to lower the risk of type 2 diabetes and heart disease, which are increased in people who have prediabetes. Along with regular exercise, a healthy diet:  Promotes weight loss.  Helps to control blood sugar levels.  Helps to improve the way that the body uses insulin.  What do I need to know about this eating plan?  Use the glycemic index (GI) to plan your meals. The index tells you how quickly a food will raise your blood sugar. Choose low-GI foods. These foods take a longer time to raise blood sugar.  Pay close attention to the amount of carbohydrates in the food that you eat. Carbohydrates increase blood sugar levels.  Keep track of how many calories you take in. Eating the right amount of calories will help you to achieve a healthy weight. Losing about 7 percent of your starting weight can help to prevent type 2 diabetes.  You may want to follow a Mediterranean diet. This diet includes a lot of vegetables, lean meats or fish, whole grains, fruits, and healthy oils and fats. What foods can I eat? Grains Whole grains, such as whole-wheat or whole-grain breads, crackers, cereals, and pasta. Unsweetened  oatmeal. Bulgur. Barley. Quinoa. Brown rice. Corn or whole-wheat flour tortillas or taco shells. Vegetables Lettuce. Spinach. Peas. Beets. Cauliflower. Cabbage. Broccoli. Carrots. Tomatoes. Squash. Eggplant. Herbs. Peppers. Onions. Cucumbers. Brussels sprouts. Fruits Berries. Bananas. Apples. Oranges. Grapes. Papaya. Mango. Pomegranate. Kiwi. Grapefruit. Cherries. Meats and Other Protein Sources Seafood. Lean meats, such as chicken and [place] or lean cuts of pork and beef. Tofu. Eggs. Nuts. Beans. Dairy Low-fat or fat-free dairy products, such as yogurt, cottage cheese, and cheese. Beverages Water. Tea. Coffee. Sugar-free or diet soda. Seltzer water. Milk. Milk alternatives, such as soy or almond milk. Condiments Mustard. Relish. Low-fat, low-sugar ketchup. Low-fat, low-sugar barbecue sauce. Low-fat or fat-free mayonnaise. Sweets and Desserts Sugar-free or low-fat pudding. Sugar-free or low-fat ice cream and other frozen treats. Fats and Oils Avocado. Walnuts. Olive oil. The items listed above may not be a complete list of recommended foods or beverages. Contact your dietitian for more options. What foods are not recommended? Grains Refined white flour and flour products, such as bread, pasta, snack foods, and cereals. Beverages Sweetened drinks, such as sweet iced tea and soda. Sweets and Desserts Baked goods, such as cake, cupcakes, pastries, cookies, and cheesecake. The items listed above may not be a complete list of foods and beverages to avoid. Contact your dietitian for more information. This information is not intended to replace advice given to you by your health care provider. Make sure you discuss any questions you have with your health care provider. Document Released: 09/26/2014 Document Revised: 10/18/2015  Document Reviewed: 06/07/2014 Elsevier Interactive Patient Education  2017 [employer].

## 2017-10-12 NOTE — Assessment & Plan Note (Signed)
Managed on warfarin, following through our clinic. INR pending. Based off of Up to Date, warfarin is best practice for treatment of antiphospholipid antibody syndrome. Will continue same.

## 2017-10-13 ENCOUNTER — Other Ambulatory Visit: Payer: Self-pay | Admitting: Acute Care

## 2017-10-13 ENCOUNTER — Ambulatory Visit: Payer: Self-pay

## 2017-10-13 DIAGNOSIS — I82499 Acute embolism and thrombosis of other specified deep vein of unspecified lower extremity: Secondary | ICD-10-CM

## 2017-10-13 DIAGNOSIS — G500 Trigeminal neuralgia: Secondary | ICD-10-CM

## 2017-10-13 DIAGNOSIS — Z7901 Long term (current) use of anticoagulants: Secondary | ICD-10-CM

## 2017-10-13 DIAGNOSIS — I2699 Other pulmonary embolism without acute cor pulmonale: Secondary | ICD-10-CM

## 2017-10-13 LAB — CYTOLOGY - PAP
Diagnosis: NEGATIVE
HPV (WINDOPATH): NOT DETECTED

## 2017-10-13 LAB — HEPATITIS C ANTIBODY
Hepatitis C Ab: NONREACTIVE
SIGNAL TO CUT-OFF: 0.02 (ref <1.00)

## 2017-10-13 LAB — POCT INR: INR: 3.7 — AB (ref 2.0–3.0)

## 2017-10-13 NOTE — Progress Notes (Addendum)
Note entered in error

## 2017-10-13 NOTE — Patient Instructions (Addendum)
INR today 3.7  Patient is to hold her dose tomorrow (5/22) as has already taken today, and then resume prior dose taking 5mg  daily EXCEPT for 10mg  on Mon, Wed, Fri, Recheck in 3-4 weeks.   Patient is doing well without any changes to diet, health or medications.  Notified patient via phone, she verbalizes understanding of all instructions given today.

## 2017-10-30 ENCOUNTER — Other Ambulatory Visit: Payer: Self-pay | Admitting: Primary Care

## 2017-10-30 ENCOUNTER — Ambulatory Visit: Payer: BC Managed Care – PPO

## 2017-10-30 ENCOUNTER — Ambulatory Visit
Admission: RE | Admit: 2017-10-30 | Discharge: 2017-10-30 | Disposition: A | Discharge status: Home / Self Care | Payer: BC Managed Care – PPO | Source: Ambulatory Visit | Attending: Primary Care | Admitting: Primary Care

## 2017-10-30 DIAGNOSIS — Z1231 Encounter for screening mammogram for malignant neoplasm of breast: Secondary | ICD-10-CM | POA: Insufficient documentation

## 2017-11-03 ENCOUNTER — Other Ambulatory Visit (INDEPENDENT_AMBULATORY_CARE_PROVIDER_SITE_OTHER): Payer: BC Managed Care – PPO

## 2017-11-03 ENCOUNTER — Ambulatory Visit: Payer: Self-pay

## 2017-11-03 DIAGNOSIS — I2699 Other pulmonary embolism without acute cor pulmonale: Secondary | ICD-10-CM | POA: Diagnosis not present

## 2017-11-03 DIAGNOSIS — Z7901 Long term (current) use of anticoagulants: Secondary | ICD-10-CM

## 2017-11-03 DIAGNOSIS — I82499 Acute embolism and thrombosis of other specified deep vein of unspecified lower extremity: Secondary | ICD-10-CM

## 2017-11-03 LAB — PROTIME-INR
INR: 1.7 ratio — ABNORMAL HIGH (ref 0.8–1.0)
Prothrombin Time: 20 s — ABNORMAL HIGH (ref 9.6–13.1)

## 2017-11-03 LAB — POCT INR: INR: 1.7 — AB (ref 2.0–3.0)

## 2017-11-03 NOTE — Patient Instructions (Signed)
INR today 1.7  Patient is to take 10mg  today (6/11) and 15mg  tomorrow 6/12 and then resume prior dose taking 5mg  daily EXCEPT for 10mg  on Mon, Wed, Fri, Recheck in 3-4 weeks.  Spoke with patient.  She missed taking her coumadin yesterday which would account for subtherapeutic reading, otherwise she likely would have been in range.  Patient verbalizes understanding of all instructions given today.  She understands risks associated with a subtherapeutic reading and will go to Er if any concerns develop.

## 2017-11-16 ENCOUNTER — Other Ambulatory Visit: Payer: Self-pay | Admitting: Primary Care

## 2017-11-16 DIAGNOSIS — Z86718 Personal history of other venous thrombosis and embolism: Secondary | ICD-10-CM

## 2017-11-17 NOTE — Telephone Encounter (Signed)
Ok to refill? Electronically refill request for warfarin (COUMADIN) 5 MG tablet  Last prescribed on 08/25/2017  Last seen on 11/03/2017 (INR)

## 2017-11-17 NOTE — Telephone Encounter (Signed)
Refill sent to pharmacy.   

## 2017-11-24 ENCOUNTER — Other Ambulatory Visit: Payer: Self-pay | Admitting: Primary Care

## 2017-11-24 DIAGNOSIS — M792 Neuralgia and neuritis, unspecified: Secondary | ICD-10-CM

## 2017-11-24 NOTE — Telephone Encounter (Signed)
Last filled 10/01/17 at [facility] for Dx of Trigeminal neuralgia... Please advise

## 2017-11-30 ENCOUNTER — Ambulatory Visit: Payer: Self-pay

## 2017-11-30 ENCOUNTER — Other Ambulatory Visit (INDEPENDENT_AMBULATORY_CARE_PROVIDER_SITE_OTHER): Payer: BC Managed Care – PPO

## 2017-11-30 ENCOUNTER — Ambulatory Visit: Payer: BC Managed Care – PPO

## 2017-11-30 DIAGNOSIS — Z5181 Encounter for therapeutic drug level monitoring: Secondary | ICD-10-CM

## 2017-11-30 DIAGNOSIS — D6869 Other thrombophilia: Secondary | ICD-10-CM

## 2017-11-30 DIAGNOSIS — Z7901 Long term (current) use of anticoagulants: Secondary | ICD-10-CM

## 2017-11-30 DIAGNOSIS — Z86718 Personal history of other venous thrombosis and embolism: Secondary | ICD-10-CM

## 2017-11-30 LAB — POCT INR: INR: 1.3 — AB (ref 2.0–3.0)

## 2017-11-30 NOTE — Patient Instructions (Signed)
INR today 1.7  Patient is to take 15mg  today (7/8) and 10mg  tomorrow 7/9 and then increase weekly dose to taking 10mg  daily EXCEPT for 5mg  on Sun, Tues, Thurs recheck in 2 weeks.   I LM on patients answering machine (cell VM) okay per DPR with detailed instructions regarding dosing.  Patient is to call me back and confirm understanding and agreement with recheck appt on 7/22 at 730 in the lab.

## 2017-12-11 ENCOUNTER — Ambulatory Visit: Payer: Self-pay

## 2017-12-11 ENCOUNTER — Other Ambulatory Visit (INDEPENDENT_AMBULATORY_CARE_PROVIDER_SITE_OTHER): Payer: BC Managed Care – PPO

## 2017-12-11 DIAGNOSIS — Z86718 Personal history of other venous thrombosis and embolism: Secondary | ICD-10-CM | POA: Diagnosis not present

## 2017-12-11 DIAGNOSIS — Z7901 Long term (current) use of anticoagulants: Secondary | ICD-10-CM

## 2017-12-11 DIAGNOSIS — D6869 Other thrombophilia: Secondary | ICD-10-CM | POA: Diagnosis not present

## 2017-12-11 DIAGNOSIS — Z5181 Encounter for therapeutic drug level monitoring: Secondary | ICD-10-CM | POA: Diagnosis not present

## 2017-12-11 LAB — POCT INR: INR: 3.5 — AB (ref 2.0–3.0)

## 2017-12-11 NOTE — Patient Instructions (Addendum)
INR today 3.5  Patient is to continue taking 10mg  daily EXCEPT for 5mg  on Sun, Tue, and Thursday and recheck in 2 weeks due to frequent fluctuations in readings right now.    Patient verbalizes understanding of all instructions given today.

## 2017-12-24 ENCOUNTER — Ambulatory Visit: Payer: BC Managed Care – PPO

## 2017-12-24 ENCOUNTER — Ambulatory Visit: Payer: BC Managed Care – PPO | Admitting: General Practice

## 2017-12-24 ENCOUNTER — Telehealth: Payer: Self-pay

## 2017-12-24 DIAGNOSIS — Z7901 Long term (current) use of anticoagulants: Secondary | ICD-10-CM | POA: Diagnosis not present

## 2017-12-24 LAB — POCT INR: INR: 3.5 — AB (ref 2.0–3.0)

## 2017-12-24 NOTE — Patient Instructions (Addendum)
Pre visit review using our clinic review tool, if applicable. No additional management support is needed unless otherwise documented below in the visit note.  Patient is to continue taking 10mg  daily EXCEPT for 5mg  on Sun, Tue, and Thursday and recheck in 3 weeks due to frequent fluctuations in readings right now.

## 2017-12-24 NOTE — Telephone Encounter (Signed)
Patient was able to make coumadin appointment today and does not need anything further.

## 2017-12-24 NOTE — Telephone Encounter (Signed)
Copied from [place] [id number]. Topic: General - Other >> Dec 23, 2017  1:24 PM Synthia Innocent wrote: Reason for CRM: Requesting lab INR drawn at lab, unable to come for coumadin appt. Please advise

## 2018-01-14 ENCOUNTER — Ambulatory Visit: Payer: BC Managed Care – PPO | Admitting: General Practice

## 2018-01-14 DIAGNOSIS — Z7901 Long term (current) use of anticoagulants: Secondary | ICD-10-CM

## 2018-01-14 LAB — POCT INR: INR: 3.7 — AB (ref 2.0–3.0)

## 2018-01-14 NOTE — Patient Instructions (Incomplete)
Pre visit review using our clinic review tool, if applicable. No additional management support is needed unless otherwise documented below in the visit note.  Hold coumadin today and then take 10mg  daily EXCEPT for 5mg  on Sun, Tue, and Thursday and 7.5 mg on Saturdays.  Recheck in 4 weeks.

## 2018-02-02 ENCOUNTER — Other Ambulatory Visit: Payer: Self-pay | Admitting: Primary Care

## 2018-02-02 DIAGNOSIS — D6869 Other thrombophilia: Secondary | ICD-10-CM

## 2018-02-02 DIAGNOSIS — Z86718 Personal history of other venous thrombosis and embolism: Secondary | ICD-10-CM

## 2018-02-04 NOTE — Telephone Encounter (Signed)
Patient is compliant with coumadin management, will refill X 6 months.   

## 2018-02-11 ENCOUNTER — Ambulatory Visit: Payer: BC Managed Care – PPO | Admitting: General Practice

## 2018-02-11 DIAGNOSIS — Z7901 Long term (current) use of anticoagulants: Secondary | ICD-10-CM | POA: Diagnosis not present

## 2018-02-11 LAB — POCT INR: INR: 2.7 (ref 2.0–3.0)

## 2018-02-11 NOTE — Patient Instructions (Addendum)
Pre visit review using our clinic review tool, if applicable. No additional management support is needed unless otherwise documented below in the visit note.  Continue to take 10mg  daily EXCEPT for 5mg  on Sun, Tue, and Thursday and 7.5 mg on Saturdays.  Recheck in 4 weeks.

## 2018-03-11 ENCOUNTER — Ambulatory Visit: Payer: BC Managed Care – PPO | Admitting: General Practice

## 2018-03-11 DIAGNOSIS — Z7901 Long term (current) use of anticoagulants: Secondary | ICD-10-CM | POA: Diagnosis not present

## 2018-03-11 DIAGNOSIS — Z23 Encounter for immunization: Secondary | ICD-10-CM

## 2018-03-11 LAB — POCT INR: INR: 2.6 (ref 2.0–3.0)

## 2018-03-11 NOTE — Patient Instructions (Signed)
Pre visit review using our clinic review tool, if applicable. No additional management support is needed unless otherwise documented below in the visit note.  Continue to take 10mg  daily EXCEPT for 5mg  on Sun, Tue, and Thursday and 7.5 mg on Saturdays.  Recheck in 4 weeks.

## 2018-04-08 ENCOUNTER — Ambulatory Visit: Payer: BC Managed Care – PPO | Admitting: Family Medicine

## 2018-04-08 ENCOUNTER — Encounter: Payer: Self-pay | Admitting: Family Medicine

## 2018-04-08 ENCOUNTER — Ambulatory Visit (INDEPENDENT_AMBULATORY_CARE_PROVIDER_SITE_OTHER): Payer: BC Managed Care – PPO | Admitting: General Practice

## 2018-04-08 VITALS — BP 126/88 | HR 66 | Temp 98.4°F | Resp 10 | Ht 65.0 in | Wt 222.1 lb

## 2018-04-08 DIAGNOSIS — K219 Gastro-esophageal reflux disease without esophagitis: Secondary | ICD-10-CM | POA: Diagnosis not present

## 2018-04-08 DIAGNOSIS — K648 Other hemorrhoids: Secondary | ICD-10-CM | POA: Diagnosis not present

## 2018-04-08 DIAGNOSIS — L239 Allergic contact dermatitis, unspecified cause: Secondary | ICD-10-CM

## 2018-04-08 DIAGNOSIS — Z7901 Long term (current) use of anticoagulants: Secondary | ICD-10-CM

## 2018-04-08 DIAGNOSIS — D6869 Other thrombophilia: Secondary | ICD-10-CM | POA: Diagnosis not present

## 2018-04-08 LAB — POCT INR: INR: 2 (ref 2.0–3.0)

## 2018-04-08 MED ORDER — HYDROCORTISONE 2.5 % RE CREA: TOPICAL_CREAM | Freq: Three times a day (TID) | RECTAL | Status: DC

## 2018-04-08 NOTE — Assessment & Plan Note (Signed)
Already on omeprazole but taking before eating. Instructed to take after eating. Return for re-assessment

## 2018-04-08 NOTE — Patient Instructions (Signed)
Take Omeprazole AFTER eating something containing protein, 30-60 minutes  Rash: Continue the anti-itch cream, elevate to help with swelling, if worsening -- return to clinic    Hemorrhoids Hemorrhoids are swollen veins in and around the rectum or anus. Hemorrhoids can cause pain, itching, or bleeding. Most of the time, they do not cause serious problems. They usually get better with diet changes, lifestyle changes, and other home treatments. Follow these instructions at home: Eating and drinking  Eat foods that have fiber, such as whole grains, beans, nuts, fruits, and vegetables. Ask your doctor about taking products that have added fiber (fibersupplements).  Drink enough fluid to keep your pee (urine) clear or pale yellow. For Pain and Swelling  Take a warm-water bath (sitz bath) for 20 minutes to ease pain. Do this 3-4 times a day.  If directed, put ice on the painful area. It may be helpful to use ice between your warm baths. ? Put ice in a plastic bag. ? Place a towel between your skin and the bag. ? Leave the ice on for 20 minutes, 2-3 times a day. General instructions  Take over-the-counter and prescription medicines only as told by your doctor. ? Medicated creams and medicines that are inserted into the anus (suppositories) may be used or applied as told.  Exercise often.  Go to the bathroom when you have the urge to poop (to have a bowel movement). Do not wait.  Avoid pushing too hard (straining) when you poop.  Keep the butt area dry and clean. Use wet toilet paper or moist paper towels.  Do not sit on the toilet for a long time. Contact a doctor if:  You have any of these: ? Pain and swelling that do not get better with treatment or medicine. ? Bleeding that will not stop. ? Trouble pooping or you cannot poop. ? Pain or swelling outside the area of the hemorrhoids. This information is not intended to replace advice given to you by your health care provider. Make  sure you discuss any questions you have with your health care provider. Document Released: 02/19/2008 Document Revised: 10/18/2015 Document Reviewed: 01/24/2015 Elsevier Interactive Patient Education  [employer].

## 2018-04-08 NOTE — Patient Instructions (Addendum)
Pre visit review using our clinic review tool, if applicable. No additional management support is needed unless otherwise documented below in the visit note.  Take 7.5 mg today (11/14) and 12.5 mg tomorrow (11/15) and then continue to take 10mg  daily EXCEPT for 5mg  on Sun, Tue, and Thursday and 7.5 mg on Saturdays.  Recheck in 4 weeks.

## 2018-04-08 NOTE — Progress Notes (Signed)
Subjective:    Patient ID: Theresa Lewis, female    DOB: [birth year], 57 y.o.   MRN: [id number]  Chief Complaint  Patient presents with  . Rectal Bleeding    x 2 weeks. Sees blood on the toilet paper and toilet water after a bowel movement. Sometimes a lot of blood. Burning pain present.  . Insect Bite    on right foot. possible spider bite?  . Gastroesophageal Reflux    Omeprazole not working. No other medications have been tried other than Mylanta.      HPI  #Rectal Bleeding - endorses constant pain - hx of hemorrhoids when she was younger w/ pregnancy - has had it off and on every few years - steady for 2 weeks - using preparation H at home w/ some improvement with pain - pain worse with BM - on on the toilet paper and in the bowl, seems to be red water - no dark or tarry stool - denies constipation, regular BM - is patient, avoids pushing - INR was 2.0 today, has been on this for years - last CBC - hgb 12.4 - denies palpitations - endorses a few lightheaded moments - "miss steps" which go away quickly - already doing warm baths and wet wipes  #rash/insect bite - itchy and swollen ankle - rash present on the ankle - using - anti-itch cortisone with some improvement in rash/swelling/itching - missed work on Tuesday and it is improving  # Reflux - taking ompreprazole every morning 1 hour before breakfast - does not think it is helping    Review of Systems See above Denies fever/chills Denies CP      Objective:   Physical Exam  Constitutional: She appears well-developed and well-nourished. No distress.  HENT:  Head: Normocephalic and atraumatic.  Eyes: Conjunctivae and EOM are normal.  Neck: Neck supple.  Cardiovascular: Normal rate and regular rhythm.  No murmur heard. Pulmonary/Chest: Effort normal.  Abdominal: Soft. Bowel sounds are normal.  Genitourinary: Rectal exam shows internal hemorrhoid and tenderness (with internal exam). Rectal exam  shows no external hemorrhoid, no fissure and anal tone normal.  Neurological: She is alert.  Skin: Skin is warm and dry. She is not diaphoretic.  Right foot with small (<0.2 cm) raised dark lesion on dorsal surface. Trace to 1+ edema and reticular hyperpigmented rash on ankle      BP 126/88   Pulse 66   Temp 98.4 F (36.9 C)   Resp 10   Ht 5\' 5"  (1.651 m)   Wt 222 lb 0.8 oz (100.7 kg)   BMI 36.95 kg/m  Assessment & Plan:  Theresa Lewis currently on warfarin who presents with bleeding hemorrhoids  Problem List Items Addressed This Visit      Cardiovascular and Mediastinum   Bleeding internal hemorrhoids - Primary    Exam painful but with no obvious external hemorrhoids and only internal palpated. Will trial steroid cream and place referral if not improving. Given anticoagulation will check CBC today      Relevant Medications   hydrocortisone (ANUSOL-HC) 2.5 % rectal cream   Other Relevant Orders   Ambulatory referral to Gastroenterology   CBC     Digestive   Gastroesophageal reflux disease    Already on omeprazole but taking before eating. Instructed to take after eating. Return for re-assessment        Other Visit Diagnoses    Secondary hypercoagulable state ([place])       Relevant Orders  CBC   Allergic dermatitis         -- Suspect some sort of allergic reaction to possible spider bit for foot. As it is improving recommend compression stockings and elevation to help with swelling and to continue anti-itch cream. If not resolving or worsening return to clinic  Lesleigh Noe, MD

## 2018-04-08 NOTE — Assessment & Plan Note (Signed)
Exam painful but with no obvious external hemorrhoids and only internal palpated. Will trial steroid cream and place referral if not improving. Given anticoagulation will check CBC today

## 2018-04-09 ENCOUNTER — Telehealth: Payer: Self-pay

## 2018-04-09 MED ORDER — HYDROCORTISONE 2.5 % RE CREA: 1.0000 "application " | TOPICAL_CREAM | Freq: Three times a day (TID) | RECTAL | 0 refills | Status: DC

## 2018-04-09 NOTE — Telephone Encounter (Signed)
Left message for patient to call back. Resent [facility] [place]. RX from yesterday did not go through.-Chareese Sergent [employer], RMA

## 2018-04-09 NOTE — Addendum Note (Signed)
Addended by: Kris Mouton on: 04/09/2018 10:31 AM   Modules accepted: Orders

## 2018-04-09 NOTE — Telephone Encounter (Signed)
Called patient and left a detailed message on voicemail( ok per DPR on file) advising patient of below.

## 2018-04-13 ENCOUNTER — Other Ambulatory Visit: Payer: Self-pay | Admitting: Primary Care

## 2018-04-14 MED ORDER — OMEPRAZOLE 20 MG PO CPDR: 20.0000 mg | DELAYED_RELEASE_CAPSULE | Freq: Every day | ORAL | 0 refills | Status: DC

## 2018-04-14 NOTE — Addendum Note (Signed)
Addended by: Jacqualin Combes on: 04/14/2018 08:33 AM   Modules accepted: Orders

## 2018-04-16 ENCOUNTER — Telehealth: Payer: Self-pay | Admitting: Primary Care

## 2018-04-16 NOTE — Telephone Encounter (Signed)
Pt called office to reschedule coumadin visit. Pt stated she prefers to come in on Friday December 7th to do the labs. Please place orders

## 2018-04-20 NOTE — Telephone Encounter (Signed)
Patient is on the schedule for labs for 12/6.  Standing orders should already be in place for pt/inr check.  Thanks.

## 2018-04-29 ENCOUNTER — Ambulatory Visit: Payer: BC Managed Care – PPO

## 2018-04-30 ENCOUNTER — Other Ambulatory Visit (INDEPENDENT_AMBULATORY_CARE_PROVIDER_SITE_OTHER): Payer: BC Managed Care – PPO

## 2018-04-30 ENCOUNTER — Ambulatory Visit: Payer: BC Managed Care – PPO | Admitting: Gastroenterology

## 2018-04-30 DIAGNOSIS — Z7901 Long term (current) use of anticoagulants: Secondary | ICD-10-CM

## 2018-04-30 DIAGNOSIS — Z86718 Personal history of other venous thrombosis and embolism: Secondary | ICD-10-CM | POA: Diagnosis not present

## 2018-04-30 DIAGNOSIS — K648 Other hemorrhoids: Secondary | ICD-10-CM | POA: Diagnosis not present

## 2018-04-30 DIAGNOSIS — D6869 Other thrombophilia: Secondary | ICD-10-CM | POA: Diagnosis not present

## 2018-04-30 DIAGNOSIS — Z5181 Encounter for therapeutic drug level monitoring: Secondary | ICD-10-CM

## 2018-04-30 LAB — PROTIME-INR
INR: 2.7 ratio — ABNORMAL HIGH (ref 0.8–1.0)
Prothrombin Time: 31.4 s — ABNORMAL HIGH (ref 9.6–13.1)

## 2018-04-30 LAB — CBC
HCT: 39.5 % (ref 36.0–46.0)
Hemoglobin: 12.7 g/dL (ref 12.0–15.0)
MCHC: 32.2 g/dL (ref 30.0–36.0)
MCV: 79.3 fl (ref 78.0–100.0)
Platelets: 336 10*3/uL (ref 150.0–400.0)
RBC: 4.99 Mil/uL (ref 3.87–5.11)
RDW: 15.6 % — ABNORMAL HIGH (ref 11.5–15.5)
WBC: 4.8 10*3/uL (ref 4.0–10.5)

## 2018-05-03 ENCOUNTER — Ambulatory Visit (INDEPENDENT_AMBULATORY_CARE_PROVIDER_SITE_OTHER): Payer: BC Managed Care – PPO | Admitting: General Practice

## 2018-05-03 DIAGNOSIS — Z7901 Long term (current) use of anticoagulants: Secondary | ICD-10-CM | POA: Diagnosis not present

## 2018-05-03 NOTE — Progress Notes (Signed)
Noted. Patient has been notified of dosing instructions.

## 2018-05-03 NOTE — Patient Instructions (Signed)
Pre visit review using our clinic review tool, if applicable. No additional management support is needed unless otherwise documented below in the visit note.  Continue to take 10mg  daily EXCEPT for 5mg  on Sun, Tue, and Thursday and 7.5 mg on Saturdays.  Recheck in 4 weeks.  INR resulted by lab at [facility].  I, C. Luciana Axe, RN called patient with dosing instructions.

## 2018-07-06 ENCOUNTER — Other Ambulatory Visit: Payer: Self-pay | Admitting: Primary Care

## 2018-09-08 ENCOUNTER — Ambulatory Visit (INDEPENDENT_AMBULATORY_CARE_PROVIDER_SITE_OTHER): Payer: BC Managed Care – PPO | Admitting: Gastroenterology

## 2018-09-08 ENCOUNTER — Other Ambulatory Visit: Payer: Self-pay

## 2018-09-08 ENCOUNTER — Telehealth: Payer: Self-pay | Admitting: Emergency Medicine

## 2018-09-08 ENCOUNTER — Encounter: Payer: Self-pay | Admitting: Gastroenterology

## 2018-09-08 VITALS — Ht 65.0 in | Wt 219.0 lb

## 2018-09-08 DIAGNOSIS — Z7901 Long term (current) use of anticoagulants: Secondary | ICD-10-CM | POA: Diagnosis not present

## 2018-09-08 DIAGNOSIS — K625 Hemorrhage of anus and rectum: Secondary | ICD-10-CM

## 2018-09-08 MED ORDER — AMBULATORY NON FORMULARY MEDICATION: 1 refills | Status: DC

## 2018-09-08 NOTE — Telephone Encounter (Signed)
   Theresa Lewis 08-20-1960 [id number]  Dear Dr. Carlis Abbott:  We have scheduled the above named patient for a colonoscopy procedure. Our records show that she is on anticoagulation therapy.  Please advise as to whether the patient may come off their therapy of Warfarin 5-7 days prior to their procedure which is scheduled for TBD.  Please route your response to Tinnie Gens, CMA or fax response to [phone].  Sincerely,    [facility]

## 2018-09-08 NOTE — Patient Instructions (Addendum)
CBC at your convenience.  go to the basement level for lab work any time Monday-Friday 8am-4pm. Press "B" on the elevator. The lab is located at the first door on the left as you exit the elevator.   Add daily Benefiber.  Let's try nitrogylcerin ointment 0.125% ointment applied three times daily x 8 weeks. We have sent a prescription for nitroglycerin 0.125% gel to [facility]. You should apply a pea size amount to your rectum three times daily x 6-8 weeks.  [facility]'s information is below: Address: [address] [place], [place] [address]  [phone]  *Please DO NOT go directly from our office to pick up this medication! Give the pharmacy 1 day to process the prescription as this is compounded at takes time to make.   I am recommending a colonoscopy after a warfarin washout. Call if you would like to proceed with colonoscopy before the [place] restrictions have been lifted. Otherwise, we will reach out to you to schedule this procedure as soon as possible.   Please call with any questions or concerns in the meantime.   Thank you for your patience with me and our technology today! Please stay home, safe, and healthy. I look forward to meeting you in person in the future.

## 2018-09-08 NOTE — Progress Notes (Signed)
TELEHEALTH VISIT  Referring Provider: Lesleigh Noe, MD Primary Care Physician:  Pleas Koch, NP   Tele-visit due to COVID-19 pandemic Patient requested visit virtually, consented to the virtual encounter via video enabled telemedicine application. Attempted Zoom download without success. Patient requested phone care.  Contact made at: 1:07pm 09/08/18 Patient verified by name and date of birth Location of patient: Home Location provider: [place] [facility] Names of persons participating: Me, patient, Tinnie Gens CMA Time spent on telehealth visit: 25 minutes I discussed the limitations of evaluation and management by telemedicine. The patient expressed understanding and agreed to proceed.  Reason for Consultation:  Rectal bleeding   IMPRESSION:  Rectal bleeding on warfarin, attributed to internal hemorrhoids on rectal exam with Dr. Einar Pheasant Chronic warfarin for antiphospholipid syndrome Colonoscopy with Dr. Olevia Perches for hematochezia 2010    - no hemorrhoids seen    - no cause for hematochezia identified Hyperplastic rectal polyps on colonoscopy 2010    - surveillance recommended 2020 No known family history of colon cancer or polyps  The differential for rectal bleeding is broad.  It includes outlet sources such as fissure or hemorrhoids, as well as polyps, mass, ulcers, and colitis.  Given this differential I am recommending a colonoscopy.  I have recommended holding warfarin before endoscopy.  I discussed with the patient that there is a low, but real, risk of a cardiovascular event such as heart attack, stroke, or embolism/thrombosis while off warfarin. Will communicate by phone or EMR with patient's prescribing provider to confirm that holding the warfarin is appropriate at this time.   She is anxious about coronavirus and wishes to avoid endoscopy until the restrictions are lifted. She understands the possibility for delayed diagnosis in this setting.    PLAN:  CBC Add daily Metamucil, Benefiber, or Citrucel Nitrogylcerin ointment 0.125% ointment TID x 8 weeks for possible anal fissure Colonoscopy after a warfarin washout after Covid19 restrictions have been lifted, earlier if she wishes to proceed  I consented the patient discussing the risks, benefits, and alternatives to endoscopic evaluation. In particular, we discussed the risks that include, but are not limited to, reaction to medication, cardiopulmonary compromise, bleeding requiring blood transfusion, aspiration resulting in pneumonia, perforation requiring surgery, lack of diagnosis, severe illness requiring hospitalization, and even death. We reviewed the risk of missed lesion including polyps or even cancer. The patient acknowledges these risks and asks that we proceed.   HPI: ASHANTEE DEUPREE is a 58 y.o. referred by Dr. Einar Pheasant. The history is obtained through the patient and review of her electronic health record. 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 Alma Friendly NP prescribes her warfarin. 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   History of intermittent bleeding attributed to hemorrhoids since her pregnancy. However, she has now had persistent rectal bleeding attributed to hemorrhoids since 08/16/18. She can clench her butt cheeks and feels pain related to the hemorrhoids. Bleeding with every BM in the blood and on the toilet paper. Denies constipation but has passed some large bowel movements. Intermittent pain with defecation. Avoids straining.  Unsure of tearing when symptoms first started. Feels like this is different from when she had an anal fissure >30 years ago. No melena.  No other associated symptoms. No identified exacerbating or relieving features.   Has tried Tucks, preparation H, Epson salts, Sitz baths - without any significant improvement  Similar symptoms this fall. Evaluated  by  Dr. Einar Pheasant at that time. Rectal exam was severely painful. Mrs. Ouderkirk remembers passing out because of the pain. She thought that NP Clark felt three hemorrhoids. Review of Dr. Verda Cumins office note from 04/08/18 showed tender internal hemorrhoids without fissure or external hemorrhoids. Dr. Einar Pheasant referred her to GI at that time, however, Mrs. Cabezas cancelled the appointment because the bleeding had improved.   Review of EPIC shows:  Labs 04/30/18: hgb 12.7, MCV 79.3, RDW 15.6  Colonoscopy with Dr. Olevia Perches for hematochezia 2010 showed three rectal hyperplastic polyps. No source of bleeding.   Father had surgery for bleeding hemorrhoids. No known family history of colon cancer or polyps. No family history of uterine/endometrial cancer, pancreatic cancer or gastric/stomach cancer.  Past Medical History:  Diagnosis Date  . ABNORMAL VAGINAL BLEEDING 04/19/2007   Qualifier: Diagnosis of  By: Hulan Saas, CMA (AAMA), [place] Skye   . Deep venous thrombosis ([place])    2006    3 x clotting   . HEMORRHOIDS, WITH BLEEDING 12/26/2008   Qualifier: Diagnosis of  By: Regis Bill MD, Standley Brooking   . PE (pulmonary embolism) 07/09/2010  . [facility]ism [facility]     Past Surgical History:  Procedure Laterality Date  . MYOMECTOMY    . TUBAL LIGATION    . vena caval filter      Current Outpatient Medications  Medication Sig Dispense Refill  . Biotin 5 MG TABS Take by mouth daily.    . [employer] STOOL SOFTENER 100 MG capsule TAKE 1 CAPSULE BY MOUTH AT BEDTIME AS NEEDED 30 capsule 0  . gabapentin (NEURONTIN) 100 MG capsule Taking 100 mg three times per day for one week, then increase to 200 mg three times per day for nerve pain.    . hydrocortisone (ANUSOL-HC) 2.5 % rectal cream Place 1 application rectally 3 (three) times daily. 30 g 0  . omeprazole (PRILOSEC) 20 MG capsule Take 1 capsule (20 mg total) by mouth daily. DUE FOR A PHYSICAL IN MAY PLEASE CALL OFFICE 90 capsule 0  . warfarin (COUMADIN) 5 MG tablet TAKE AS DIRECTED  BY ANTI-COAGULATION CLINIC. 180 tablet 1   No current facility-administered medications for this visit.     Allergies as of 09/08/2018 - Review Complete 09/08/2018  Allergen Reaction Noted  . Clindamycin/lincomycin  07/21/2011  . Tape Other (See Comments) 11/17/2014    Family History  Problem Relation Age of Onset  . Lung cancer Father 73       april 12   . Pancreatic cancer Father 83  . Hypertension Mother   . Rheum arthritis Mother   . Diabetes Paternal Aunt        x2  . Colon cancer Neg Hx   . Colon polyps Neg Hx   . Kidney disease Neg Hx   . Esophageal cancer Neg Hx   . Gallbladder disease Neg Hx     Social History   Socioeconomic History  . Marital status: Married    Spouse name: Not on file  . Number of children: 1  . Years of education: Not on file  . Highest education level: Not on file  Occupational History  . Occupation: Microbiologist   Social Needs  . Financial resource strain: Not on file  . Food insecurity:    Worry: Not on file    Inability: Not on file  . Transportation needs:    Medical: Not on file    Non-medical: Not on file  Tobacco Use  . Smoking status: Never Smoker  .  Smokeless tobacco: Never Used  Substance and Sexual Activity  . Alcohol use: Yes    Alcohol/week: 0.0 standard drinks    Comment: rarely  . Drug use: No  . Sexual activity: Not on file  Lifestyle  . Physical activity:    Days per week: Not on file    Minutes per session: Not on file  . Stress: Not on file  Relationships  . Social connections:    Talks on phone: Not on file    Gets together: Not on file    Attends religious service: Not on file    Active member of club or organization: Not on file    Attends meetings of clubs or organizations: Not on file    Relationship status: Not on file  . Intimate partner violence:    Fear of current or ex partner: Not on file    Emotionally abused: Not on file    Physically abused: Not on file    Forced sexual activity:  Not on file  Other Topics Concern  . Not on file  Social History Narrative   HH of 3  Daughter 62 and self.  hh of 2    No pets.   No ets.   No etoh.    Working for [employer] and [employer] .  45- 50 hours per week.                 Review of Systems: ALL ROS discussed and all others negative except listed in HPI.  Physical Exam: General: in no acute distress Neuro: Alert and appropriate Psych: Normal affect and normal insight Exam otherwise limited due to telehealth [employer] L. Tarri Glenn, MD, MPH [facility] 09/08/2018, 1:24 PM

## 2018-09-09 NOTE — Telephone Encounter (Signed)
Patient needs to have INR checked.  Her last visit was 12/9.

## 2018-09-09 NOTE — Telephone Encounter (Signed)
I will also copy Villa Herb, RN on this as well.  However, we cannot make any plans or arrangements until we have a date.    Will hold this to monitor.   Thanks.

## 2018-09-09 NOTE — Telephone Encounter (Signed)
Noted. I will confirm procedure date with you when covid19 restrictions are lifted.

## 2018-09-09 NOTE — Telephone Encounter (Signed)
Warfarin may be held for five days prior to scheduled procedure and should be resumed as soon as possible. She will also need close INR monitoring afterward. I will also forward this to our Coumadin Clinic RN who may help coordinate INR testing before and after procedure.

## 2018-09-13 NOTE — Addendum Note (Signed)
Addended by: Donell Beers on: 09/13/2018 09:36 PM   Modules accepted: Level of Service

## 2018-09-14 NOTE — Telephone Encounter (Signed)
Spoke with Villa Herb, RN on 4/20, she will contact patient to set up ASAP for INR check since patient is way past due.

## 2018-09-16 ENCOUNTER — Ambulatory Visit (INDEPENDENT_AMBULATORY_CARE_PROVIDER_SITE_OTHER): Payer: BC Managed Care – PPO | Admitting: General Practice

## 2018-09-16 ENCOUNTER — Other Ambulatory Visit: Payer: Self-pay

## 2018-09-16 ENCOUNTER — Other Ambulatory Visit (INDEPENDENT_AMBULATORY_CARE_PROVIDER_SITE_OTHER): Payer: BC Managed Care – PPO

## 2018-09-16 DIAGNOSIS — Z7901 Long term (current) use of anticoagulants: Secondary | ICD-10-CM | POA: Diagnosis not present

## 2018-09-16 DIAGNOSIS — Z86718 Personal history of other venous thrombosis and embolism: Secondary | ICD-10-CM

## 2018-09-16 DIAGNOSIS — D6869 Other thrombophilia: Secondary | ICD-10-CM

## 2018-09-16 DIAGNOSIS — Z5181 Encounter for therapeutic drug level monitoring: Secondary | ICD-10-CM

## 2018-09-16 LAB — POCT INR: INR: 2.6 (ref 2.0–3.0)

## 2018-09-16 NOTE — Patient Instructions (Signed)
Pre visit review using our clinic review tool, if applicable. No additional management support is needed unless otherwise documented below in the visit note.  Continue to take 10mg  daily EXCEPT for 5mg  on Sun, Tue, and Thursday and 7.5 mg on Saturdays.  Recheck in 4 weeks.  INR resulted by lab at [facility].

## 2018-10-07 ENCOUNTER — Other Ambulatory Visit: Payer: Self-pay | Admitting: Primary Care

## 2018-10-14 ENCOUNTER — Other Ambulatory Visit: Payer: Self-pay | Admitting: Emergency Medicine

## 2018-10-14 DIAGNOSIS — K625 Hemorrhage of anus and rectum: Secondary | ICD-10-CM

## 2018-10-14 DIAGNOSIS — Z7901 Long term (current) use of anticoagulants: Secondary | ICD-10-CM

## 2018-10-14 MED ORDER — NA SULFATE-K SULFATE-MG SULF 17.5-3.13-1.6 GM/177ML PO SOLN: 1.0000 | ORAL | 0 refills | Status: DC

## 2018-10-15 ENCOUNTER — Encounter: Payer: Self-pay | Admitting: Gastroenterology

## 2018-10-20 ENCOUNTER — Telehealth: Payer: Self-pay

## 2018-10-20 NOTE — Telephone Encounter (Signed)
Patient calls in to cancel her inr check on 10/28/18 stating that she is actually scheduled for a colonoscopy for 10/28/18 and needs hold instructions.   She is going to come in tomorrow for INR check and told her that when we call her with results will review 5 day hold plan and boost with her at that time.  In meantime, will consult with PCP for determination if possible need for a lovenox bridge.   Patient does not have a valve replacement and is on coumadin due to history of DVT and PE (2013).  I do not see where we have ever bridged her before with lovenox but want to defer to your opinion as to if you feel this is necessary based on her medical history.   Please advise and I will also copy Jenny Reichmann on this so we can plan with patient tomorrow as procedure is next week.   Thanks.

## 2018-10-20 NOTE — Telephone Encounter (Signed)
Based off of current guidelines and her medical history she does not meet criteria for bridging with lovenox. She does not have atrial fibrillation, mechanical heart valve, recent PE/DVT, recent stroke. Given these factors she is considered low risk for development of DVT during a temporary hold of her warfarin.   Agree to hold without bridging with lovenox.

## 2018-10-21 ENCOUNTER — Other Ambulatory Visit (INDEPENDENT_AMBULATORY_CARE_PROVIDER_SITE_OTHER): Payer: BC Managed Care – PPO

## 2018-10-21 ENCOUNTER — Ambulatory Visit (INDEPENDENT_AMBULATORY_CARE_PROVIDER_SITE_OTHER): Payer: BC Managed Care – PPO | Admitting: General Practice

## 2018-10-21 DIAGNOSIS — Z86718 Personal history of other venous thrombosis and embolism: Secondary | ICD-10-CM

## 2018-10-21 DIAGNOSIS — Z7901 Long term (current) use of anticoagulants: Secondary | ICD-10-CM | POA: Diagnosis not present

## 2018-10-21 DIAGNOSIS — D6869 Other thrombophilia: Secondary | ICD-10-CM

## 2018-10-21 DIAGNOSIS — Z5181 Encounter for therapeutic drug level monitoring: Secondary | ICD-10-CM

## 2018-10-21 DIAGNOSIS — K625 Hemorrhage of anus and rectum: Secondary | ICD-10-CM | POA: Diagnosis not present

## 2018-10-21 LAB — CBC WITH DIFFERENTIAL/PLATELET
Basophils Absolute: 0.1 10*3/uL (ref 0.0–0.1)
Basophils Relative: 1.3 % (ref 0.0–3.0)
Eosinophils Absolute: 0.2 10*3/uL (ref 0.0–0.7)
Eosinophils Relative: 2.9 % (ref 0.0–5.0)
HCT: 38 % (ref 36.0–46.0)
Hemoglobin: 12.5 g/dL (ref 12.0–15.0)
Lymphocytes Relative: 31.4 % (ref 12.0–46.0)
Lymphs Abs: 1.7 10*3/uL (ref 0.7–4.0)
MCHC: 33 g/dL (ref 30.0–36.0)
MCV: 79.5 fl (ref 78.0–100.0)
Monocytes Absolute: 0.3 10*3/uL (ref 0.1–1.0)
Monocytes Relative: 6.5 % (ref 3.0–12.0)
Neutro Abs: 3.1 10*3/uL (ref 1.4–7.7)
Neutrophils Relative %: 57.9 % (ref 43.0–77.0)
Platelets: 317 10*3/uL (ref 150.0–400.0)
RBC: 4.78 Mil/uL (ref 3.87–5.11)
RDW: 15.2 % (ref 11.5–15.5)
WBC: 5.4 10*3/uL (ref 4.0–10.5)

## 2018-10-21 LAB — POCT INR: INR: 2.7 (ref 2.0–3.0)

## 2018-10-21 NOTE — Patient Instructions (Addendum)
Pre visit review using our clinic review tool, if applicable. No additional management support is needed unless otherwise documented below in the visit note.  Continue to take 10mg  daily EXCEPT for 5mg  on Sun, Tue, and Thursday and 7.5 mg on Saturdays.  Pt has procedure on 6/4 and will need to hold coumadin for 5 days with last dose on Saturday 5/30. See patient instructions. Re-check on Thursday 6/11  LMOVM and asked patient to call me back to verify instructions.   Instructions for coumadin pre and post procedure.  5/30 - Last dose of coumadin until after procedure.  6/5 and 6/6 - 3 tablets (15 mg)  6/7 and 6/8 -  7.5 mg (1 - 7.5 mg tablet)  6/11 - Re-check INR

## 2018-10-21 NOTE — Telephone Encounter (Signed)
Noted! Thank you

## 2018-10-26 ENCOUNTER — Telehealth: Payer: Self-pay | Admitting: *Deleted

## 2018-10-26 NOTE — Telephone Encounter (Signed)
Covid-19 screening questions no Have you traveled in the last 14 days?no If yes where?  Do you now or have you had a fever in the last 14 days?no  Do you have any respiratory symptoms of shortness of breath or cough now or in the last 14 days?no  Do you have any family members or close contacts with diagnosed or suspected Covid-19 in the past 14 days?no  Have you been tested for Covid-19 and found to be positive?no  Pt made aware of care partner policy and will bring a mask with her. SM

## 2018-10-28 ENCOUNTER — Other Ambulatory Visit: Payer: BC Managed Care – PPO

## 2018-10-28 ENCOUNTER — Encounter: Payer: Self-pay | Admitting: Gastroenterology

## 2018-10-28 ENCOUNTER — Other Ambulatory Visit: Payer: Self-pay

## 2018-10-28 ENCOUNTER — Ambulatory Visit (AMBULATORY_SURGERY_CENTER): Payer: BC Managed Care – PPO | Admitting: Gastroenterology

## 2018-10-28 VITALS — BP 116/69 | HR 50 | Temp 98.8°F | Resp 16 | Ht 65.0 in | Wt 219.0 lb

## 2018-10-28 DIAGNOSIS — K649 Unspecified hemorrhoids: Secondary | ICD-10-CM | POA: Diagnosis not present

## 2018-10-28 DIAGNOSIS — D122 Benign neoplasm of ascending colon: Secondary | ICD-10-CM | POA: Diagnosis present

## 2018-10-28 DIAGNOSIS — D124 Benign neoplasm of descending colon: Secondary | ICD-10-CM

## 2018-10-28 DIAGNOSIS — K5730 Diverticulosis of large intestine without perforation or abscess without bleeding: Secondary | ICD-10-CM | POA: Diagnosis not present

## 2018-10-28 DIAGNOSIS — K625 Hemorrhage of anus and rectum: Secondary | ICD-10-CM

## 2018-10-28 DIAGNOSIS — D123 Benign neoplasm of transverse colon: Secondary | ICD-10-CM

## 2018-10-28 MED ORDER — SODIUM CHLORIDE 0.9 % IV SOLN: 500.0000 mL | Freq: Once | INTRAVENOUS | Status: DC

## 2018-10-28 NOTE — Progress Notes (Signed)
Called to room to assist during endoscopic procedure.  Patient ID and intended procedure confirmed with present staff. Received instructions for my participation in the procedure from the performing physician.  

## 2018-10-28 NOTE — Patient Instructions (Signed)
Handouts Given:  Polyps, Diverticulosis, and Hemorrhoids  YOU HAD AN ENDOSCOPIC PROCEDURE TODAY AT [facility]:   Refer to the procedure report that was given to you for any specific questions about what was found during the examination.  If the procedure report does not answer your questions, please call your gastroenterologist to clarify.  If you requested that your care partner not be given the details of your procedure findings, then the procedure report has been included in a sealed envelope for you to review at your convenience later.  YOU SHOULD EXPECT: Some feelings of bloating in the abdomen. Passage of more gas than usual.  Walking can help get rid of the air that was put into your GI tract during the procedure and reduce the bloating. If you had a lower endoscopy (such as a colonoscopy or flexible sigmoidoscopy) you may notice spotting of blood in your stool or on the toilet paper. If you underwent a bowel prep for your procedure, you may not have a normal bowel movement for a few days.  Please Note:  You might notice some irritation and congestion in your nose or some drainage.  This is from the oxygen used during your procedure.  There is no need for concern and it should clear up in a day or so.  SYMPTOMS TO REPORT IMMEDIATELY:   Following lower endoscopy (colonoscopy or flexible sigmoidoscopy):  Excessive amounts of blood in the stool  Significant tenderness or worsening of abdominal pains  Swelling of the abdomen that is new, acute  Fever of 100F or higher  For urgent or emergent issues, a gastroenterologist can be reached at any hour by calling [phone].   DIET:  We do recommend a small meal at first, but then you may proceed to your regular diet.  Drink plenty of fluids but you should avoid alcoholic beverages for 24 hours.  ACTIVITY:  You should plan to take it easy for the rest of today and you should NOT DRIVE or use heavy machinery until tomorrow  (because of the sedation medicines used during the test).    FOLLOW UP: Our staff will call the number listed on your records 48-72 hours following your procedure to check on you and address any questions or concerns that you may have regarding the information given to you following your procedure. If we do not reach you, we will leave a message.  We will attempt to reach you two times.  During this call, we will ask if you have developed any symptoms of COVID 19. If you develop any symptoms (ie: fever, flu-like symptoms, shortness of breath, cough etc.) before then, please call [phone].  If you test positive for Covid 19 in the 2 weeks post procedure, please call and report this information to [place].    If any biopsies were taken you will be contacted by phone or by letter within the next 1-3 weeks.  Please call us at [phone] if you have not heard about the biopsies in 3 weeks.    SIGNATURES/CONFIDENTIALITY: You and/or your care partner have signed paperwork which will be entered into your electronic medical record.  These signatures attest to the fact that that the information above on your After Visit Summary has been reviewed and is understood.  Full responsibility of the confidentiality of this discharge information lies with you and/or your care-partner.

## 2018-10-28 NOTE — Op Note (Signed)
[facility] Patient Name: Theresa Lewis Procedure Date: 10/28/2018 7:34 AM MRN: [id number] Endoscopist: Thornton Park MD, MD Age: 58 Referring MD:  Date of Birth: 05-14-1961 Gender: Female Account #: [id number] Procedure:                Colonoscopy Indications:              Rectal bleeding - improving on Nitroglycerin                            ointment                           Rectal bleeding on warfarin, attributed to internal                            hemorrhoids on rectal exam with Dr. Einar Pheasant                           Chronic warfarin for antiphospholipid syndrome                           Colonoscopy with Dr. Olevia Perches for hematochezia 2010                           - no hemorrhoids seen                           - no cause for hematochezia identified                           Hyperplastic rectal polyps on colonoscopy 2010                           - surveillance recommended 2020                           No known family history of colon cancer or polyps Medicines:                See the Anesthesia note for documentation of the                            administered medications Procedure:                Pre-Anesthesia Assessment:                           - Prior to the procedure, a History and Physical                            was performed, and patient medications and                            allergies were reviewed. The patient's tolerance of                            previous anesthesia was also reviewed. The risks  and benefits of the procedure and the sedation                            options and risks were discussed with the patient.                            All questions were answered, and informed consent                            was obtained. Prior Anticoagulants: The patient has                            taken Coumadin (warfarin), last dose was 5 days                            prior to procedure. ASA Grade Assessment:  II - A                            patient with mild systemic disease. After reviewing                            the risks and benefits, the patient was deemed in                            satisfactory condition to undergo the procedure.                           After obtaining informed consent, the colonoscope                            was passed under direct vision. Throughout the                            procedure, the patient's blood pressure, pulse, and                            oxygen saturations were monitored continuously. The                            Colonoscope was introduced through the anus and                            advanced to the the cecum, identified by                            appendiceal orifice and ileocecal valve. The                            colonoscopy was performed with moderate difficulty                            due to a redundant colon, significant looping and a  tortuous colon. Successful completion of the                            procedure was aided by applying abdominal pressure.                            The patient tolerated the procedure well. The                            quality of the bowel preparation was excellent. The                            ileocecal valve, appendiceal orifice, and rectum                            were photographed. Scope In: 7:43:25 AM Scope Out: 8:01:05 AM Scope Withdrawal Time: 0 hours 13 minutes 23 seconds  Total Procedure Duration: 0 hours 17 minutes 40 seconds  Findings:                 A few small-mouthed diverticula were found in the                            sigmoid colon, descending colon and ascending colon.                           A 3 mm polyp was found in the ascending colon. The                            polyp was sessile. The polyp was removed with a                            cold snare. Resection and retrieval were complete.                            Estimated  blood loss was minimal.                           A 3 mm polyp was found in the proximal transverse                            colon. The polyp was sessile. The polyp was removed                            with a cold snare. Resection and retrieval were                            complete. Estimated blood loss was minimal.                           A 2 mm polyp was found in the transverse colon. The  polyp was sessile. The polyp was removed with a                            cold biopsy forceps. Resection and retrieval were                            complete. Estimated blood loss was minimal.                           A 2 mm polyp was found in the descending colon. The                            polyp was sessile. The polyp was removed with a                            cold snare. Resection and retrieval were complete.                            Estimated blood loss was minimal.                           Non-bleeding internal hemorrhoids were found during                            retroflexion. The hemorrhoids were small.                           A healing anal fissure was found on perianal exam.                            A small pustule of unclear clinical significance                            was seen adjacent to the rectum. Does not appear to                            be a healed fistula. Complications:            No immediate complications. Estimated blood loss:                            Minimal. Estimated Blood Loss:     Estimated blood loss was minimal. Impression:               - Diverticulosis in the sigmoid colon, in the                            descending colon and in the ascending colon.                           - One 3 mm polyp in the ascending colon, removed                            with a cold snare. Resected and  retrieved.                           - One 3 mm polyp in the proximal transverse colon,                            removed with  a cold snare. Resected and retrieved.                           - One 2 mm polyp in the transverse colon, removed                            with a cold biopsy forceps. Resected and retrieved.                           - One 2 mm polyp in the descending colon, removed                            with a cold snare. Resected and retrieved.                           - Non-bleeding internal hemorrhoids.                           - Anal fissure found on perianal exam.                           - Perirectal pustule. Recommendation:           - Patient has a contact number available for                            emergencies. The signs and symptoms of potential                            delayed complications were discussed with the                            patient. Return to normal activities tomorrow.                            Written discharge instructions were provided to the                            patient.                           - Resume regular diet today. High fiber diet                            recommended.                           - Resume Coumadin (warfarin) at prior dose  tomorrow. Refer to managing physician for further                            adjustment of therapy.                           - Await pathology results.                           - Nitroglycerin ointment 0.125% applied TID with                            additional bleeding                           - Repeat colonoscopy in 3 years for surveillance if                            at least 3 polyps are adenomatous.                           - Non-urgent teferral to Dr. Nadeen Landau to                            evaluate the perirectal lesion. Thornton Park MD, MD 10/28/2018 8:20:08 AM This report has been signed electronically.

## 2018-10-28 NOTE — Progress Notes (Signed)
Report given to PACU, vss 

## 2018-10-28 NOTE — Progress Notes (Signed)
Courtney Washington took temp and Judy Branson took vitals. 

## 2018-10-28 NOTE — Progress Notes (Signed)
Pt's states no medical or surgical changes since previsit or office visit. 

## 2018-10-29 ENCOUNTER — Telehealth: Payer: Self-pay

## 2018-10-29 NOTE — Telephone Encounter (Signed)
Demographics, insurance card, office notes and procedure report faxed to [facility] for a referral to Dr Dema Severin for evaluation of perirectal lesion.

## 2018-10-31 ENCOUNTER — Other Ambulatory Visit: Payer: Self-pay | Admitting: Primary Care

## 2018-11-01 ENCOUNTER — Telehealth: Payer: Self-pay

## 2018-11-01 ENCOUNTER — Telehealth: Payer: Self-pay | Admitting: Gastroenterology

## 2018-11-01 ENCOUNTER — Encounter: Payer: Self-pay | Admitting: Gastroenterology

## 2018-11-01 NOTE — Telephone Encounter (Signed)
Attempted to call patient back. Patient forwarded call to VM. Left message for patient to call back.

## 2018-11-01 NOTE — Telephone Encounter (Signed)
Patient called said that she had a colonoscopy on 6-4  For rectal bleeding and is still having Rectal bleeding when she wipes. Would like to speak to someone

## 2018-11-01 NOTE — Telephone Encounter (Signed)
Follow up call made, left a voicemail. 

## 2018-11-01 NOTE — Telephone Encounter (Signed)
When patient answered, was asked to call back. Patient was on a staff call.

## 2018-11-01 NOTE — Telephone Encounter (Signed)
Charted in error.

## 2018-11-03 NOTE — Telephone Encounter (Signed)
Called the patient to follow up due to the patient not returning the phone call. The patient stated that the bleeding in resolving and that she is continuing to use the Nitroglycerin rectally which is helping. The patient stated she would call back if she needed a refill. Nothing further at the time of the phone call.

## 2018-11-04 ENCOUNTER — Ambulatory Visit (INDEPENDENT_AMBULATORY_CARE_PROVIDER_SITE_OTHER): Payer: BC Managed Care – PPO

## 2018-11-04 ENCOUNTER — Other Ambulatory Visit (INDEPENDENT_AMBULATORY_CARE_PROVIDER_SITE_OTHER): Payer: BC Managed Care – PPO

## 2018-11-04 ENCOUNTER — Other Ambulatory Visit: Payer: Self-pay

## 2018-11-04 DIAGNOSIS — D6869 Other thrombophilia: Secondary | ICD-10-CM | POA: Diagnosis not present

## 2018-11-04 DIAGNOSIS — Z5181 Encounter for therapeutic drug level monitoring: Secondary | ICD-10-CM

## 2018-11-04 DIAGNOSIS — Z86718 Personal history of other venous thrombosis and embolism: Secondary | ICD-10-CM

## 2018-11-04 DIAGNOSIS — Z7901 Long term (current) use of anticoagulants: Secondary | ICD-10-CM | POA: Diagnosis not present

## 2018-11-04 LAB — POCT INR: INR: 2.9 (ref 2.0–3.0)

## 2018-11-04 NOTE — Patient Instructions (Addendum)
Pre visit review using our clinic review tool, if applicable. No additional management support is needed unless otherwise documented below in the visit note.  Continue to take 10mg  daily EXCEPT for 5mg  on Sun, Tue, and Thursday and 7.5 mg on Saturdays.  Re-check in 6 weeks.

## 2018-11-17 ENCOUNTER — Ambulatory Visit: Payer: Self-pay | Admitting: Surgery

## 2018-11-17 NOTE — H&P (Signed)
Theresa Lewis Documented: 11/17/2018 11:19 AM Location: [facility] Patient #: [id number] DOB: March 29, 1961 Married / Language: English / Race: Black or African American Female  History of Present Illness Adin Hector MD; 11/17/2018 1:30 PM) The patient is a 58 year old female who presents with anal lesions. Note for "Anal lesions": ` ` ` Patient sent for surgical consultation at the request of Dr. Thornton Park, [facility]  Chief Complaint: Perianal nodule. Anal fissure. 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 She's had intermittent hematochezia & anal pain, especially the past decade. Had a colonoscopy in 2010 that was rather underwhelming. Hyperplastic polyps noted. Underwent colonoscopy earlier this month. Polyps removed x4 consistent with tubular adenomas. Three-year follow-up recommended. Anal fissure suspected. Perianal nodule noted as well. Surgical consultation requested.  Patient comes a bowel resolved. CT scan additionally had hard stools with straining. She recently was started on Benefiber supplement which has helped. She describes having soreness at her anus for a long time. Worse with sitting. Some irritation. She recalls being told she had an anal fissure before. His more recent episode of some pain and discomfort. No severe sharp pain with bowel movements. No major bleeding. She sometimes feels like something pops out when she moves her bowels but he goes back in. Not having to push it back in. Not remember any history of any abscess. No major leaking or soiling. Usually tries to pop in the shower or 2 a quick sitz bath after every bowel movement. He gets uncomfortable to sit after while with itching and stinging. Frustrating. She was placed on nitroglycerin cream for presumption of anal fissure. She feels like the cream is somewhat helpful but  its never really gone away. She's been taking cream for months she said without resolution.  No personal nor family history of GI/colon cancer, inflammatory bowel disease, irritable bowel syndrome, allergy such as Celiac Sprue, dietary/dairy problems, colitis, ulcers nor gastritis. No recent sick contacts/gastroenteritis. No travel outside the country. No changes in diet. No dysphagia to solids or liquids. No significant heartburn or reflux. No hematochezia, hematemesis, coffee ground emesis. No evidence of prior gastric/peptic ulceration.  (Review of systems as stated in this history (HPI) or in the review of systems. Otherwise all other 12 point ROS are negative) ` ` `   Past Surgical History Nance Pew, CMA; 11/17/2018 11:19 AM) Anal Fissure Repair Colon Polyp Removal - Colonoscopy  Diagnostic Studies History Nance Pew, CMA; 11/17/2018 11:19 AM) Mammogram 1-3 years ago Pap Smear 1-5 years ago  Allergies Nance Pew, CMA; 11/17/2018 11:22 AM) Tape 1/2"X10yd *MEDICAL DEVICES AND SUPPLIES* Cimetidine 200 *ULCER DRUGS/ANTISPASMODICS/ANTICHOLINERGICS* Allergies Reconciled  Medication History (Sabrina Canty, CMA; 11/17/2018 11:22 AM) Gabapentin (100MG  Capsule, Oral) Active. Omeprazole (20MG  Capsule DR, Oral) Active. Warfarin Sodium (5MG  Tablet, Oral) Active. Medications Reconciled  Social History Nance Pew, CMA; 11/17/2018 11:19 AM) Caffeine use Carbonated beverages, Tea. Tobacco use Former smoker.  Family History Nance Pew, [place]; 11/17/2018 11:19 AM) Arthritis Daughter, Mother. Bleeding disorder Sister. Malignant Neoplasm Of Pancreas Father. Migraine Headache Daughter.  Pregnancy / Birth History Nance Pew, [place]; 11/17/2018 11:19 AM) Age at menarche 51 years. Age of menopause 51-55 Contraceptive History Oral contraceptives. Gravida 1 Maternal age 20-30 Para 1  Other Problems Nance Pew, CMA; 11/17/2018 11:19  AM) Gastroesophageal Reflux Disease Hemorrhoids Pulmonary Embolism / Blood Clot in Legs     Review of Systems ([place]; 11/17/2018 11:19 AM) General  Not Present- Appetite Loss, Chills, Fatigue, Fever, Night Sweats, Weight Gain and Weight Loss. Skin Not Present- Change in Wart/Mole, Dryness, Hives, Jaundice, New Lesions, Non-Healing Wounds, Rash and Ulcer. HEENT Present- Wears glasses/contact lenses. Not Present- Earache, Hearing Loss, Hoarseness, Nose Bleed, Oral Ulcers, Ringing in the Ears, Seasonal Allergies, Sinus Pain, Sore Throat, Visual Disturbances and Yellow Eyes. Respiratory Not Present- Bloody sputum, Chronic Cough, Difficulty Breathing, Snoring and Wheezing. Cardiovascular Not Present- Chest Pain, Difficulty Breathing Lying Down, Leg Cramps, Palpitations, Rapid Heart Rate, Shortness of Breath and Swelling of Extremities. Gastrointestinal Present- Hemorrhoids and Rectal Pain. Not Present- Abdominal Pain, Bloating, Bloody Stool, Change in Bowel Habits, Chronic diarrhea, Constipation, Difficulty Swallowing, Excessive gas, Gets full quickly at meals, Indigestion, Nausea and Vomiting. Female Genitourinary Not Present- Frequency, Nocturia, Painful Urination, Pelvic Pain and Urgency. Musculoskeletal Not Present- Back Pain, Joint Pain, Joint Stiffness, Muscle Pain, Muscle Weakness and Swelling of Extremities. Neurological Not Present- Decreased Memory, Fainting, Headaches, Numbness, Seizures, Tingling, Tremor, Trouble walking and Weakness. Psychiatric Not Present- Anxiety, Bipolar, Change in Sleep Pattern, Depression, Fearful and Frequent crying. Endocrine Not Present- Cold Intolerance, Excessive Hunger, Hair Changes, Heat Intolerance, Hot flashes and New Diabetes. Hematology Present- Blood Thinners. Not Present- Easy Bruising, Excessive bleeding, Gland problems, HIV and Persistent Infections.  Vitals (Sabrina Canty CMA; 11/17/2018 11:23 AM) 11/17/2018 11:22 AM Weight: 223.2 lb  Height: 65in Body Surface Area: 2.07 m Body Mass Index: 37.14 kg/m  Temp.: 97.23F(Temporal)  Pulse: 98 (Regular)  BP: 128/60 (Sitting, Left Arm, Standard)        Physical Exam Adin Hector MD; 11/17/2018 1:30 PM)  General Mental Status-Alert. General Appearance-Not in acute distress, Not Sickly. Orientation-Oriented X3. Hydration-Well hydrated. Voice-Normal.  Integumentary Global Assessment Upon inspection and palpation of skin surfaces of the - Axillae: non-tender, no inflammation or ulceration, no drainage. and Distribution of scalp and body hair is normal. General Characteristics Temperature - normal warmth is noted.  Head and Neck Head-normocephalic, atraumatic with no lesions or palpable masses. Face Global Assessment - atraumatic, no absence of expression. Neck Global Assessment - no abnormal movements, no bruit auscultated on the right, no bruit auscultated on the left, no decreased range of motion, non-tender. Trachea-midline. Thyroid Gland Characteristics - non-tender.  Eye Eyeball - Left-Extraocular movements intact, No Nystagmus - Left. Eyeball - Right-Extraocular movements intact, No Nystagmus - Right. Cornea - Left-No Hazy - Left. Cornea - Right-No Hazy - Right. Sclera/Conjunctiva - Left-No scleral icterus, No Discharge - Left. Sclera/Conjunctiva - Right-No scleral icterus, No Discharge - Right. Pupil - Left-Direct reaction to light normal. Pupil - Right-Direct reaction to light normal.  ENMT Ears Pinna - Left - no drainage observed, no generalized tenderness observed. Pinna - Right - no drainage observed, no generalized tenderness observed. Nose and Sinuses External Inspection of the Nose - no destructive lesion observed. Inspection of the nares - Left - quiet respiration. Inspection of the nares - Right - quiet respiration. Mouth and Throat Lips - Upper Lip - no fissures observed, no pallor noted. Lower  Lip - no fissures observed, no pallor noted. Nasopharynx - no discharge present. Oral Cavity/Oropharynx - Tongue - no dryness observed. Oral Mucosa - no cyanosis observed. Hypopharynx - no evidence of airway distress observed.  Chest and Lung Exam Inspection Movements - Normal and Symmetrical. Accessory muscles - No use of accessory muscles in breathing. Palpation Palpation of the chest reveals - Non-tender. Auscultation Breath sounds - Normal and Clear.  Cardiovascular Auscultation Rhythm - Regular. Murmurs & Other Heart Sounds -  Auscultation of the heart reveals - No Murmurs and No Systolic Clicks.  Abdomen Inspection Inspection of the abdomen reveals - No Visible peristalsis and No Abnormal pulsations. Umbilicus - No Bleeding, No Urine drainage. Palpation/Percussion Palpation and Percussion of the abdomen reveal - Soft, Non Tender, No Rebound tenderness, No Rigidity (guarding) and No Cutaneous hyperesthesia. Note: Abdomen soft. Nontender. Not distended. No umbilical or incisional hernias. No guarding.  Female Genitourinary Note: Nontender. No inguinal hernias. No inguinal lymphadenopathy. No major vaginal bleeding nor foul discharge  Rectal Note: Posterior midline perianal small sinus draining clear and yellow fluid suspicious for fistula. Some hypopigmentation consistent with a chronic condition. Right anterior external hemorrhoids. Mildly increased sphincter tone. Apparently tolerates digital exam due to discomfort. Some fullness in the posterior anal canal but not a definite fissure. Mildly enlarged internal hemorrhoids found on the right greater than left. Held off on anoscopic exam. No pilonidal disease. Mild pruritis. No warts. No condyloma.  Peripheral Vascular Upper Extremity Inspection - Left - No Cyanotic nailbeds - Left, Not Ischemic. Inspection - Right - No Cyanotic nailbeds - Right, Not Ischemic.  Neurologic Neurologic evaluation reveals -normal attention span  and ability to concentrate, able to name objects and repeat phrases. Appropriate fund of knowledge , normal sensation and normal coordination. Mental Status Affect - not angry, not paranoid. Cranial Nerves-Normal Bilaterally. Gait-Normal.  Neuropsychiatric Mental status exam performed with findings of-able to articulate well with normal speech/language, rate, volume and coherence, thought content normal with ability to perform basic computations and apply abstract reasoning and no evidence of hallucinations, delusions, obsessions or homicidal/suicidal ideation.  Musculoskeletal Global Assessment Spine, Ribs and Pelvis - no instability, subluxation or laxity. Right Upper Extremity - no instability, subluxation or laxity.  Lymphatic Head & Neck  General Head & Neck Lymphatics: Bilateral - Description - No Localized lymphadenopathy. Axillary  General Axillary Region: Bilateral - Description - No Localized lymphadenopathy. Femoral & Inguinal  Generalized Femoral & Inguinal Lymphatics: Left - Description - No Localized lymphadenopathy. Right - Description - No Localized lymphadenopathy.    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 Hopefully short & superficial. She perhaps has an anal fissure involved as well (not as convincing on physical exam) but she is not improved on the nitroglycerin for the past several weeks.  I recommended examination under anesthesia with surgery to diagnose and treat the problem. If it is a superficial anal fistula, proceed with anal fistulotomy. If more intersphincteric then may need LIFT repair. If a fissure might need partial internal sphincterotomy. EUA also allows me to assess her hemorrhoids and make sure that is not a major issue. He has a history that suspects intermittent prolapse, but recent colonoscopy does not  seem to have them particularly enlarged or inflamed. She may just need ligation on a few piles. We will see. Perhaps excise the right external hemorrhoid pile.  She was open to get back to work right away since she can do work from home. I cautioned against that and recommended at least 2 weeks off before thinking about it. We will see.  Current Plans Pt Education - [facility] Abscess/Fistula (AT): discussed with patient and provided information. The anatomy & physiology of the anorectal region was discussed. We discussed the pathophysiology of anorectal abscess and fistula. Differential diagnosis was discussed. Natural history progression was discussed. I stressed the importance of a bowel regimen to have  daily soft bowel movements to minimize progression of disease.  The patient's condition is not adequately controlled. Non-operative treatment has not healed the fistula. Therefore, I recommended examination under anaesthesia to confirm the diagnosis and treat the fistula. I discussed techniques that may be required such as fistulotomy, ligation by LIFT technique, and/or seton placement. Benefits & alternatives discussed. I noted a good likelihood this will help address the problem, but sometimes repeat operations and prolonged healing times may occur. Risks such as bleeding, pain, recurrence, reoperation, incontinence, heart attack, death, and other risks were discussed.  Educational handouts further explaining the pathology, treatment options, and bowel regimen were given. The patient expressed understanding & wishes to proceed. We will work to coordinate surgery for a mutually convenient time.   PROLAPSED INTERNAL HEMORRHOIDS, GRADE 2 (K64.1)  Current Plans Pt Education - [facility] Hemorrhoids (Hymen Arnett): discussed with patient and provided information.  ENCOUNTER FOR PREOPERATIVE EXAMINATION FOR GENERAL SURGICAL PROCEDURE (Z01.818)  Current Plans You are being scheduled for surgery- Our  schedulers will call you.  You should hear from our office's scheduling department within 5 working days about the location, date, and time of surgery. We try to make accommodations for patient's preferences in scheduling surgery, but sometimes the OR schedule or the surgeon's schedule prevents [place] from making those accommodations.  If you have not heard from our office [phone]) in 5 working days, call the office and ask for your surgeon's nurse.  If you have other questions about your diagnosis, plan, or surgery, call the office and ask for your surgeon's nurse.  The anatomy and the physiology was discussed. The pathophysiology and natural history of the disease was discussed. Options were discussed and recommendations were made. Technique, risks, benefits, & alternatives were discussed. Risks such as stroke, heart attack, bleeding, indection, death, and other risks discussed. Questions answered. The patient agrees to proceed. Pt Education - [facility] Rectal Prep for Anorectal outpatient/office surgery: discussed with patient and provided information. Pt Education - [facility] Rectal Surgery HCI (Raymone Pembroke): discussed with patient and provided information. Pt Education - [facility] Good Bowel Health (Constance Hackenberg)  Adin Hector, MD, FACS, MASCRS Gastrointestinal and Minimally Invasive Surgery    [address], [place], [place] [address] [phone] Main / Paging [phone] Fax

## 2018-12-02 ENCOUNTER — Other Ambulatory Visit: Payer: Self-pay | Admitting: Primary Care

## 2018-12-08 ENCOUNTER — Telehealth: Payer: Self-pay | Admitting: Primary Care

## 2018-12-08 NOTE — Telephone Encounter (Signed)
Spoke with patient and reviewed hold/boost plan.  Also, Dr. Johney Maine would like for patient to still have INR checked here in our office as planned on 7/23 and for her to bring a copy of her level withher to procedure appointment that day.  Patient driving and couldn't write down boost instructions today but does understand when to stop her coumadin.  I told her we would give her a printout with the instructions on it when she comes in to INR check on 7/23 so that she is aware of how to recheck.  She is concerned she may be in too much pain to return for INR check on 7/28. However, she will call me to discuss on Monday following procedure if this is the case.  FYI to Villa Herb, RN coumadin clinic nurse who will see her on 7/23.   Thanks

## 2018-12-08 NOTE — Telephone Encounter (Signed)
Agree with proposed plan.

## 2018-12-08 NOTE — Telephone Encounter (Signed)
Pt requesting a call at [phone]. She has surgery scheduled on 7/23 and wants guidance on when to stop coumadin.

## 2018-12-08 NOTE — Telephone Encounter (Addendum)
Theresa Lewis called me back from Dr. Clyda Greener office.  Patient is scheduled to have a fistula repair with possible hemorrhoidectomy.  They would prefer a hold if patient can tolerate and defer to [place] to discuss hold and potential lovenox bridge needs.    Patient is currently on coumadin therapy for history of DVT, PE and antiphospholipid antibody syndrome.  She has no recent hx of embolism and no CVA or prosthetic valve history.  Typically okay for 5 day hold without Lovenox.   Propose the following:  7/18 - Last dose of Coumadin 7/19 - 7/23: HOLD COUMADIN 7/23 -  PROCEDURE - no coumadin 7/24 - Restart coumadin taking 15mg  (3 tabs) 7/25 - Coumadin 15mg  (3 tabs) 7/26 - Coumadin 7.5mg  (1.5 tabs) 7/27 - Coumadin 7.5mg  (1.5 tabs) 7/28 - RECHECK INR in coumadin clinic No Lovenox bridge  Note: patients routine dosing now is 10mg  M, W, Fri,/ 5mg  S, T, Th/ 7.5mg  on Saturdays.  Theresa Bossier, NP please advise if okay with above proposed plan.   Thanks.

## 2018-12-08 NOTE — Telephone Encounter (Signed)
Patient is scheduled to have a rectal tear repair surgical procedure on 12/16/18.  I spoke with Dr. Annett Fabian office to discuss further regarding what going to be done and recommended in terms of a hold.    They are discussing with Dr. Johney Maine and will call me back or forward recommendations based on what they will be doing.   Thanks to all involved in communicating.

## 2018-12-16 ENCOUNTER — Other Ambulatory Visit: Payer: Self-pay

## 2018-12-16 ENCOUNTER — Ambulatory Visit (INDEPENDENT_AMBULATORY_CARE_PROVIDER_SITE_OTHER): Payer: BC Managed Care – PPO | Admitting: General Practice

## 2018-12-16 ENCOUNTER — Other Ambulatory Visit: Payer: Self-pay | Admitting: Surgery

## 2018-12-16 DIAGNOSIS — Z7901 Long term (current) use of anticoagulants: Secondary | ICD-10-CM | POA: Diagnosis not present

## 2018-12-16 LAB — POCT INR: INR: 1.3 — AB (ref 2.0–3.0)

## 2018-12-16 NOTE — Patient Instructions (Addendum)
Pre visit review using our clinic review tool, if applicable. No additional management support is needed unless otherwise documented below in the visit note.  Continue to take 10mg  daily EXCEPT for 5mg  on Sun, Tue, and Thursday and 7.5 mg on Saturdays.  (Please follow pt instructions for re-starting coumadin after procedure on 7/23).  7/18 - Last dose of Coumadin 7/19 - 7/23: HOLD COUMADIN 7/23 -  PROCEDURE - no coumadin 7/24 - Restart coumadin taking 15mg  (3 tabs) 7/25 - Coumadin 15mg  (3 tabs) 7/26 - Coumadin 7.5mg  (1.5 tabs) 7/27 - Coumadin 7.5mg  (1.5 tabs) 7/28 - RECHECK INR in coumadin clinic No Lovenox bridge

## 2018-12-21 ENCOUNTER — Ambulatory Visit: Payer: BC Managed Care – PPO

## 2018-12-21 ENCOUNTER — Telehealth: Payer: Self-pay

## 2018-12-21 ENCOUNTER — Ambulatory Visit (INDEPENDENT_AMBULATORY_CARE_PROVIDER_SITE_OTHER): Payer: BC Managed Care – PPO

## 2018-12-21 DIAGNOSIS — Z7901 Long term (current) use of anticoagulants: Secondary | ICD-10-CM | POA: Diagnosis not present

## 2018-12-21 LAB — POCT INR: INR: 2.1 (ref 2.0–3.0)

## 2018-12-21 NOTE — Telephone Encounter (Signed)
[facility] Night - Client Nonclinical Telephone Record AccessNurse Client [place] Night - Client Client Site [place] Physician Alma Friendly - NP Contact Type Call Who Is Calling Patient / Member / Family / Caregiver Caller Name [place] Phone Number [phone] Patient Name [name] Patient DOB 11/16/1960 Call Type Message Only Information Provided Reason for Call Request for General Office Information Initial Comment Caller is needing to verify appt date and time. Additional Comment Call Closed By: Silvano Rusk Transaction Date/Time: 12/21/2018 7:21:45 AM (ET

## 2018-12-21 NOTE — Telephone Encounter (Signed)
I spoke with pt and she will come for coumadin appt today at 12:30; pt will call when she gets here and pt will pull up to front door for [facility] to come out to car for coumadin ck. Pt appreciative. FYI to [employer].

## 2018-12-21 NOTE — Patient Instructions (Signed)
INR today: 2.1  Patient has had a 4 day boost, will resume prior dosing schedule of taking 10mg  daily EXCEPT for 5mg  on Sun, Tue, and Thursday and 7.5 mg on Saturdays following surgical procedure on 7/23.  Patient to come for recheck in 1 week.

## 2018-12-29 ENCOUNTER — Ambulatory Visit (INDEPENDENT_AMBULATORY_CARE_PROVIDER_SITE_OTHER): Payer: BC Managed Care – PPO

## 2018-12-29 DIAGNOSIS — Z7901 Long term (current) use of anticoagulants: Secondary | ICD-10-CM | POA: Diagnosis not present

## 2018-12-29 LAB — POCT INR: INR: 4.6 — AB (ref 2.0–3.0)

## 2018-12-29 NOTE — Patient Instructions (Addendum)
  INR today: 4.6 * patient has been taking ibuprofen 600mg  4x daily with oxycodone for pain control following her surgery.  She has had extreme pain and cannot make it without the ibuprofen at this point.    After discussing this with her surgeon, he has insisted she stop the ibuprofen as it may also be causing urinary retention issues as well as causing issues with healing and bleeding risks.  Patient agrees after discussing with him.  Patient will stop ibuprofen immediately.   Patient is to hold her coumadin today and take 1/2 dose tomorrow (2.5mg ) and then resume prior dosing on Friday of 10mg  M, W, F, 7.5mg  on Sat and 5mg  the rest.  I will recheck her in 1 week per our original plan.    Patient verbalizes understanding of all instructions given today.   \

## 2019-01-05 ENCOUNTER — Ambulatory Visit (INDEPENDENT_AMBULATORY_CARE_PROVIDER_SITE_OTHER): Payer: BC Managed Care – PPO

## 2019-01-05 ENCOUNTER — Other Ambulatory Visit: Payer: Self-pay

## 2019-01-05 DIAGNOSIS — Z7901 Long term (current) use of anticoagulants: Secondary | ICD-10-CM | POA: Diagnosis not present

## 2019-01-05 LAB — POCT INR: INR: 4.9 — AB (ref 2.0–3.0)

## 2019-01-05 NOTE — Patient Instructions (Signed)
INR today: 4.9 * patient did stop the 4x daily 600mg  of Ibuprofen but had been doing this for at least 2 weeks.  Other than starting tamsulosin, patient reports no other changes in her medications.  It is likely that she still has the ibuprofen in her system contributing to the elevated reading.    Patient is to hold her coumadin today (8/12) and tomorrow (8/13) and then let me recheck her this Friday to ensure that she is dropping adequately before resuming prior dosing.  Patient verbalizes understanding. Patient denies any bruising or bleeding and other than some ongoing residual urinary retention (which she is addressing with her surgeon) she is healing well postop.

## 2019-01-10 ENCOUNTER — Ambulatory Visit (INDEPENDENT_AMBULATORY_CARE_PROVIDER_SITE_OTHER): Payer: BC Managed Care – PPO

## 2019-01-10 DIAGNOSIS — Z7901 Long term (current) use of anticoagulants: Secondary | ICD-10-CM | POA: Diagnosis not present

## 2019-01-10 LAB — POCT INR: INR: 2.3 (ref 2.0–3.0)

## 2019-01-10 NOTE — Patient Instructions (Signed)
INR today: 2.3  *Note, this is after a 2 day hold last week due to supratherapeutic readings related to her use of high doses of ibuprofen which she has now been off of for over 1 week.   Patient is to resume her prior dosing now of 10mg  M,W,F/ 5mg  on Sun, Tues, Thur and 7.5mg  on Saturdays with a recheck in 1.5 weeks.  Patient verbalizes understanding.

## 2019-01-18 ENCOUNTER — Telehealth: Payer: Self-pay | Admitting: *Deleted

## 2019-01-18 NOTE — Telephone Encounter (Signed)
Follow up with [facility], consult on 6/24, surgery on 7/23, post op appt 8/24.

## 2019-01-21 ENCOUNTER — Ambulatory Visit (INDEPENDENT_AMBULATORY_CARE_PROVIDER_SITE_OTHER): Payer: BC Managed Care – PPO

## 2019-01-21 DIAGNOSIS — Z7901 Long term (current) use of anticoagulants: Secondary | ICD-10-CM | POA: Diagnosis not present

## 2019-01-21 LAB — POCT INR: INR: 5.2 — AB (ref 2.0–3.0)

## 2019-01-21 NOTE — Patient Instructions (Signed)
INR today: 5.2    Patient is to HOLD coumadin today (8/28) and tomorrow (8/29) and then start decreased dosing of 7.5 mg daily EXCEPT for 5 mg on sun, Tues, Thursday.  Recheck in 1 week.   Patient denies any abnormal bleeding or bruising and knows to go to ER if any concerns develop.

## 2019-01-28 ENCOUNTER — Ambulatory Visit (INDEPENDENT_AMBULATORY_CARE_PROVIDER_SITE_OTHER): Payer: BC Managed Care – PPO

## 2019-01-28 DIAGNOSIS — Z7901 Long term (current) use of anticoagulants: Secondary | ICD-10-CM | POA: Diagnosis not present

## 2019-01-28 LAB — POCT INR: INR: 3.9 — AB (ref 2.0–3.0)

## 2019-01-28 NOTE — Patient Instructions (Signed)
INR today: 3.9 *patient states that she mixed up her 1.5 pill days and knows she took too many as she got confused.   Patient is to [place] today and then continue on previous reduced dosing of 7.5 mg daily EXCEPT for 5 mg on sun, Tues, Thursday.  Recheck in 2 weeks. Patient verbalizes understanding and was able to repeat back accurate dosing.   Patient denies any abnormal bleeding or bruising and knows to go to ER if any concerns develop.

## 2019-02-10 ENCOUNTER — Ambulatory Visit (INDEPENDENT_AMBULATORY_CARE_PROVIDER_SITE_OTHER): Payer: BC Managed Care – PPO

## 2019-02-10 ENCOUNTER — Ambulatory Visit (INDEPENDENT_AMBULATORY_CARE_PROVIDER_SITE_OTHER): Payer: BC Managed Care – PPO | Admitting: General Practice

## 2019-02-10 DIAGNOSIS — Z7901 Long term (current) use of anticoagulants: Secondary | ICD-10-CM | POA: Diagnosis not present

## 2019-02-10 DIAGNOSIS — Z23 Encounter for immunization: Secondary | ICD-10-CM | POA: Diagnosis not present

## 2019-02-10 LAB — POCT INR: INR: 4.4 — AB (ref 2.0–3.0)

## 2019-02-10 NOTE — Patient Instructions (Addendum)
Pre visit review using our clinic review tool, if applicable. No additional management support is needed unless otherwise documented below in the visit note.  Hold dosage today (9/17) and then take 5 mg daily except 7.5 mg on Mon Friday and Saturday.  Re-check in 2 weeks.

## 2019-02-24 ENCOUNTER — Ambulatory Visit (INDEPENDENT_AMBULATORY_CARE_PROVIDER_SITE_OTHER): Payer: BC Managed Care – PPO | Admitting: General Practice

## 2019-02-24 DIAGNOSIS — Z7901 Long term (current) use of anticoagulants: Secondary | ICD-10-CM | POA: Diagnosis not present

## 2019-02-24 LAB — POCT INR: INR: 3.2 — AB (ref 2.0–3.0)

## 2019-02-24 NOTE — Patient Instructions (Signed)
Pre visit review using our clinic review tool, if applicable. No additional management support is needed unless otherwise documented below in the visit note.  Continue to take 5 mg daily except 7.5 mg on Mon Friday and Saturday.  Re-check in 4 weeks.

## 2019-03-24 ENCOUNTER — Ambulatory Visit: Payer: BC Managed Care – PPO

## 2019-04-07 ENCOUNTER — Ambulatory Visit: Payer: BC Managed Care – PPO | Admitting: General Practice

## 2019-04-07 ENCOUNTER — Other Ambulatory Visit: Payer: Self-pay

## 2019-04-07 DIAGNOSIS — Z7901 Long term (current) use of anticoagulants: Secondary | ICD-10-CM

## 2019-04-07 LAB — POCT INR: INR: 1.4 — AB (ref 2.0–3.0)

## 2019-04-07 NOTE — Patient Instructions (Signed)
Pre visit review using our clinic review tool, if applicable. No additional management support is needed unless otherwise documented below in the visit note.  Take 1 1/2 tablets today, and take 2 tablets on Friday and Saturday.  On Sunday continue to take  5 mg daily except 7.5 mg on Mon Friday and Saturday.  Re-check in 1 week.

## 2019-04-10 ENCOUNTER — Other Ambulatory Visit: Payer: Self-pay | Admitting: Primary Care

## 2019-04-10 DIAGNOSIS — D6869 Other thrombophilia: Secondary | ICD-10-CM

## 2019-04-10 DIAGNOSIS — Z86718 Personal history of other venous thrombosis and embolism: Secondary | ICD-10-CM

## 2019-04-14 ENCOUNTER — Ambulatory Visit: Payer: BC Managed Care – PPO | Admitting: General Practice

## 2019-04-14 ENCOUNTER — Other Ambulatory Visit: Payer: Self-pay

## 2019-04-14 DIAGNOSIS — Z7901 Long term (current) use of anticoagulants: Secondary | ICD-10-CM | POA: Diagnosis not present

## 2019-04-14 LAB — POCT INR: INR: 1.9 — AB (ref 2.0–3.0)

## 2019-04-14 NOTE — Patient Instructions (Addendum)
Pre visit review using our clinic review tool, if applicable. No additional management support is needed unless otherwise documented below in the visit note.  Take 1 1/2 tablets today, and take 2 tablets on Friday and Saturday.  On Sunday change dosage and take  5 mg daily except 7.5 mg on Mon Wed Friday and Saturday.  Re-check in 2 weeks.

## 2019-04-28 ENCOUNTER — Ambulatory Visit (INDEPENDENT_AMBULATORY_CARE_PROVIDER_SITE_OTHER): Payer: BC Managed Care – PPO | Admitting: General Practice

## 2019-04-28 ENCOUNTER — Other Ambulatory Visit: Payer: Self-pay

## 2019-04-28 DIAGNOSIS — Z7901 Long term (current) use of anticoagulants: Secondary | ICD-10-CM | POA: Diagnosis not present

## 2019-04-28 LAB — POCT INR: INR: 2.8 (ref 2.0–3.0)

## 2019-04-28 NOTE — Patient Instructions (Addendum)
Pre visit review using our clinic review tool, if applicable. No additional management support is needed unless otherwise documented below in the visit note.  Continue to take  5 mg daily except 7.5 mg on Mon Wed Friday and Saturday.  Re-check in 4 weeks.

## 2019-06-02 ENCOUNTER — Other Ambulatory Visit: Payer: Self-pay

## 2019-06-02 ENCOUNTER — Ambulatory Visit: Payer: BC Managed Care – PPO | Admitting: General Practice

## 2019-06-02 DIAGNOSIS — Z7901 Long term (current) use of anticoagulants: Secondary | ICD-10-CM | POA: Diagnosis not present

## 2019-06-02 LAB — POCT INR: INR: 2.1 (ref 2.0–3.0)

## 2019-06-02 NOTE — Patient Instructions (Addendum)
Pre visit review using our clinic review tool, if applicable. No additional management support is needed unless otherwise documented below in the visit note.  Take extra 1/2 tablet today and tomorrow and then continue to take  5 mg daily except 7.5 mg on Mon Wed Friday and Saturday.  Re-check in 3 weeks.

## 2019-06-23 ENCOUNTER — Ambulatory Visit: Payer: BC Managed Care – PPO | Admitting: General Practice

## 2019-06-23 ENCOUNTER — Other Ambulatory Visit: Payer: Self-pay

## 2019-06-23 DIAGNOSIS — Z7901 Long term (current) use of anticoagulants: Secondary | ICD-10-CM

## 2019-06-23 LAB — POCT INR: INR: 1.9 — AB (ref 2.0–3.0)

## 2019-06-23 NOTE — Patient Instructions (Addendum)
Pre visit review using our clinic review tool, if applicable. No additional management support is needed unless otherwise documented below in the visit note.  Take 1 1/2 tablets today and take 2 tablets tomorrow and then change dosage and take 1 1/2 tablets (7.5 mg) daily except take 1 tablet (5 mg) on Sundays only.  Re-check in 2 weeks.

## 2019-06-26 ENCOUNTER — Other Ambulatory Visit: Payer: Self-pay | Admitting: Primary Care

## 2019-07-05 ENCOUNTER — Ambulatory Visit: Payer: BC Managed Care – PPO

## 2019-07-07 ENCOUNTER — Other Ambulatory Visit: Payer: Self-pay

## 2019-07-07 ENCOUNTER — Ambulatory Visit: Payer: BC Managed Care – PPO

## 2019-07-07 DIAGNOSIS — Z7901 Long term (current) use of anticoagulants: Secondary | ICD-10-CM

## 2019-07-07 LAB — POCT INR: INR: 2.3 (ref 2.0–3.0)

## 2019-07-07 MED ORDER — OMEPRAZOLE 20 MG PO CPDR: 20.0000 mg | DELAYED_RELEASE_CAPSULE | Freq: Every day | ORAL | 0 refills | Status: DC

## 2019-07-07 NOTE — Patient Instructions (Addendum)
Pre visit review using our clinic review tool, if applicable. No additional management support is needed unless otherwise documented below in the visit note.  Take 2 tablets today then change weekly dosing to  7.5mg  daily and return in 3 wks.

## 2019-07-07 NOTE — Progress Notes (Signed)
Pt requested refill of omeprazole. Advised pt needs an apt with PCP. Made apt for PCP same day as coumadin clinic apt.  Sent in refill for 30 days

## 2019-07-28 ENCOUNTER — Other Ambulatory Visit: Payer: Self-pay

## 2019-07-28 ENCOUNTER — Ambulatory Visit: Payer: BC Managed Care – PPO

## 2019-07-28 ENCOUNTER — Ambulatory Visit (INDEPENDENT_AMBULATORY_CARE_PROVIDER_SITE_OTHER): Payer: BC Managed Care – PPO | Admitting: Primary Care

## 2019-07-28 VITALS — BP 118/70 | HR 60 | Temp 96.9°F | Ht 65.0 in | Wt 213.0 lb

## 2019-07-28 DIAGNOSIS — G500 Trigeminal neuralgia: Secondary | ICD-10-CM | POA: Diagnosis not present

## 2019-07-28 DIAGNOSIS — R7303 Prediabetes: Secondary | ICD-10-CM | POA: Diagnosis not present

## 2019-07-28 DIAGNOSIS — Z1231 Encounter for screening mammogram for malignant neoplasm of breast: Secondary | ICD-10-CM

## 2019-07-28 DIAGNOSIS — D6861 Antiphospholipid syndrome: Secondary | ICD-10-CM | POA: Diagnosis not present

## 2019-07-28 DIAGNOSIS — Z0000 Encounter for general adult medical examination without abnormal findings: Secondary | ICD-10-CM

## 2019-07-28 DIAGNOSIS — Z7901 Long term (current) use of anticoagulants: Secondary | ICD-10-CM | POA: Diagnosis not present

## 2019-07-28 DIAGNOSIS — K219 Gastro-esophageal reflux disease without esophagitis: Secondary | ICD-10-CM | POA: Diagnosis not present

## 2019-07-28 DIAGNOSIS — K648 Other hemorrhoids: Secondary | ICD-10-CM

## 2019-07-28 LAB — POCT INR: INR: 2.1 (ref 2.0–3.0)

## 2019-07-28 LAB — COMPREHENSIVE METABOLIC PANEL
ALT: 14 U/L (ref 0–35)
AST: 14 U/L (ref 0–37)
Albumin: 3.7 g/dL (ref 3.5–5.2)
Alkaline Phosphatase: 82 U/L (ref 39–117)
BUN: 15 mg/dL (ref 6–23)
CO2: 28 mEq/L (ref 19–32)
Calcium: 9.2 mg/dL (ref 8.4–10.5)
Chloride: 106 mEq/L (ref 96–112)
Creatinine, Ser: 0.93 mg/dL (ref 0.40–1.20)
GFR: 74.46 mL/min (ref >60.00)
Glucose, Bld: 83 mg/dL (ref 70–99)
Potassium: 4.2 mEq/L (ref 3.5–5.1)
Sodium: 139 mEq/L (ref 135–145)
Total Bilirubin: 0.5 mg/dL (ref 0.2–1.2)
Total Protein: 6.9 g/dL (ref 6.0–8.3)

## 2019-07-28 LAB — LIPID PANEL
Cholesterol: 181 mg/dL (ref 0–200)
HDL: 52.9 mg/dL (ref >39.00)
LDL Cholesterol: 108 mg/dL — ABNORMAL HIGH (ref 0–99)
NonHDL: 128.52
Total CHOL/HDL Ratio: 3
Triglycerides: 105 mg/dL (ref 0.0–149.0)
VLDL: 21 mg/dL (ref 0.0–40.0)

## 2019-07-28 LAB — HEMOGLOBIN A1C: Hgb A1c MFr Bld: 5.8 % (ref 4.6–6.5)

## 2019-07-28 MED ORDER — GABAPENTIN 100 MG PO CAPS: ORAL_CAPSULE | ORAL | 0 refills | Status: DC

## 2019-07-28 MED ORDER — OMEPRAZOLE 20 MG PO CPDR: DELAYED_RELEASE_CAPSULE | ORAL | 0 refills | Status: DC

## 2019-07-28 NOTE — Assessment & Plan Note (Signed)
Due to antiphospholipid syndrome. Continue warfarin. INR due today.

## 2019-07-28 NOTE — Patient Instructions (Addendum)
Pre visit review using our clinic review tool, if applicable. No additional management support is needed unless otherwise documented below in the visit note.  Take 2 tablets today and tomorrow then change weekly dosing to 7.5mg  daily except 10mg  on Wednesday  and return in 3 wks.

## 2019-07-28 NOTE — Assessment & Plan Note (Signed)
INR due today.  Continue warfarin.

## 2019-07-28 NOTE — Assessment & Plan Note (Signed)
Immunizations UTD. Due for Shingrix but will be getting Covid-19 vaccines soon.  Pap smear UTD, due in 2022. Mammogram due and ordered. Colonoscopy UTD, due in 2023.  Encouraged a healthy diet and regular exercise. Exam today benign. Labs pending.

## 2019-07-28 NOTE — Assessment & Plan Note (Signed)
Repeat A1C pending.  Discussed the importance of a healthy diet and regular exercise in order for weight loss, and to reduce the risk of any potential medical problems.  

## 2019-07-28 NOTE — Assessment & Plan Note (Signed)
Surgical intervention in September 2020. Doing well now. Continue Miralax several times weekly. Colonoscopy UTD.

## 2019-07-28 NOTE — Patient Instructions (Addendum)
Stop by the lab prior to leaving today. I will notify you of your results once received.   Call the breast center to schedule your mammogram.  Start exercising. You should be getting 150 minutes of moderate intensity exercise weekly.  Continue to work on a healthy diet. Ensure you are consuming 64 ounces of water daily.  It was a pleasure to see you today!   Preventive Care 60-60 Years Old, Female Preventive care refers to visits with your health care provider and lifestyle choices that can promote health and wellness. This includes:  A yearly physical exam. This may also be called an annual well check.  Regular dental visits and eye exams.  Immunizations.  Screening for certain conditions.  Healthy lifestyle choices, such as eating a healthy diet, getting regular exercise, not using drugs or products that contain nicotine and tobacco, and limiting alcohol use. What can I expect for my preventive care visit? Physical exam Your health care provider will check your:  Height and weight. This may be used to calculate body mass index (BMI), which tells if you are at a healthy weight.  Heart rate and blood pressure.  Skin for abnormal spots. Counseling Your health care provider may ask you questions about your:  Alcohol, tobacco, and drug use.  Emotional well-being.  Home and relationship well-being.  Sexual activity.  Eating habits.  Work and work Statistician.  Method of birth control.  Menstrual cycle.  Pregnancy history. What immunizations do I need?  Influenza (flu) vaccine  This is recommended every year. Tetanus, diphtheria, and pertussis (Tdap) vaccine  You may need a Td booster every 10 years. Varicella (chickenpox) vaccine  You may need this if you have not been vaccinated. Zoster (shingles) vaccine  You may need this after age 47. Measles, mumps, and rubella (MMR) vaccine  You may need at least one dose of MMR if you were born in 1957 or later.  You may also need a second dose. Pneumococcal conjugate (PCV13) vaccine  You may need this if you have certain conditions and were not previously vaccinated. Pneumococcal polysaccharide (PPSV23) vaccine  You may need one or two doses if you smoke cigarettes or if you have certain conditions. Meningococcal conjugate (MenACWY) vaccine  You may need this if you have certain conditions. Hepatitis A vaccine  You may need this if you have certain conditions or if you travel or work in places where you may be exposed to hepatitis A. Hepatitis B vaccine  You may need this if you have certain conditions or if you travel or work in places where you may be exposed to hepatitis B. Haemophilus influenzae type b (Hib) vaccine  You may need this if you have certain conditions. Human papillomavirus (HPV) vaccine  If recommended by your health care provider, you may need three doses over 6 months. You may receive vaccines as individual doses or as more than one vaccine together in one shot (combination vaccines). Talk with your health care provider about the risks and benefits of combination vaccines. What tests do I need? Blood tests  Lipid and cholesterol levels. These may be checked every 5 years, or more frequently if you are over 46 years old.  Hepatitis C test.  Hepatitis B test. Screening  Lung cancer screening. You may have this screening every year starting at age 35 if you have a 30-pack-year history of smoking and currently smoke or have quit within the past 15 years.  Colorectal cancer screening. All adults should have  this screening starting at age 43 and continuing until age 60. Your health care provider may recommend screening at age 63 if you are at increased risk. You will have tests every 1-10 years, depending on your results and the type of screening test.  Diabetes screening. This is done by checking your blood sugar (glucose) after you have not eaten for a while (fasting).  You may have this done every 1-3 years.  Mammogram. This may be done every 1-2 years. Talk with your health care provider about when you should start having regular mammograms. This may depend on whether you have a family history of breast cancer.  BRCA-related cancer screening. This may be done if you have a family history of breast, ovarian, tubal, or peritoneal cancers.  Pelvic exam and Pap test. This may be done every 3 years starting at age 15. Starting at age 85, this may be done every 5 years if you have a Pap test in combination with an HPV test. Other tests  Sexually transmitted disease (STD) testing.  Bone density scan. This is done to screen for osteoporosis. You may have this scan if you are at high risk for osteoporosis. Follow these instructions at home: Eating and drinking  Eat a diet that includes fresh fruits and vegetables, whole grains, lean protein, and low-fat dairy.  Take vitamin and mineral supplements as recommended by your health care provider.  Do not drink alcohol if: ? Your health care provider tells you not to drink. ? You are pregnant, may be pregnant, or are planning to become pregnant.  If you drink alcohol: ? Limit how much you have to 0-1 drink a day. ? Be aware of how much alcohol is in your drink. In the [place] one drink equals one 12 oz bottle of beer (355 mL), one 5 oz glass of wine (148 mL), or one 1 oz glass of hard liquor (44 mL). Lifestyle  Take daily care of your teeth and gums.  Stay active. Exercise for at least 30 minutes on 5 or more days each week.  Do not use any products that contain nicotine or tobacco, such as cigarettes, e-cigarettes, and chewing tobacco. If you need help quitting, ask your health care provider.  If you are sexually active, practice safe sex. Use a condom or other form of birth control (contraception) in order to prevent pregnancy and STIs (sexually transmitted infections).  If told by your health care provider,  take low-dose aspirin daily starting at age 8. What's next?  Visit your health care provider once a year for a well check visit.  Ask your health care provider how often you should have your eyes and teeth checked.  Stay up to date on all vaccines. This information is not intended to replace advice given to you by your health care provider. Make sure you discuss any questions you have with your health care provider. Document Revised: 01/21/2018 Document Reviewed: 01/21/2018 Elsevier Patient Education  2020 [employer].

## 2019-07-28 NOTE — Assessment & Plan Note (Signed)
She is working on weaning off of gabapentin 100 mg and is taking once daily twice weekly. Goal is to wean off completely. Will provide refill so she has a supply for an acute episode.

## 2019-07-28 NOTE — Assessment & Plan Note (Signed)
Doing well on omeprazole 20 mg, is trying to wean down to three times weekly. Continue to wean off. Continue to monitor.

## 2019-07-28 NOTE — Progress Notes (Signed)
Subjective:    Patient ID: Theresa Lewis, female    DOB: [birth year], 59 y.o.   MRN: [id number]  HPI  This visit occurred during the SARS-CoV-2 public health emergency.  Safety protocols were in place, including screening questions prior to the visit, additional usage of staff PPE, and extensive cleaning of exam room while observing appropriate contact time as indicated for disinfecting solutions.   Theresa Lewis is a 59 year old female with a history of internal bleeding hemorrhoids, GERD, dysphagia, antiphospholipid antibody syndrome managed on warfarin, prediabetes, trigeminal neuralgia who presents today for medication refill and follow up.  Since her last visit with me she's had hemorrhoid surgery for internal hemorrhoids that were bleeding, this was done in September 2020. Colonoscopy completed in June 2020, due again in 2023.  Immunizations: -Tetanus: Completed in 2014 -Influenza: Completed this season  -Shingles: Never completed  Diet: She endorses a healthy diet. Exercise: She is not exercising  Eye exam: Completed in 2019, she will schedule  Dental exam: No recent exam  Pap Smear: Completed in 2019 Mammogram: Due Colonoscopy: Completed in 2020   BP Readings from Last 3 Encounters:  07/28/19 118/70  10/28/18 116/69  04/08/18 126/88     Review of Systems  Constitutional: Negative for unexpected weight change.  HENT: Negative for rhinorrhea.   Respiratory: Negative for cough and shortness of breath.   Cardiovascular: Negative for chest pain.  Gastrointestinal: Negative for constipation and diarrhea.  Genitourinary: Negative for difficulty urinating.  Musculoskeletal: Negative for arthralgias.  Skin: Negative for rash.  Allergic/Immunologic: Negative for environmental allergies.  Neurological: Negative for dizziness, numbness and headaches.  Psychiatric/Behavioral: The patient is not nervous/anxious.        Past Medical History:  Diagnosis Date  . ABNORMAL  VAGINAL BLEEDING 04/19/2007   Qualifier: Diagnosis of  By: Hulan Saas, CMA (AAMA), [place]   . Deep venous thrombosis ([place])    2006    3 x clotting   . HEMORRHOIDS, WITH BLEEDING 12/26/2008   Qualifier: Diagnosis of  By: Regis Bill MD, Standley Brooking   . PE (pulmonary embolism) 07/09/2010  . Pulmonary embolism [facility])      Social History   Socioeconomic History  . Marital status: Married    Spouse name: Not on file  . Number of children: 1  . Years of education: Not on file  . Highest education level: Not on file  Occupational History  . Occupation: student advocate   Tobacco Use  . Smoking status: Never Smoker  . Smokeless tobacco: Never Used  Substance and Sexual Activity  . Alcohol use: Yes    Alcohol/week: 0.0 standard drinks    Comment: rarely  . Drug use: No  . Sexual activity: Not on file  Other Topics Concern  . Not on file  Social History Narrative   HH of 3  Daughter 54 and self.  hh of 2    No pets.   No ets.   No etoh.    Working for [employer] and [employer] .  45- 50 hours per week.                Social Determinants of Health   Financial Resource Strain:   . Difficulty of Paying Living Expenses: Not on file  Food Insecurity:   . Worried About Charity fundraiser in the Last Year: Not on file  . Ran Out of Food in the Last Year: Not on file  Transportation Needs:   . Lack  of Transportation (Medical): Not on file  . Lack of Transportation (Non-Medical): Not on file  Physical Activity:   . Days of Exercise per Week: Not on file  . Minutes of Exercise per Session: Not on file  Stress:   . Feeling of Stress : Not on file  Social Connections:   . Frequency of Communication with Friends and Family: Not on file  . Frequency of Social Gatherings with Friends and Family: Not on file  . Attends Religious Services: Not on file  . Active Member of Clubs or Organizations: Not on file  . Attends Archivist Meetings: Not on file  . Marital Status: Not on file    Intimate Partner Violence:   . Fear of Current or Ex-Partner: Not on file  . Emotionally Abused: Not on file  . Physically Abused: Not on file  . Sexually Abused: Not on file    Past Surgical History:  Procedure Laterality Date  . MYOMECTOMY    . TUBAL LIGATION    . vena caval filter      Family History  Problem Relation Age of Onset  . Lung cancer Father 73       april 12   . Pancreatic cancer Father 54  . Hypertension Mother   . Rheum arthritis Mother   . Diabetes Paternal Aunt        x2  . Colon cancer Neg Hx   . Colon polyps Neg Hx   . Kidney disease Neg Hx   . Esophageal cancer Neg Hx   . Gallbladder disease Neg Hx   . Rectal cancer Neg Hx   . Stomach cancer Neg Hx     Allergies  Allergen Reactions  . Clindamycin/Lincomycin     "unknown"  . Tape Other (See Comments)    When pt has a band-aid on, the impression of band-aid stays on for a month    Current Outpatient Medications on File Prior to Visit  Medication Sig Dispense Refill  . AMBULATORY NON FORMULARY MEDICATION Medication Name: Nitroglycerin 0.125 mg apply rectally using a gloved finger up until the first knuckle three times a day for 8 weeks. 1 Tube 1  . Biotin 5 MG TABS Take by mouth daily.    . [facility] STOOL SOFTENER 100 MG capsule TAKE 1 CAPSULE BY MOUTH AT BEDTIME AS NEEDED 30 capsule 0  . warfarin (COUMADIN) 5 MG tablet Take 1 tablet daily except take 1 1/2 tablets on Mon Wed and Fri or TAKE AS DIRECTED BY ANTICOAGULATION CLINIC 120 tablet 1   No current facility-administered medications on file prior to visit.    BP 118/70   Pulse 60   Temp (!) 96.9 F (36.1 C) (Temporal)   Ht 5\' 5"  (1.651 m)   Wt 213 lb (96.6 kg)   SpO2 97%   BMI 35.45 kg/m    Objective:   Physical Exam  Constitutional: She is oriented to person, place, and time. She appears well-nourished.  HENT:  Right Ear: Tympanic membrane and ear canal normal.  Left Ear: Tympanic membrane and ear canal normal.  Mouth/Throat:  Oropharynx is clear and moist.  Eyes: Pupils are equal, round, and reactive to light. EOM are normal.  Cardiovascular: Normal rate and regular rhythm.  Respiratory: Effort normal and breath sounds normal.  GI: Soft. Bowel sounds are normal. There is no abdominal tenderness.  Musculoskeletal:        General: Normal range of motion.     Cervical back: Neck supple.  Neurological: She is alert and oriented to person, place, and time. No cranial nerve deficit.  Reflex Scores:      Patellar reflexes are 2+ on the right side and 2+ on the left side. Skin: Skin is warm and dry.  Psychiatric: She has a normal mood and affect.           Assessment & Plan:

## 2019-08-02 ENCOUNTER — Other Ambulatory Visit: Payer: Self-pay | Admitting: Primary Care

## 2019-08-02 DIAGNOSIS — K219 Gastro-esophageal reflux disease without esophagitis: Secondary | ICD-10-CM

## 2019-08-18 ENCOUNTER — Other Ambulatory Visit: Payer: Self-pay

## 2019-08-18 ENCOUNTER — Ambulatory Visit: Payer: BC Managed Care – PPO

## 2019-08-18 DIAGNOSIS — Z7901 Long term (current) use of anticoagulants: Secondary | ICD-10-CM

## 2019-08-18 LAB — POCT INR: INR: 2.9 (ref 2.0–3.0)

## 2019-08-18 NOTE — Patient Instructions (Addendum)
Pre visit review using our clinic review tool, if applicable. No additional management support is needed unless otherwise documented below in the visit note.  Continue 7.5mg daily except take 10mg on Wednesday. Recheck in 4 wks.  

## 2019-09-15 ENCOUNTER — Ambulatory Visit: Payer: BC Managed Care – PPO

## 2019-09-22 ENCOUNTER — Other Ambulatory Visit: Payer: Self-pay

## 2019-09-22 ENCOUNTER — Ambulatory Visit: Payer: BC Managed Care – PPO

## 2019-09-22 DIAGNOSIS — Z7901 Long term (current) use of anticoagulants: Secondary | ICD-10-CM | POA: Diagnosis not present

## 2019-09-22 LAB — POCT INR: INR: 2.8 (ref 2.0–3.0)

## 2019-09-22 NOTE — Patient Instructions (Addendum)
Pre visit review using our clinic review tool, if applicable. No additional management support is needed unless otherwise documented below in the visit note.  Continue 7.5mg daily except take 10mg on Wednesday. Recheck in 4 wks.  

## 2019-10-09 ENCOUNTER — Other Ambulatory Visit: Payer: Self-pay | Admitting: Primary Care

## 2019-10-09 DIAGNOSIS — D6869 Other thrombophilia: Secondary | ICD-10-CM

## 2019-10-09 DIAGNOSIS — Z86718 Personal history of other venous thrombosis and embolism: Secondary | ICD-10-CM

## 2019-10-10 NOTE — Telephone Encounter (Signed)
Pt is compliant with coumadin management. Sent in refill

## 2019-10-20 ENCOUNTER — Ambulatory Visit: Payer: BC Managed Care – PPO

## 2019-10-27 ENCOUNTER — Other Ambulatory Visit: Payer: Self-pay

## 2019-10-27 ENCOUNTER — Ambulatory Visit: Payer: BC Managed Care – PPO

## 2019-10-27 DIAGNOSIS — Z7901 Long term (current) use of anticoagulants: Secondary | ICD-10-CM

## 2019-10-27 LAB — POCT INR: INR: 2.6 (ref 2.0–3.0)

## 2019-10-27 NOTE — Patient Instructions (Addendum)
Pre visit review using our clinic review tool, if applicable. No additional management support is needed unless otherwise documented below in the visit note.  Continue 7.5mg daily except take 10mg on Wednesday. Recheck in 5 wks.  

## 2019-11-04 ENCOUNTER — Encounter: Payer: Self-pay | Admitting: Primary Care

## 2019-11-04 ENCOUNTER — Other Ambulatory Visit: Payer: Self-pay

## 2019-11-04 ENCOUNTER — Ambulatory Visit: Payer: BC Managed Care – PPO | Admitting: Primary Care

## 2019-11-04 DIAGNOSIS — M79609 Pain in unspecified limb: Secondary | ICD-10-CM | POA: Diagnosis not present

## 2019-11-04 HISTORY — DX: Pain in unspecified limb: M79.609

## 2019-11-04 NOTE — Progress Notes (Signed)
Subjective:    Patient ID: Theresa Lewis, female    DOB: 21-Nov-1960, 59 y.o.   MRN: [id number]  HPI  This visit occurred during the SARS-CoV-2 public health emergency.  Safety protocols were in place, including screening questions prior to the visit, additional usage of staff PPE, and extensive cleaning of exam room while observing appropriate contact time as indicated for disinfecting solutions.   Theresa Lewis is a 59 year old female with a history of trigeminal neuralgia, antiphospholipid antibody syndrome, joint swelling, prediabetes who presents today with a chief complaint of extremity pain.  Her pain is located to the left upper extremity that begin to the left side of the neck with radiation down to the first through third digits. Her recent symptoms began three days ago and have been intermittent since.   She describes her symptoms as a "dull ache". She denies injury/trauma, weakness, posterior neck pain, numbness, changes in speech, blurred vision. She did have a few headaches this past week, minor overall.  .  She's taken Tylenol with temporary improvement. She's been able to type at work without difficulty.   Review of Systems  Musculoskeletal: Positive for myalgias and neck pain.  Skin: Negative for color change.  Neurological: Negative for weakness and numbness.       Past Medical History:  Diagnosis Date   ABNORMAL VAGINAL BLEEDING 04/19/2007   Qualifier: Diagnosis of  By: Hulan Saas, CMA (AAMA), [place] S    Deep venous thrombosis ([place])    2006    3 x clotting    HEMORRHOIDS, WITH BLEEDING 12/26/2008   Qualifier: Diagnosis of  By: Regis Bill MD, Standley Brooking    PE (pulmonary embolism) 07/09/2010   Pulmonary embolism ([facility])      Social History   Socioeconomic History   Marital status: Married    Spouse name: Not on file   Number of children: 1   Years of education: Not on file   Highest education level: Not on file  Occupational History   Occupation: student  advocate   Tobacco Use   Smoking status: Never Smoker   Smokeless tobacco: Never Used  Substance and Sexual Activity   Alcohol use: Yes    Alcohol/week: 0.0 standard drinks    Comment: rarely   Drug use: No   Sexual activity: Not on file  Other Topics Concern   Not on file  Social History Narrative   HH of 3  Daughter 65 and self.  hh of 2    No pets.   No ets.   No etoh.    Working for [employer] and [employer] .  45- 50 hours per week.                Social Determinants of Health   Financial Resource Strain:    Difficulty of Paying Living Expenses:   Food Insecurity:    Worried About Charity fundraiser in the Last Year:    Arboriculturist in the Last Year:   Transportation Needs:    Film/video editor (Medical):    Lack of Transportation (Non-Medical):   Physical Activity:    Days of Exercise per Week:    Minutes of Exercise per Session:   Stress:    Feeling of Stress :   Social Connections:    Frequency of Communication with Friends and Family:    Frequency of Social Gatherings with Friends and Family:    Attends Religious Services:    Active Member  of Clubs or Organizations:    Attends Music therapist:    Marital Status:   Intimate Partner Violence:    Fear of Current or Ex-Partner:    Emotionally Abused:    Physically Abused:    Sexually Abused:     Past Surgical History:  Procedure Laterality Date   MYOMECTOMY     TUBAL LIGATION     vena caval filter      Family History  Problem Relation Age of Onset   Lung cancer Father 70       april 12    Pancreatic cancer Father 41   Hypertension Mother    Rheum arthritis Mother    Diabetes Paternal Aunt        x2   Colon cancer Neg Hx    Colon polyps Neg Hx    Kidney disease Neg Hx    Esophageal cancer Neg Hx    Gallbladder disease Neg Hx    Rectal cancer Neg Hx    Stomach cancer Neg Hx     Allergies  Allergen Reactions    Clindamycin/Lincomycin     "unknown"   Tape Other (See Comments)    When pt has a band-aid on, the impression of band-aid stays on for a month    Current Outpatient Medications on File Prior to Visit  Medication Sig Dispense Refill   AMBULATORY NON FORMULARY MEDICATION Medication Name: Nitroglycerin 0.125 mg apply rectally using a gloved finger up until the first knuckle three times a day for 8 weeks. 1 Tube 1   Biotin 5 MG TABS Take by mouth daily.     CVS STOOL SOFTENER 100 MG capsule TAKE 1 CAPSULE BY MOUTH AT BEDTIME AS NEEDED 30 capsule 0   gabapentin (NEURONTIN) 100 MG capsule Take 1 capsule by mouth three times weekly as needed. 90 capsule 0   omeprazole (PRILOSEC) 20 MG capsule Take 1 capsule by mouth three times weekly. 90 capsule 0   warfarin (COUMADIN) 5 MG tablet Take 1 1/2 tablets by mouth daily except take 2 tablets on Wed or as directed by coumadin clinic. 120 tablet 1   No current facility-administered medications on file prior to visit.    BP 122/78    Pulse 80    Temp (!) 95.7 F (35.4 C) (Temporal)    Ht 5\' 5"  (1.651 m)    Wt 220 lb 4 oz (99.9 kg)    SpO2 97%    BMI 36.65 kg/m    Objective:   Physical Exam  Cardiovascular: Normal rate.  Respiratory: Effort normal.  Musculoskeletal:        General: Tenderness present. No swelling.     Comments: 5/5 strength to bilateral upper extremities.  Negative empty can test. Pain to left shoulder with abduction. Tenderness to left lateral neck with light palpation.  Grips equal bilaterally. Face symmetrical. No slurred speech.           Assessment & Plan:

## 2019-11-04 NOTE — Assessment & Plan Note (Signed)
Acute left upper extremity pain, intermittent for three days.   Exam today without evidence of stroke and is without alarm signs. Suspect nerve impingement from muscle spasm or from cervical spine/shoulder.  She is managed on warfarin so NSAID treatment and steroids cannot be utilized. She does have nearly a full bottle of gabapentin at home, will have her take 1-2 capsules up to three times daily, Tylenol if needed.  She will update. Consider imaging and PT if needed.

## 2019-11-04 NOTE — Patient Instructions (Signed)
Start gabapentin 1-2 capsules up to three times daily for pain for one week.   You can also take Tylenol 500 mg up to three times daily for pain.  Stretch when possible. Please notify me if no improvement within a few days.  It was a pleasure to see you today!

## 2019-12-01 ENCOUNTER — Ambulatory Visit: Payer: BC Managed Care – PPO

## 2019-12-02 ENCOUNTER — Ambulatory Visit: Payer: BC Managed Care – PPO

## 2019-12-02 ENCOUNTER — Other Ambulatory Visit: Payer: Self-pay

## 2019-12-02 DIAGNOSIS — Z7901 Long term (current) use of anticoagulants: Secondary | ICD-10-CM

## 2019-12-02 LAB — POCT INR: INR: 2.2 (ref 2.0–3.0)

## 2019-12-02 NOTE — Patient Instructions (Addendum)
Pre visit review using our clinic review tool, if applicable. No additional management support is needed unless otherwise documented below in the visit note.  Increase dose today to 10mg  then continue 7.5mg  daily except take 10mg  on Wednesday. Recheck in 4 wks.

## 2019-12-27 ENCOUNTER — Ambulatory Visit: Payer: BC Managed Care – PPO

## 2019-12-27 ENCOUNTER — Other Ambulatory Visit: Payer: Self-pay

## 2019-12-27 DIAGNOSIS — Z7901 Long term (current) use of anticoagulants: Secondary | ICD-10-CM

## 2019-12-27 LAB — POCT INR: INR: 3.2 — AB (ref 2.0–3.0)

## 2019-12-27 NOTE — Patient Instructions (Addendum)
Pre visit review using our clinic review tool, if applicable. No additional management support is needed unless otherwise documented below in the visit note.  Continue 7.5mg daily except take 10mg on Wednesday. Recheck in 4 wks.  

## 2019-12-28 ENCOUNTER — Ambulatory Visit
Admission: RE | Admit: 2019-12-28 | Discharge: 2019-12-28 | Disposition: A | Discharge status: Home / Self Care | Payer: BC Managed Care – PPO | Source: Ambulatory Visit | Attending: Primary Care | Admitting: Primary Care

## 2019-12-28 ENCOUNTER — Encounter: Payer: Self-pay | Admitting: Radiology

## 2019-12-28 DIAGNOSIS — Z1231 Encounter for screening mammogram for malignant neoplasm of breast: Secondary | ICD-10-CM | POA: Diagnosis not present

## 2020-01-26 ENCOUNTER — Other Ambulatory Visit: Payer: Self-pay

## 2020-01-26 ENCOUNTER — Ambulatory Visit: Payer: BC Managed Care – PPO

## 2020-01-26 DIAGNOSIS — Z7901 Long term (current) use of anticoagulants: Secondary | ICD-10-CM

## 2020-01-26 LAB — POCT INR: INR: 3.3 — AB (ref 2.0–3.0)

## 2020-01-26 NOTE — Patient Instructions (Addendum)
Pre visit review using our clinic review tool, if applicable. No additional management support is needed unless otherwise documented below in the visit note.  Continue 7.5mg  daily except take 10mg  on Wednesday. Recheck in 4 wks.

## 2020-01-31 ENCOUNTER — Encounter: Payer: Self-pay | Admitting: Family Medicine

## 2020-01-31 ENCOUNTER — Other Ambulatory Visit: Payer: Self-pay

## 2020-01-31 ENCOUNTER — Ambulatory Visit: Payer: BC Managed Care – PPO | Admitting: Family Medicine

## 2020-01-31 VITALS — BP 122/74 | HR 71 | Temp 98.0°F | Ht 65.0 in | Wt 220.1 lb

## 2020-01-31 DIAGNOSIS — N3000 Acute cystitis without hematuria: Secondary | ICD-10-CM

## 2020-01-31 DIAGNOSIS — R300 Dysuria: Secondary | ICD-10-CM | POA: Diagnosis not present

## 2020-01-31 DIAGNOSIS — Z7901 Long term (current) use of anticoagulants: Secondary | ICD-10-CM

## 2020-01-31 HISTORY — DX: Acute cystitis without hematuria: N30.00

## 2020-01-31 LAB — POC URINALSYSI DIPSTICK (AUTOMATED)
Bilirubin, UA: NEGATIVE
Glucose, UA: NEGATIVE
Ketones, UA: NEGATIVE
Nitrite, UA: POSITIVE
Protein, UA: POSITIVE — AB
Spec Grav, UA: >=1.03 — AB (ref 1.010–1.025)
Urobilinogen, UA: 0.2 E.U./dL
pH, UA: 6 (ref 5.0–8.0)

## 2020-01-31 MED ORDER — CEPHALEXIN 500 MG PO CAPS: 500.0000 mg | ORAL_CAPSULE | Freq: Two times a day (BID) | ORAL | 0 refills | Status: DC

## 2020-01-31 NOTE — Progress Notes (Addendum)
This visit was conducted in person.  BP 122/74 (BP Location: Left Arm, Patient Position: Sitting, Cuff Size: Large)   Pulse 71   Temp 98 F (36.7 C) (Temporal)   Ht 5\' 5"  (1.651 m)   Wt 220 lb 1 oz (99.8 kg)   SpO2 97%   BMI 36.62 kg/m    CC: "I think I had a UTI this weekend" Subjective:    Patient ID: Theresa Lewis, female    DOB: March 17, 1961, 59 y.o.   MRN: [id number]  HPI: Theresa Lewis is a 59 y.o. female presenting on 01/31/2020 for Dysuria (C/o burning with urination, RLQ abd pain and right flank pain.  Sxs 01/27/20. Tried AZO, helpful.)   4d h/o R lower back pain as well as discomfort to pelvic area. Notes increased frequency, urgency, wakes her up at night. Dysuria at end of stream. Increased fatigue as well.  Treated with azo and heating pad with benefit.  Has never had UTI in the past.  No h/o kidney stones.  No recent abx use.  No noted hematuria, fevers/chills, nausea/vomiting, or flank pain. Drinks lots of pepsi, otherwise avoids caffeine. She started using new body wash (cheaper brand). No new rashes or vaginal discharge  On coumadin for h/o DVT and PE.  INR goal 2.5-3.5.      Relevant past medical, surgical, family and social history reviewed and updated as indicated. Interim medical history since our last visit reviewed. Allergies and medications reviewed and updated. Outpatient Medications Prior to Visit  Medication Sig Dispense Refill  . AMBULATORY NON FORMULARY MEDICATION Medication Name: Nitroglycerin 0.125 mg apply rectally using a gloved finger up until the first knuckle three times a day for 8 weeks. 1 Tube 1  . Biotin 5 MG TABS Take by mouth daily.    . CVS STOOL SOFTENER 100 MG capsule TAKE 1 CAPSULE BY MOUTH AT BEDTIME AS NEEDED 30 capsule 0  . gabapentin (NEURONTIN) 100 MG capsule Take 1 capsule by mouth three times weekly as needed. 90 capsule 0  . omeprazole (PRILOSEC) 20 MG capsule Take 1 capsule by mouth three times weekly. 90 capsule 0    . warfarin (COUMADIN) 5 MG tablet Take 1 1/2 tablets by mouth daily except take 2 tablets on Wed or as directed by coumadin clinic. 120 tablet 1   No facility-administered medications prior to visit.     Per HPI unless specifically indicated in ROS section below Review of Systems Objective:  BP 122/74 (BP Location: Left Arm, Patient Position: Sitting, Cuff Size: Large)   Pulse 71   Temp 98 F (36.7 C) (Temporal)   Ht 5\' 5"  (1.651 m)   Wt 220 lb 1 oz (99.8 kg)   SpO2 97%   BMI 36.62 kg/m   Wt Readings from Last 3 Encounters:  01/31/20 220 lb 1 oz (99.8 kg)  11/04/19 220 lb 4 oz (99.9 kg)  07/28/19 213 lb (96.6 kg)      Physical Exam Vitals and nursing note reviewed.  Constitutional:      Appearance: Normal appearance. She is not ill-appearing.  Abdominal:     General: Abdomen is flat. Bowel sounds are normal. There is no distension.     Palpations: Abdomen is soft. There is no mass.     Tenderness: There is abdominal tenderness (mild pressure discomfort) in the suprapubic area. There is no right CVA tenderness, left CVA tenderness, guarding or rebound. Negative signs include Murphy's sign.     Hernia: No hernia  is present.  Skin:    General: Skin is warm and dry.     Findings: No rash.  Neurological:     Mental Status: She is alert.  Psychiatric:        Mood and Affect: Mood normal.        Behavior: Behavior normal.       Results for orders placed or performed in visit on 01/31/20  POCT Urinalysis Dipstick (Automated)  Result Value Ref Range   Color, UA yellow    Clarity, UA clear    Glucose, UA Negative Negative   Bilirubin, UA negative    Ketones, UA negative    Spec Grav, UA >=1.030 (A) 1.010 - 1.025   Blood, UA 3+    pH, UA 6.0 5.0 - 8.0   Protein, UA Positive (A) Negative   Urobilinogen, UA 0.2 0.2 or 1.0 E.U./dL   Nitrite, UA positive    Leukocytes, UA Moderate (2+) (A) Negative  not enough sample to spin or culture.  Assessment & Plan:  This visit  occurred during the SARS-CoV-2 public health emergency.  Safety protocols were in place, including screening questions prior to the visit, additional usage of staff PPE, and extensive cleaning of exam room while observing appropriate contact time as indicated for disinfecting solutions.   Problem List Items Addressed This Visit    Long term (current) use of anticoagulants    Will forward note to [facility] RN at coumadin clinic. Don't think keflex course should alter INR significantly.       Acute cystitis without hematuria - Primary    Typical symptoms of acute cystitis in setting of recently changing body wash (has now returned to previous brand).  Limited urine sample provided today - only enough to run UA.  Rx keflex 7d course.  Update if ongoing symptoms after treatment.  Given 3+ RBC on UA, I asked her to return in 2-3 wks for lab visit only to recheck UA.        Other Visit Diagnoses    Dysuria       Relevant Orders   POCT Urinalysis Dipstick (Automated) (Completed)       Meds ordered this encounter  Medications  . cephALEXin (KEFLEX) 500 MG capsule    Sig: Take 1 capsule (500 mg total) by mouth 2 (two) times daily.    Dispense:  14 capsule    Refill:  0   Orders Placed This Encounter  Procedures  . POCT Urinalysis Dipstick (Automated)    Patient instructions: You have typical symptoms of urine tract infection (bladder infection). Urinalysis also looks like UTI.  Treat with keflex 7 day course.  Limit caffeinated beverages, increase water.  May use tylenol as needed for discomfort Let us know if ongoing symptoms after treatment.   Follow up plan: Return if symptoms worsen or fail to improve.  Ria Bush, MD

## 2020-01-31 NOTE — Patient Instructions (Signed)
You have typical symptoms of urine tract infection (bladder infection). Urinalysis also looks like UTI.  Treat with keflex 7 day course.  Limit caffeinated beverages, increase water.  May use tylenol as needed for discomfort Let us know if ongoing symptoms after treatment.   Urinary Tract Infection, Adult  A urinary tract infection (UTI) is an infection of any part of the urinary tract. The urinary tract includes the kidneys, ureters, bladder, and urethra. These organs make, store, and get rid of urine in the body. Your health care provider may use other names to describe the infection. An upper UTI affects the ureters and kidneys (pyelonephritis). A lower UTI affects the bladder (cystitis) and urethra (urethritis). What are the causes? Most urinary tract infections are caused by bacteria in your genital area, around the entrance to your urinary tract (urethra). These bacteria grow and cause inflammation of your urinary tract. What increases the risk? You are more likely to develop this condition if:  You have a urinary catheter that stays in place (indwelling).  You are not able to control when you urinate or have a bowel movement (you have incontinence).  You are female and you: ? Use a spermicide or diaphragm for birth control. ? Have low estrogen levels. ? Are pregnant.  You have certain genes that increase your risk (genetics).  You are sexually active.  You take antibiotic medicines.  You have a condition that causes your flow of urine to slow down, such as: ? An enlarged prostate, if you are female. ? Blockage in your urethra (stricture). ? A kidney stone. ? A nerve condition that affects your bladder control (neurogenic bladder). ? Not getting enough to drink, or not urinating often.  You have certain medical conditions, such as: ? Diabetes. ? A weak disease-fighting system (immunesystem). ? Sickle cell disease. ? Gout. ? Spinal cord injury. What are the signs or  symptoms? Symptoms of this condition include:  Needing to urinate right away (urgently).  Frequent urination or passing small amounts of urine frequently.  Pain or burning with urination.  Blood in the urine.  Urine that smells bad or unusual.  Trouble urinating.  Cloudy urine.  Vaginal discharge, if you are female.  Pain in the abdomen or the lower back. You may also have:  Vomiting or a decreased appetite.  Confusion.  Irritability or tiredness.  A fever.  Diarrhea. The first symptom in older adults may be confusion. In some cases, they may not have any symptoms until the infection has worsened. How is this diagnosed? This condition is diagnosed based on your medical history and a physical exam. You may also have other tests, including:  Urine tests.  Blood tests.  Tests for sexually transmitted infections (STIs). If you have had more than one UTI, a cystoscopy or imaging studies may be done to determine the cause of the infections. How is this treated? Treatment for this condition includes:  Antibiotic medicine.  Over-the-counter medicines to treat discomfort.  Drinking enough water to stay hydrated. If you have frequent infections or have other conditions such as a kidney stone, you may need to see a health care provider who specializes in the urinary tract (urologist). In rare cases, urinary tract infections can cause sepsis. Sepsis is a life-threatening condition that occurs when the body responds to an infection. Sepsis is treated in the hospital with IV antibiotics, fluids, and other medicines. Follow these instructions at home:  Medicines  Take over-the-counter and prescription medicines only as told by  your health care provider.  If you were prescribed an antibiotic medicine, take it as told by your health care provider. Do not stop using the antibiotic even if you start to feel better. General instructions  Make sure you: ? Empty your bladder  often and completely. Do not hold urine for long periods of time. ? Empty your bladder after sex. ? Wipe from front to back after a bowel movement if you are female. Use each tissue one time when you wipe.  Drink enough fluid to keep your urine pale yellow.  Keep all follow-up visits as told by your health care provider. This is important. Contact a health care provider if:  Your symptoms do not get better after 1-2 days.  Your symptoms go away and then return. Get help right away if you have:  Severe pain in your back or your lower abdomen.  A fever.  Nausea or vomiting. Summary  A urinary tract infection (UTI) is an infection of any part of the urinary tract, which includes the kidneys, ureters, bladder, and urethra.  Most urinary tract infections are caused by bacteria in your genital area, around the entrance to your urinary tract (urethra).  Treatment for this condition often includes antibiotic medicines.  If you were prescribed an antibiotic medicine, take it as told by your health care provider. Do not stop using the antibiotic even if you start to feel better.  Keep all follow-up visits as told by your health care provider. This is important. This information is not intended to replace advice given to you by your health care provider. Make sure you discuss any questions you have with your health care provider. Document Revised: 04/29/2018 Document Reviewed: 11/19/2017 Elsevier Patient Education  2020 [employer].

## 2020-01-31 NOTE — Assessment & Plan Note (Addendum)
Typical symptoms of acute cystitis in setting of recently changing body wash (has now returned to previous brand).  Limited urine sample provided today - only enough to run UA.  Rx keflex 7d course.  Update if ongoing symptoms after treatment.  Given 3+ RBC on UA, I asked her to return in 2-3 wks for lab visit only to recheck UA.

## 2020-01-31 NOTE — Addendum Note (Signed)
Addended by: Brenton Grills on: 06/30/2710 92:90 AM   Modules accepted: Orders

## 2020-01-31 NOTE — Assessment & Plan Note (Signed)
Will forward note to [facility] RN at coumadin clinic. Don't think keflex course should alter INR significantly.

## 2020-02-01 ENCOUNTER — Telehealth: Payer: Self-pay

## 2020-02-01 NOTE — Telephone Encounter (Signed)
Per Dr. Danise Mina  Message Received: Theresa Marseilles, MD  [name], RN Fyi starting this coumadin patient on 1 wk keflex course for UTI. Don't think it should alter INR too much.  Thanks,  Theresa Lewis    Keflex does not have interaction with coumadin so no INR check in necessary. Next INR check in 02/23/20.

## 2020-02-15 ENCOUNTER — Other Ambulatory Visit: Payer: Self-pay | Admitting: Primary Care

## 2020-02-15 DIAGNOSIS — N3000 Acute cystitis without hematuria: Secondary | ICD-10-CM

## 2020-02-16 ENCOUNTER — Telehealth: Payer: Self-pay | Admitting: *Deleted

## 2020-02-16 NOTE — Telephone Encounter (Signed)
Called patient she works from 730 to 5 and not able to come given sample. She will hold off until appointment next week. If she is able to leave early she will come by our office.

## 2020-02-16 NOTE — Telephone Encounter (Signed)
Pt called Theresa Lewis. She recently saw Dr. Darnell Level for a UTI and now has finished her round of abx and is scheduled for a UA drop off for UA and Cx on 02/23/20 to make sure her UTI has resolved. Pt said that some of her sxs have resolved but the urinary urgency and dysuria is still there. Pt is asking if she can get another round of abx sent in to help resolve her UTI completely. Pt doesn't want to wait until 02/23/20 to see if she has a UTI because she will have had UTI sxs for another week and that's why she is asking for her abx sent to pharmacy.  [facility]

## 2020-02-16 NOTE — Telephone Encounter (Signed)
She will need to come by to provide another specimen. Can come by today, I have both urinalysis and culture pending. Will be in touch with results.

## 2020-02-16 NOTE — Telephone Encounter (Signed)
Noted  

## 2020-02-17 ENCOUNTER — Other Ambulatory Visit: Payer: Self-pay

## 2020-02-17 ENCOUNTER — Other Ambulatory Visit (INDEPENDENT_AMBULATORY_CARE_PROVIDER_SITE_OTHER): Payer: BC Managed Care – PPO

## 2020-02-17 DIAGNOSIS — N3000 Acute cystitis without hematuria: Secondary | ICD-10-CM | POA: Diagnosis not present

## 2020-02-17 LAB — POC URINALSYSI DIPSTICK (AUTOMATED)
Bilirubin, UA: NEGATIVE
Glucose, UA: NEGATIVE
Ketones, UA: POSITIVE
Nitrite, UA: POSITIVE
Protein, UA: POSITIVE — AB
Spec Grav, UA: >=1.03 — AB (ref 1.010–1.025)
Urobilinogen, UA: 1 E.U./dL
pH, UA: 5.5 (ref 5.0–8.0)

## 2020-02-19 LAB — URINE CULTURE
MICRO NUMBER:: 10994624
SPECIMEN QUALITY:: ADEQUATE

## 2020-02-20 ENCOUNTER — Telehealth: Payer: Self-pay | Admitting: *Deleted

## 2020-02-20 NOTE — Telephone Encounter (Signed)
If the bleeding has stopped, then it seems things are improving. If she is concerned, she can follow up with another provider while Anda Kraft is out of the office.

## 2020-02-20 NOTE — Telephone Encounter (Signed)
Patient called stating that she saw the message from Allie Bossier NP on my chart from her urine test. Patient stated that she is still having symptoms of urgency and frequency. Patient stated yesterday she urinated twice and there was bright red blood in the toilet. Patient stated that once when she wiped there was a small clot of blood on the tissue paper. Patient stated that the blood in the toilet happened twice yesterday within two hours. Patient stated since that happed there has not been any blood that she can see. Patient stated that she has urinated this morning and she has not seen any blood. Patient stated that she is concerned because she is on coumadin.

## 2020-02-20 NOTE — Telephone Encounter (Signed)
Called patient states that she is not able to come in office today she would like to have message reviewed by Theresa Lewis when she returns in office. Patient is aware that she will not be in until tomorrow. I have reviewed all red words with patient that would need to be seen at urgent care or ED. She has repeated back to me. Will call If any further questions.

## 2020-02-21 ENCOUNTER — Other Ambulatory Visit: Payer: BC Managed Care – PPO

## 2020-02-21 NOTE — Telephone Encounter (Signed)
Note reviewed, will evaluate patient during scheduled appointment.

## 2020-02-22 ENCOUNTER — Other Ambulatory Visit: Payer: Self-pay

## 2020-02-22 ENCOUNTER — Ambulatory Visit (INDEPENDENT_AMBULATORY_CARE_PROVIDER_SITE_OTHER): Payer: BC Managed Care – PPO | Admitting: Family Medicine

## 2020-02-22 ENCOUNTER — Encounter: Payer: Self-pay | Admitting: Family Medicine

## 2020-02-22 VITALS — BP 132/78 | HR 56 | Temp 98.0°F | Ht 65.0 in | Wt 218.9 lb

## 2020-02-22 DIAGNOSIS — N3091 Cystitis, unspecified with hematuria: Secondary | ICD-10-CM

## 2020-02-22 DIAGNOSIS — R319 Hematuria, unspecified: Secondary | ICD-10-CM | POA: Diagnosis not present

## 2020-02-22 LAB — POCT URINALYSIS DIPSTICK
Bilirubin, UA: NEGATIVE
Blood, UA: POSITIVE
Glucose, UA: NEGATIVE
Ketones, UA: NEGATIVE
Nitrite, UA: POSITIVE
Protein, UA: POSITIVE — AB
Spec Grav, UA: >=1.03 — AB (ref 1.010–1.025)
Urobilinogen, UA: 0.2 E.U./dL
pH, UA: 6 (ref 5.0–8.0)

## 2020-02-22 MED ORDER — CIPROFLOXACIN HCL 500 MG PO TABS: 500.0000 mg | ORAL_TABLET | Freq: Two times a day (BID) | ORAL | 0 refills | Status: DC

## 2020-02-22 NOTE — Progress Notes (Signed)
Established Patient Office Visit  Subjective:  Patient ID: Theresa Lewis, female    DOB: 10/12/1960  Age: 59 y.o. MRN: [id number]  CC:  Chief Complaint  Patient presents with  . Hematuria    pressure in right lower abdomen into groin, on coumadin and clots in urine off and on    HPI Theresa Lewis presents for persistent urinary symptoms of frequency and burning at the end of urination.  Her recent history is that she was treated for UTI on the seventh of this month.  She was treated with Keflex 500 mg twice daily for 7 days.  She states that her symptoms never fully resolved with that.  She takes Coumadin and has recently had some blood clots in her urine.  No systemic fever.  No flank pain.  No nausea or vomiting.  She actually had repeat urinalysis with culture on the 24th and this came back positive for Klebsiella. This was susceptible to all antibiotics except Ampicillin.   She is scheduled to get her pro time rechecked tomorrow.  Past Medical History:  Diagnosis Date  . ABNORMAL VAGINAL BLEEDING 04/19/2007   Qualifier: Diagnosis of  By: Hulan Saas, CMA (AAMA), [place] Skye   . Deep venous thrombosis ([place])    2006    3 x clotting   . HEMORRHOIDS, WITH BLEEDING 12/26/2008   Qualifier: Diagnosis of  By: Regis Bill MD, Standley Brooking   . PE (pulmonary embolism) 07/09/2010  . Pulmonary embolism [facility])     Past Surgical History:  Procedure Laterality Date  . MYOMECTOMY    . TUBAL LIGATION    . vena caval filter      Family History  Problem Relation Age of Onset  . Lung cancer Father 73       april 12   . Pancreatic cancer Father 49  . Hypertension Mother   . Rheum arthritis Mother   . Diabetes Paternal Aunt        x2  . Colon cancer Neg Hx   . Colon polyps Neg Hx   . Kidney disease Neg Hx   . Esophageal cancer Neg Hx   . Gallbladder disease Neg Hx   . Rectal cancer Neg Hx   . Stomach cancer Neg Hx     Social History   Socioeconomic History  . Marital status: Married     Spouse name: Not on file  . Number of children: 1  . Years of education: Not on file  . Highest education level: Not on file  Occupational History  . Occupation: student advocate   Tobacco Use  . Smoking status: Never Smoker  . Smokeless tobacco: Never Used  Substance and Sexual Activity  . Alcohol use: Yes    Alcohol/week: 0.0 standard drinks    Comment: rarely  . Drug use: No  . Sexual activity: Not on file  Other Topics Concern  . Not on file  Social History Narrative   HH of 3  Daughter 30 and self.  hh of 2    No pets.   No ets.   No etoh.    Working for [employer] and [employer] .  45- 50 hours per week.                Social Determinants of Health   Financial Resource Strain:   . Difficulty of Paying Living Expenses: Not on file  Food Insecurity:   . Worried About Charity fundraiser in the Last Year: Not  on file  . Ran Out of Food in the Last Year: Not on file  Transportation Needs:   . Lack of Transportation (Medical): Not on file  . Lack of Transportation (Non-Medical): Not on file  Physical Activity:   . Days of Exercise per Week: Not on file  . Minutes of Exercise per Session: Not on file  Stress:   . Feeling of Stress : Not on file  Social Connections:   . Frequency of Communication with Friends and Family: Not on file  . Frequency of Social Gatherings with Friends and Family: Not on file  . Attends Religious Services: Not on file  . Active Member of Clubs or Organizations: Not on file  . Attends Archivist Meetings: Not on file  . Marital Status: Not on file  Intimate Partner Violence:   . Fear of Current or Ex-Partner: Not on file  . Emotionally Abused: Not on file  . Physically Abused: Not on file  . Sexually Abused: Not on file    Outpatient Medications Prior to Visit  Medication Sig Dispense Refill  . AMBULATORY NON FORMULARY MEDICATION Medication Name: Nitroglycerin 0.125 mg apply rectally using a gloved finger up until the first  knuckle three times a day for 8 weeks. 1 Tube 1  . Biotin 5 MG TABS Take by mouth daily.    . [facility]S STOOL SOFTENER 100 MG capsule TAKE 1 CAPSULE BY MOUTH AT BEDTIME AS NEEDED 30 capsule 0  . gabapentin (NEURONTIN) 100 MG capsule Take 1 capsule by mouth three times weekly as needed. 90 capsule 0  . omeprazole (PRILOSEC) 20 MG capsule Take 1 capsule by mouth three times weekly. 90 capsule 0  . warfarin (COUMADIN) 5 MG tablet Take 1 1/2 tablets by mouth daily except take 2 tablets on Wed or as directed by coumadin clinic. 120 tablet 1  . cephALEXin (KEFLEX) 500 MG capsule Take 1 capsule (500 mg total) by mouth 2 (two) times daily. (Patient not taking: Reported on 02/22/2020) 14 capsule 0   No facility-administered medications prior to visit.    Allergies  Allergen Reactions  . Clindamycin/Lincomycin     "unknown"  . Tape Other (See Comments)    When pt has a band-aid on, the impression of band-aid stays on for a month    ROS Review of Systems  Constitutional: Negative for chills and fever.  Gastrointestinal: Negative for nausea and vomiting.  Genitourinary: Positive for dysuria and hematuria.      Objective:    Physical Exam Vitals reviewed.  Constitutional:      Appearance: She is not ill-appearing or toxic-appearing.  Cardiovascular:     Rate and Rhythm: Normal rate.  Pulmonary:     Effort: Pulmonary effort is normal.     Breath sounds: Normal breath sounds.  Musculoskeletal:     Comments: No flank tenderness.  Neurological:     Mental Status: She is alert.     BP 132/78   Pulse (!) 56   Temp 98 F (36.7 C) (Oral)   Ht 5\' 5"  (1.651 m)   Wt 218 lb 14.4 oz (99.3 kg)   SpO2 98%   BMI 36.43 kg/m  Wt Readings from Last 3 Encounters:  02/22/20 218 lb 14.4 oz (99.3 kg)  01/31/20 220 lb 1 oz (99.8 kg)  11/04/19 220 lb 4 oz (99.9 kg)     Health Maintenance Due  Topic Date Due  . HIV Screening  Never done  . INFLUENZA VACCINE  12/25/2019  There are no preventive  care reminders to display for this patient.  Lab Results  Component Value Date   TSH 1.57 01/26/2015   Lab Results  Component Value Date   WBC 5.4 10/21/2018   HGB 12.5 10/21/2018   HCT 38.0 10/21/2018   MCV 79.5 10/21/2018   PLT 317.0 10/21/2018   Lab Results  Component Value Date   NA 139 07/28/2019   K 4.2 07/28/2019   CO2 28 07/28/2019   GLUCOSE 83 07/28/2019   BUN 15 07/28/2019   CREATININE 0.93 07/28/2019   BILITOT 0.5 07/28/2019   ALKPHOS 82 07/28/2019   AST 14 07/28/2019   ALT 14 07/28/2019   PROT 6.9 07/28/2019   ALBUMIN 3.7 07/28/2019   CALCIUM 9.2 07/28/2019   GFR 74.46 07/28/2019   Lab Results  Component Value Date   CHOL 181 07/28/2019   Lab Results  Component Value Date   HDL 52.90 07/28/2019   Lab Results  Component Value Date   LDLCALC 108 (H) 07/28/2019   Lab Results  Component Value Date   TRIG 105.0 07/28/2019   Lab Results  Component Value Date   CHOLHDL 3 07/28/2019   Lab Results  Component Value Date   HGBA1C 5.8 07/28/2019      Assessment & Plan:   Hemorrhagic cystitis in a patient on Coumadin.  Recent culture 5 days ago growing Klebsiella pneumonia.  Patient is nontoxic in appearance.  Afebrile.  She was on Keflex and this should have worked based on recent culture.  She did not seem to respond to the Keflex.  -We will cover with Cipro 500 g twice daily for 5 days.  She is aware that this and most antibiotics can affect her pro time.  She has follow-up scheduled to get this checked tomorrow -Stay well-hydrated -Follow-up promptly for any fever or any persistent or worsening symptoms  Meds ordered this encounter  Medications  . ciprofloxacin (CIPRO) 500 MG tablet    Sig: Take 1 tablet (500 mg total) by mouth 2 (two) times daily.    Dispense:  10 tablet    Refill:  0    Follow-up: No follow-ups on file.    Carolann Littler, MD

## 2020-02-22 NOTE — Telephone Encounter (Signed)
Scheduled appt w/Dr. Elease Hashimoto at [facility] for eval at 2:45pm today

## 2020-02-22 NOTE — Patient Instructions (Signed)

## 2020-02-23 ENCOUNTER — Other Ambulatory Visit: Payer: BC Managed Care – PPO

## 2020-02-23 ENCOUNTER — Telehealth: Payer: Self-pay

## 2020-02-23 ENCOUNTER — Other Ambulatory Visit: Payer: Self-pay

## 2020-02-23 ENCOUNTER — Ambulatory Visit (INDEPENDENT_AMBULATORY_CARE_PROVIDER_SITE_OTHER): Payer: BC Managed Care – PPO

## 2020-02-23 DIAGNOSIS — Z7901 Long term (current) use of anticoagulants: Secondary | ICD-10-CM

## 2020-02-23 LAB — POCT INR: INR: 2.5 (ref 2.0–3.0)

## 2020-02-23 NOTE — Telephone Encounter (Signed)
I spoke with patient today via phone regarding her concerns.  I apologized for the perception of how she was treated and assured her that our office will take her feedback seriously.    One thing she mentioned was that the answer machine stated that she could leave a message, but it may be 72 hours before she may receive a call back.  This may be something we may want to change on our machine, that seems a bit extreme.   She has already noticed improvement in her symptoms and is feeling better.  She will update if anything changes.  She thanked me for our call, and hopes that her husband will allow her to return to our clinic as she has always received great care.

## 2020-02-23 NOTE — Patient Instructions (Signed)
Pre visit review using our clinic review tool, if applicable. No additional management support is needed unless otherwise documented below in the visit note.  Continue 7.5mg  daily except take 10mg  on Wednesday. Recheck in 5 wks.

## 2020-02-23 NOTE — Telephone Encounter (Signed)
Pt was in clinic today for INR coumadin clinic. She reports she was seen at [facility] yesterday and Dr. Elease Hashimoto for a UTI and she was prescribed cipro. Pt was very upset how she had been treated by this office since talking with the office about still having symptoms after taking the first round of abx prescribed after seeing Dr. Darnell Level in the office on 9/7. She reported she could not make it into the office to leave a sample due to work. She had called and reported she was having hematuria with clots but it seemed as though no one cared even though she was taking coumadin.    She reports it seems like no one cared for her well-being and she was not responded to until she contacted the office and spoke to [facility] who then scheduled her at [place] since there were no apts available at [facility]. Pt reports her husband is extremely upset in how she was treated and is wanting pt to stop coming to this clinic and go back to [place]. She said he did not want her to come to her coumadin apt today but she told him the importance of it. She likes coming to this clinic and likes [facility] but is also upset and does not think she was treated very well through this. Pt was tearful in the office. ' Advised pt this information would be brought to the practice administrator and [facility] and if anything else was needed to contact this nurse on the coumadin clinic phone. Apologized to pt that she felt she was not treated well and that there would be f/u on this. Pt appreciative and verbalized understanding but reported again she is not sure if her husband is going to allow her to come back to this office.

## 2020-03-06 ENCOUNTER — Telehealth: Payer: Self-pay | Admitting: Primary Care

## 2020-03-06 NOTE — Telephone Encounter (Signed)
Patient called office and is wondering if INR checks can be done on Friday now instead. Unfortunately due to work patient can no longer come on Tuesday or Thursdays. Patient is wonder if this cant be done if a lab order can be placed when it is needed, or if she needs to go to [place]. Please advise.

## 2020-03-07 NOTE — Telephone Encounter (Signed)
Pt currently has apt on 11/4. Pt reports currently she is having a difficult time getting time off on a Tues or Thurs for coumadin clinic. She would like to come on Friday if possible. She would like to keep the apt on 11/4 and is trying to work out with her boss to keep coming on Tues and Thurs but not sure if she will ok it. Advised she could come on Fri if necessary. Advised it would be best to do a POCT rather than a lab draw in order to get result at that time so dosing adjustments could be made immediately if needed. She will let this office know at her apt on 11/4 if she needs to start coming on Fridays. Advised if anything is needed before her apt to contact the office. Pt verbalized understanding.

## 2020-03-13 NOTE — Telephone Encounter (Signed)
Thank you for speaking to patient.  I am sorry she has had a negative experience.  I wanted to follow up that I have investigated our nurse line message.  This message is the [place] system message and not one that our office specifically created.  It states that if a patient has an urgent or emergent request to please call 911 or go to nearest emergency room for treatment otherwise to please leave a name and number and a nurse will return the call within 72 hours.   I am not sure who created or approved this system message but I will look in to it further with our division and determine if we can adjust this.

## 2020-03-16 ENCOUNTER — Ambulatory Visit: Payer: BC Managed Care – PPO | Admitting: Primary Care

## 2020-03-16 ENCOUNTER — Ambulatory Visit (INDEPENDENT_AMBULATORY_CARE_PROVIDER_SITE_OTHER)
Admission: RE | Admit: 2020-03-16 | Discharge: 2020-03-16 | Disposition: A | Discharge status: Home / Self Care | Payer: BC Managed Care – PPO | Source: Ambulatory Visit | Attending: Primary Care | Admitting: Primary Care

## 2020-03-16 ENCOUNTER — Encounter: Payer: Self-pay | Admitting: Primary Care

## 2020-03-16 ENCOUNTER — Other Ambulatory Visit: Payer: Self-pay

## 2020-03-16 VITALS — BP 134/78 | HR 60 | Temp 98.6°F | Ht 65.0 in | Wt 219.0 lb

## 2020-03-16 DIAGNOSIS — N3000 Acute cystitis without hematuria: Secondary | ICD-10-CM

## 2020-03-16 DIAGNOSIS — R319 Hematuria, unspecified: Secondary | ICD-10-CM | POA: Diagnosis not present

## 2020-03-16 DIAGNOSIS — R300 Dysuria: Secondary | ICD-10-CM | POA: Diagnosis not present

## 2020-03-16 DIAGNOSIS — R7303 Prediabetes: Secondary | ICD-10-CM

## 2020-03-16 DIAGNOSIS — R109 Unspecified abdominal pain: Secondary | ICD-10-CM | POA: Diagnosis not present

## 2020-03-16 DIAGNOSIS — Z23 Encounter for immunization: Secondary | ICD-10-CM

## 2020-03-16 LAB — CBC WITH DIFFERENTIAL/PLATELET
Basophils Absolute: 0.1 10*3/uL (ref 0.0–0.1)
Basophils Relative: 1.3 % (ref 0.0–3.0)
Eosinophils Absolute: 0.3 10*3/uL (ref 0.0–0.7)
Eosinophils Relative: 5.3 % — ABNORMAL HIGH (ref 0.0–5.0)
HCT: 38.2 % (ref 36.0–46.0)
Hemoglobin: 12.1 g/dL (ref 12.0–15.0)
Lymphocytes Relative: 32.9 % (ref 12.0–46.0)
Lymphs Abs: 1.6 10*3/uL (ref 0.7–4.0)
MCHC: 31.6 g/dL (ref 30.0–36.0)
MCV: 79.5 fl (ref 78.0–100.0)
Monocytes Absolute: 0.3 10*3/uL (ref 0.1–1.0)
Monocytes Relative: 6.1 % (ref 3.0–12.0)
Neutro Abs: 2.6 10*3/uL (ref 1.4–7.7)
Neutrophils Relative %: 54.4 % (ref 43.0–77.0)
Platelets: 330 10*3/uL (ref 150.0–400.0)
RBC: 4.81 Mil/uL (ref 3.87–5.11)
RDW: 15.4 % (ref 11.5–15.5)
WBC: 4.8 10*3/uL (ref 4.0–10.5)

## 2020-03-16 LAB — POCT URINALYSIS DIPSTICK
Bilirubin, UA: NEGATIVE
Glucose, UA: NEGATIVE
Ketones, UA: NEGATIVE
Nitrite, UA: POSITIVE
Protein, UA: POSITIVE — AB
Spec Grav, UA: 1.02 (ref 1.010–1.025)
Urobilinogen, UA: 2 E.U./dL — AB
pH, UA: 6 (ref 5.0–8.0)

## 2020-03-16 LAB — BASIC METABOLIC PANEL
BUN: 15 mg/dL (ref 6–23)
CO2: 28 mEq/L (ref 19–32)
Calcium: 9.1 mg/dL (ref 8.4–10.5)
Chloride: 106 mEq/L (ref 96–112)
Creatinine, Ser: 1.09 mg/dL (ref 0.40–1.20)
GFR: 55.25 mL/min — ABNORMAL LOW (ref >60.00)
Glucose, Bld: 86 mg/dL (ref 70–99)
Potassium: 4.1 mEq/L (ref 3.5–5.1)
Sodium: 140 mEq/L (ref 135–145)

## 2020-03-16 LAB — HEMOGLOBIN A1C: Hgb A1c MFr Bld: 6.1 % (ref 4.6–6.5)

## 2020-03-16 MED ORDER — NITROFURANTOIN MONOHYD MACRO 100 MG PO CAPS: 100.0000 mg | ORAL_CAPSULE | Freq: Two times a day (BID) | ORAL | 0 refills | Status: AC

## 2020-03-16 NOTE — Patient Instructions (Addendum)
Start Macrobid (nitrofurantoin) tablets for urinary tract infection. Take 1 tablet by mouth twice daily for seven days.  I'll be in touch regarding your urine culture results.   Please update me via My Chart mid next week.  It was a pleasure to see you today!     Influenza (Flu) Vaccine (Inactivated or Recombinant): What You Need to Know 1. Why get vaccinated? Influenza vaccine can prevent influenza (flu). Flu is a contagious disease that spreads around the [place] every year, usually between October and May. Anyone can get the flu, but it is more dangerous for some people. Infants and young children, people 91 years of age and older, pregnant women, and people with certain health conditions or a weakened immune system are at greatest risk of flu complications. Pneumonia, bronchitis, sinus infections and ear infections are examples of flu-related complications. If you have a medical condition, such as heart disease, cancer or diabetes, flu can make it worse. Flu can cause fever and chills, sore throat, muscle aches, fatigue, cough, headache, and runny or stuffy nose. Some people may have vomiting and diarrhea, though this is more common in children than adults. Each year thousands of people in the [place] die from flu, and many more are hospitalized. Flu vaccine prevents millions of illnesses and flu-related visits to the doctor each year. 2. Influenza vaccine CDC recommends everyone 65 months of age and older get vaccinated every flu season. Children 6 months through 44 years of age may need 2 doses during a single flu season. Everyone else needs only 1 dose each flu season. It takes about 2 weeks for protection to develop after vaccination. There are many flu viruses, and they are always changing. Each year a new flu vaccine is made to protect against three or four viruses that are likely to cause disease in the upcoming flu season. Even when the vaccine doesn't exactly match these  viruses, it may still provide some protection. Influenza vaccine does not cause flu. Influenza vaccine may be given at the same time as other vaccines. 3. Talk with your health care provider Tell your vaccine provider if the person getting the vaccine:  Has had an allergic reaction after a previous dose of influenza vaccine, or has any severe, life-threatening allergies.  Has ever had Guillain-Barr Syndrome (also called GBS). In some cases, your health care provider may decide to postpone influenza vaccination to a future visit. People with minor illnesses, such as a cold, may be vaccinated. People who are moderately or severely ill should usually wait until they recover before getting influenza vaccine. Your health care provider can give you more information. 4. Risks of a vaccine reaction  Soreness, redness, and swelling where shot is given, fever, muscle aches, and headache can happen after influenza vaccine.  There may be a very small increased risk of Guillain-Barr Syndrome (GBS) after inactivated influenza vaccine (the flu shot). Young children who get the flu shot along with pneumococcal vaccine (PCV13), and/or DTaP vaccine at the same time might be slightly more likely to have a seizure caused by fever. Tell your health care provider if a child who is getting flu vaccine has ever had a seizure. People sometimes faint after medical procedures, including vaccination. Tell your provider if you feel dizzy or have vision changes or ringing in the ears. As with any medicine, there is a very remote chance of a vaccine causing a severe allergic reaction, other serious injury, or death. 5. What if there is a  serious problem? An allergic reaction could occur after the vaccinated person leaves the clinic. If you see signs of a severe allergic reaction (hives, swelling of the face and throat, difficulty breathing, a fast heartbeat, dizziness, or weakness), call 9-1-1 and get the person to the  nearest hospital. For other signs that concern you, call your health care provider. Adverse reactions should be reported to the Vaccine Adverse Event Reporting System (VAERS). Your health care provider will usually file this report, or you can do it yourself. Visit the VAERS website at www.vaers.SamedayNews.es or call 5730640382.VAERS is only for reporting reactions, and VAERS staff do not give medical advice. 6. The National Vaccine Injury Compensation Program The [employer] Vaccine Injury Compensation Program (VICP) is a federal program that was created to compensate people who may have been injured by certain vaccines. Visit the VICP website at GoldCloset.com.ee or call 305-814-5231 to learn about the program and about filing a claim. There is a time limit to file a claim for compensation. 7. How can I learn more?  Ask your healthcare provider.  Call your local or state health department.  Contact the Centers for Disease Control and Prevention (CDC): ? Call 205-458-6657 (1-800-CDC-INFO) or ? Visit CDC's https://gibson.com/ Vaccine Information Statement (Interim) Inactivated Influenza Vaccine (01/07/2018) This information is not intended to replace advice given to you by your health care provider. Make sure you discuss any questions you have with your health care provider. Document Revised: 08/31/2018 Document Reviewed: 01/11/2018 Elsevier Patient Education  [place].

## 2020-03-16 NOTE — Assessment & Plan Note (Addendum)
Highly suspect cystitis based off of HPI and UA today. Flank pain has resolved so lower suspicion for renal stone and acute pyelonephritis.  UA today with 3+ leuks, positive nitrites, 3+ blood.  No glucose. Culture sent.  It appears that she will now be treated for her third occurrence of cystitis within the last month.  No prior history.  Checking labs today, also abdominal plain films to rule out renal stone.  Treat with Macrobid twice daily x7 days. She will update me next week. Offered urology consultation given recurrent UTIs, she currently declines and would like to see how this treatment goes.

## 2020-03-16 NOTE — Progress Notes (Signed)
Subjective:    Patient ID: Theresa Lewis, female    DOB: [birth year], 60 y.o.   MRN: [id number]  HPI  This visit occurred during the SARS-CoV-2 public health emergency.  Safety protocols were in place, including screening questions prior to the visit, additional usage of staff PPE, and extensive cleaning of exam room while observing appropriate contact time as indicated for disinfecting solutions.   Ms. Schlotzhauer is a 59 year old female with a history of bleeding internal hemorrhoids, GERD, trigeminal neuralgia, acute cystitis with hematruia, antiphospholipid antibody syndrome on warfarin, prediabetes who presents today with a chief complaint of gross hematuria.   No history of acute cystitis until recently. No history of renal stones.  Evaluated and treated by Dr. Danise Mina on 01/31/20 for symptoms of right lower back pain, pelvic pain, increased urinary frequency, urgency, dysuria at the end of the stream. She endorsed starting a new body wash and limited water intake. UA that day with 3+ leuks, positive nitrites, 3+ blood. No culture obtained. Treated with cephalexin 500 mg BID for 7 days.  Repeat UA with Culture completed on 02/17/20 which revealed Klebsiella pneumoniae with resistance to Ampicillin. No antibiotics called in at the time.   Evaluated and treated by Dr. Elease Hashimoto on 02/20/20 for symptoms of urinary frequency, dysuria at the end of urination, gross hematuria with clots. UA that day with 2+ leuks, positive nitrites, 3+ blood. No culture obtained due to prior culture result on 02/17/20. Treated with Cipro 500 mg BID x 5 days.  She really didn't feel as though her symptoms from 09/07 to 09/29 resolved, but they did improve. Yesterday she began to notice increased urinary frequency, urgency, incomplete bladder emptying, bilateral flank pain. Today she's had two episodes of gross hematuria without clots and her flank pain has resolved.   She has increased intake of water, is using  mild body wash. She denies vaginal dryness or itching.   Review of Systems  Constitutional: Negative for fever.  Gastrointestinal: Negative for abdominal pain.  Genitourinary: Positive for dysuria, frequency, hematuria and urgency. Negative for flank pain, vaginal bleeding and vaginal discharge.       Past Medical History:  Diagnosis Date  . ABNORMAL VAGINAL BLEEDING 04/19/2007   Qualifier: Diagnosis of  By: Hulan Saas, CMA (AAMA), Quita Skye   . Deep venous thrombosis ([place])    2006    3 x clotting   . HEMORRHOIDS, WITH BLEEDING 12/26/2008   Qualifier: Diagnosis of  By: Regis Bill MD, Standley Brooking   . PE (pulmonary embolism) 07/09/2010  . Pulmonary embolism [facility])      Social History   Socioeconomic History  . Marital status: Married    Spouse name: Not on file  . Number of children: 1  . Years of education: Not on file  . Highest education level: Not on file  Occupational History  . Occupation: student advocate   Tobacco Use  . Smoking status: Never Smoker  . Smokeless tobacco: Never Used  Substance and Sexual Activity  . Alcohol use: Yes    Alcohol/week: 0.0 standard drinks    Comment: rarely  . Drug use: No  . Sexual activity: Not on file  Other Topics Concern  . Not on file  Social History Narrative   HH of 3  Daughter 35 and self.  hh of 2    No pets.   No ets.   No etoh.    Working for [employer] and [employer] .  45- 50 hours per  week.                Social Determinants of Health   Financial Resource Strain:   . Difficulty of Paying Living Expenses: Not on file  Food Insecurity:   . Worried About Charity fundraiser in the Last Year: Not on file  . Ran Out of Food in the Last Year: Not on file  Transportation Needs:   . Lack of Transportation (Medical): Not on file  . Lack of Transportation (Non-Medical): Not on file  Physical Activity:   . Days of Exercise per Week: Not on file  . Minutes of Exercise per Session: Not on file  Stress:   . Feeling of Stress : Not on  file  Social Connections:   . Frequency of Communication with Friends and Family: Not on file  . Frequency of Social Gatherings with Friends and Family: Not on file  . Attends Religious Services: Not on file  . Active Member of Clubs or Organizations: Not on file  . Attends Archivist Meetings: Not on file  . Marital Status: Not on file  Intimate Partner Violence:   . Fear of Current or Ex-Partner: Not on file  . Emotionally Abused: Not on file  . Physically Abused: Not on file  . Sexually Abused: Not on file    Past Surgical History:  Procedure Laterality Date  . MYOMECTOMY    . TUBAL LIGATION    . vena caval filter      Family History  Problem Relation Age of Onset  . Lung cancer Father 73       april 12   . Pancreatic cancer Father 11  . Hypertension Mother   . Rheum arthritis Mother   . Diabetes Paternal Aunt        x2  . Colon cancer Neg Hx   . Colon polyps Neg Hx   . Kidney disease Neg Hx   . Esophageal cancer Neg Hx   . Gallbladder disease Neg Hx   . Rectal cancer Neg Hx   . Stomach cancer Neg Hx     Allergies  Allergen Reactions  . Clindamycin/Lincomycin     "unknown"  . Tape Other (See Comments)    When pt has a band-aid on, the impression of band-aid stays on for a month    Current Outpatient Medications on File Prior to Visit  Medication Sig Dispense Refill  . AMBULATORY NON FORMULARY MEDICATION Medication Name: Nitroglycerin 0.125 mg apply rectally using a gloved finger up until the first knuckle three times a day for 8 weeks. 1 Tube 1  . CVS STOOL SOFTENER 100 MG capsule TAKE 1 CAPSULE BY MOUTH AT BEDTIME AS NEEDED 30 capsule 0  . gabapentin (NEURONTIN) 100 MG capsule Take 1 capsule by mouth three times weekly as needed. 90 capsule 0  . omeprazole (PRILOSEC) 20 MG capsule Take 1 capsule by mouth three times weekly. 90 capsule 0  . warfarin (COUMADIN) 5 MG tablet Take 1 1/2 tablets by mouth daily except take 2 tablets on Wed or as directed  by coumadin clinic. 120 tablet 1   No current facility-administered medications on file prior to visit.    BP 134/78   Pulse 60   Temp 98.6 F (37 C) (Temporal)   Ht 5\' 5"  (1.651 m)   Wt 219 lb (99.3 kg)   SpO2 98%   BMI 36.44 kg/m    Objective:   Physical Exam Constitutional:  General: She is not in acute distress.    Appearance: She is not ill-appearing.  Cardiovascular:     Rate and Rhythm: Normal rate and regular rhythm.  Pulmonary:     Effort: Pulmonary effort is normal.     Breath sounds: Normal breath sounds.  Abdominal:     General: Abdomen is flat. Bowel sounds are normal.     Palpations: Abdomen is soft.     Tenderness: There is no abdominal tenderness. There is no right CVA tenderness or left CVA tenderness.  Neurological:     Mental Status: She is alert.            Assessment & Plan:

## 2020-03-18 LAB — URINE CULTURE
MICRO NUMBER:: 11107372
SPECIMEN QUALITY:: ADEQUATE

## 2020-03-19 ENCOUNTER — Other Ambulatory Visit: Payer: Self-pay | Admitting: Primary Care

## 2020-03-19 DIAGNOSIS — N3001 Acute cystitis with hematuria: Secondary | ICD-10-CM

## 2020-03-21 DIAGNOSIS — N3090 Cystitis, unspecified without hematuria: Secondary | ICD-10-CM

## 2020-03-21 DIAGNOSIS — R319 Hematuria, unspecified: Secondary | ICD-10-CM

## 2020-03-23 ENCOUNTER — Encounter: Payer: Self-pay | Admitting: Urology

## 2020-03-23 ENCOUNTER — Ambulatory Visit (INDEPENDENT_AMBULATORY_CARE_PROVIDER_SITE_OTHER): Payer: BC Managed Care – PPO | Admitting: Urology

## 2020-03-23 ENCOUNTER — Other Ambulatory Visit: Payer: Self-pay

## 2020-03-23 VITALS — BP 138/74 | HR 84 | Ht 64.0 in | Wt 219.0 lb

## 2020-03-23 DIAGNOSIS — N3020 Other chronic cystitis without hematuria: Secondary | ICD-10-CM

## 2020-03-23 DIAGNOSIS — R310 Gross hematuria: Secondary | ICD-10-CM

## 2020-03-23 LAB — URINALYSIS, COMPLETE

## 2020-03-23 LAB — MICROSCOPIC EXAMINATION: RBC, Urine: >30 /hpf — AB (ref 0–2)

## 2020-03-23 MED ORDER — NITROFURANTOIN MONOHYD MACRO 100 MG PO CAPS: 100.0000 mg | ORAL_CAPSULE | Freq: Every day | ORAL | 0 refills | Status: DC

## 2020-03-23 NOTE — Progress Notes (Signed)
03/23/2020 1:24 PM   Theresa Lewis 24-Dec-1960 [id number]  Referring provider: Pleas Koch, NP [place],  [place] [address]  Chief Complaint  Patient presents with  . Hematuria    HPI:  Theresa Lewis was referred over for gross hematuria with clots that started September 2021. She complained of gross hematuria.  She had frequency urgency and dysuria.  A September 2021 urine culture grew Klebsiella.  A 03/16/2020 UA dip positive for leukocyte esterase and nitrite and culture positive for E. coli.  Her white count was 8 and creatinine 1.09.  She had a KUB done which was normal. Abx did not help.   She smoked for just a couple years around HS and college. 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 No other exposures.   Hgb stable on 10/22. She feels tired. She has some terminal dysuria and passes some clots. No Hx. No gyn appt and hasnt noticed vaginal bleeding.   She is on coumadin for DVT prophylaxis and has a IVC filter.    PMH: Past Medical History:  Diagnosis Date  . ABNORMAL VAGINAL BLEEDING 04/19/2007   Qualifier: Diagnosis of  By: Hulan Saas, CMA (AAMA), Quita Skye   . Deep venous thrombosis ([place])    2006    3 x clotting   . HEMORRHOIDS, WITH BLEEDING 12/26/2008   Qualifier: Diagnosis of  By: Regis Bill MD, Standley Brooking   . PE (pulmonary embolism) 07/09/2010  . Pulmonary embolism [facility])     Surgical History: Past Surgical History:  Procedure Laterality Date  . MYOMECTOMY    . TUBAL LIGATION    . vena caval filter      Home Medications:  Allergies as of 03/23/2020      Reactions   Clindamycin/lincomycin    "unknown"   Tape Other (See Comments)   When pt has a band-aid on, the impression of band-aid stays on for a month      Medication List       Accurate as of March 23, 2020  1:24 PM. If you have any questions, ask your nurse or doctor.        AMBULATORY NON FORMULARY MEDICATION Medication Name: Nitroglycerin 0.125 mg apply rectally using a gloved  finger up until the first knuckle three times a day for 8 weeks.   CVS Stool Softener 100 MG capsule Generic drug: docusate sodium TAKE 1 CAPSULE BY MOUTH AT BEDTIME AS NEEDED   gabapentin 100 MG capsule Commonly known as: NEURONTIN Take 1 capsule by mouth three times weekly as needed.   nitrofurantoin (macrocrystal-monohydrate) 100 MG capsule Commonly known as: Macrobid Take 1 capsule (100 mg total) by mouth 2 (two) times daily for 7 days.   omeprazole 20 MG capsule Commonly known as: PRILOSEC Take 1 capsule by mouth three times weekly.   warfarin 5 MG tablet Commonly known as: COUMADIN Take as directed by the anticoagulation clinic. If you are unsure how to take this medication, talk to your nurse or doctor. Original instructions: Take 1 1/2 tablets by mouth daily except take 2 tablets on Wed or as directed by coumadin clinic.       Allergies:  Allergies  Allergen Reactions  . Clindamycin/Lincomycin     "unknown"  . Tape Other (See Comments)    When pt has a band-aid on, the impression of band-aid stays on for a month    Family History: Family History  Problem Relation Age of Onset  . Lung cancer Father 74  april 12   . Pancreatic cancer Father 78  . Hypertension Mother   . Rheum arthritis Mother   . Diabetes Paternal Aunt        x2  . Colon cancer Neg Hx   . Colon polyps Neg Hx   . Kidney disease Neg Hx   . Esophageal cancer Neg Hx   . Gallbladder disease Neg Hx   . Rectal cancer Neg Hx   . Stomach cancer Neg Hx     Social History:  reports that she has never smoked. She has never used smokeless tobacco. She reports current alcohol use. She reports that she does not use drugs.   Physical Exam: BP 138/74   Pulse 84   Ht 5\' 4"  (1.626 m)   Wt 219 lb (99.3 kg)   BMI 37.59 kg/m   Constitutional:  Alert and oriented, No acute distress. HEENT: [place] AT, moist mucus membranes.  Trachea midline, no masses. Cardiovascular: No clubbing, cyanosis, or  edema. Respiratory: Normal respiratory effort, no increased work of breathing. GI: Abdomen is soft, nontender, nondistended, no abdominal masses Skin: No rashes, bruises or suspicious lesions. Neurologic: Grossly intact, no focal deficits, moving all 4 extremities. Psychiatric: Normal mood and affect.  Laboratory Data: Lab Results  Component Value Date   WBC 4.8 03/16/2020   HGB 12.1 03/16/2020   HCT 38.2 03/16/2020   MCV 79.5 03/16/2020   PLT 330.0 03/16/2020    Lab Results  Component Value Date   CREATININE 1.09 03/16/2020    No results found for: PSA  No results found for: TESTOSTERONE  Lab Results  Component Value Date   HGBA1C 6.1 03/16/2020    Urinalysis    Component Value Date/Time   COLORURINE yellow 10/04/2007 1050   APPEARANCEUR Clear 10/04/2007 1050   LABSPEC 1.025 10/04/2007 1050   PHURINE 5.5 10/04/2007 1050   HGBUR trace-lysed 10/04/2007 1050   BILIRUBINUR neg 03/16/2020 1235   PROTEINUR Positive (A) 03/16/2020 1235   UROBILINOGEN 2.0 (A) 03/16/2020 1235   UROBILINOGEN 0.2 10/04/2007 1050   NITRITE pos 03/16/2020 1235   NITRITE negative 10/04/2007 1050   LEUKOCYTESUR Large (3+) (A) 03/16/2020 1235    No results found for: LABMICR, WBCUA, RBCUA, LABEPIT, MUCUS, BACTERIA  Pertinent Imaging: KUB No results found for this or any previous visit.  No results found for this or any previous visit.  No results found for this or any previous visit.  No results found for this or any previous visit.  No results found for this or any previous visit.  No results found for this or any previous visit.  No results found for this or any previous visit.  No results found for this or any previous visit.   Assessment & Plan:    1. Chronic cystitis Send for urine for cx/ She completes BID NF today, so I will start a nightly NF.  - Urinalysis, Complete  2. Hematuria, gross -  Check CT scan ASAP and cystoscopy. ED precautions given.  - Urinalysis,  Complete   No follow-ups on file.  Festus Aloe, MD  [facility] [address] [place], [place] [address] [phone]

## 2020-03-23 NOTE — Patient Instructions (Signed)

## 2020-03-29 ENCOUNTER — Other Ambulatory Visit: Payer: Self-pay

## 2020-03-29 ENCOUNTER — Ambulatory Visit: Payer: BC Managed Care – PPO

## 2020-03-29 DIAGNOSIS — Z7901 Long term (current) use of anticoagulants: Secondary | ICD-10-CM

## 2020-03-29 LAB — POCT INR: INR: 1.4 — AB (ref 2.0–3.0)

## 2020-03-29 NOTE — Patient Instructions (Addendum)
Pre visit review using our clinic review tool, if applicable. No additional management support is needed unless otherwise documented below in the visit note.  Take three tablets (15 mg) today. Then take two tablets (10 mg) tomorrow. Then resume taking 7.5 mg daily, except take 10 mg on Wednesdays. Return on 11/10 for re-check.

## 2020-03-30 ENCOUNTER — Ambulatory Visit: Admission: RE | Admit: 2020-03-30 | Payer: BC Managed Care – PPO | Source: Ambulatory Visit

## 2020-04-01 ENCOUNTER — Other Ambulatory Visit: Payer: Self-pay | Admitting: Primary Care

## 2020-04-01 DIAGNOSIS — D6869 Other thrombophilia: Secondary | ICD-10-CM

## 2020-04-01 DIAGNOSIS — Z86718 Personal history of other venous thrombosis and embolism: Secondary | ICD-10-CM

## 2020-04-02 ENCOUNTER — Telehealth: Payer: Self-pay | Admitting: *Deleted

## 2020-04-02 NOTE — Telephone Encounter (Signed)
Pt calling to let us know she passed a huge clot last night, and she felt very weak afterwards. Pt stated she would be going out of town this week and wanted to know if you wanted to extend the abx? Pt does take warfarin. Please advise

## 2020-04-02 NOTE — Telephone Encounter (Signed)
Contacted patient to offer patient an appt with PA per Dr Junious Silk.  Patient would like to be seen, added her to Wednesdays schedule with [facility]. Patient reports passing two large clots today, one of which was very large and painful. Patient reports this has been ongoing for 2 months. Advised patient to go to ED if her pain progresses or if she feels she needs to be seen sooner than Wednesday. Patient also reports not having met her deductible for the year resulting in her CT scan costing her over $1600 out of pocket thus forcing patient to cancel her CT scan.

## 2020-04-03 NOTE — H&P (View-Only) (Signed)
04/04/2020 11:59 AM   Theresa Lewis Oct 19, 1960 [id number]  Referring provider: Pleas Koch, NP [place],  [place] [address]  Chief Complaint  Patient presents with  . Hematuria    HPI: Theresa Lewis is a 59 y.o. female with high risk hematuria who presents today complaining of gross hematuria and clots.    She was seen by Dr. Junious Lewis in October 2021 for the same complaints.  It was recommended that she undergo CT urogram which she did not pursue due to cost and has a cystoscopy with Dr. Junious Lewis on the 24th.    She smoked for just a couple years around HS and college. 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 No other exposures.   Hgb stable on 10/22. She feels tired. She has some terminal dysuria and passes some clots. No Hx. No gyn appt and hasnt noticed vaginal bleeding.   She is on coumadin for DVT prophylaxis and has a IVC filter.   She continues to pass large blood clots through the urinary tract which are painful for her.  She is also having urinary frequency, urgency, postvoid dribbling and a weak urinary stream.   Her UA today is red cloudy. Her PVR is 24 mL.  Patient denies any modifying or aggravating factors.  Patient denies any fevers, chills, nausea or vomiting.   She has acquired funding for the CT urogram and would like to proceed with that as soon as possible.  PMH: Past Medical History:  Diagnosis Date  . ABNORMAL VAGINAL BLEEDING 04/19/2007   Qualifier: Diagnosis of  By: Theresa Lewis, CMA (AAMA), Theresa Lewis   . Deep venous thrombosis ([place])    2006    3 x clotting   . HEMORRHOIDS, WITH BLEEDING 12/26/2008   Qualifier: Diagnosis of  By: Theresa Bill MD, Theresa Lewis   . PE (pulmonary embolism) 07/09/2010  . Pulmonary embolism [facility])     Surgical History: Past Surgical History:  Procedure Laterality Date  . MYOMECTOMY    . TUBAL LIGATION    . vena caval filter      Home Medications:  Allergies as of 04/04/2020      Reactions    Clindamycin/lincomycin    "unknown"   Tape Other (See Comments)   When pt has a band-aid on, the impression of band-aid stays on for a month      Medication List       Accurate as of April 04, 2020 11:59 PM. If you have any questions, ask your nurse or doctor.        AMBULATORY NON FORMULARY MEDICATION Medication Name: Nitroglycerin 0.125 mg apply rectally using a gloved finger up until the first knuckle three times a day for 8 weeks.   cephALEXin 500 MG capsule Commonly known as: KEFLEX Take 1 capsule (500 mg total) by mouth 2 (two) times daily for 7 days. Started by: Theresa Council, PA-C   [employer] Stool Softener 100 MG capsule Generic drug: docusate sodium TAKE 1 CAPSULE BY MOUTH AT BEDTIME AS NEEDED   gabapentin 100 MG capsule Commonly known as: NEURONTIN Take 1 capsule by mouth three times weekly as needed.   nitrofurantoin (macrocrystal-monohydrate) 100 MG capsule Commonly known as: MACROBID Take 1 capsule (100 mg total) by mouth at bedtime.   omeprazole 20 MG capsule Commonly known as: PRILOSEC Take 1 capsule by mouth three times weekly.   warfarin 5 MG tablet Commonly known as: COUMADIN Take as directed by the anticoagulation clinic. If you  are unsure how to take this medication, talk to your nurse or doctor. Original instructions: TAKE 1 1/2 TABLETS BY MOUTH DAILY EXCEPT TAKE 2 TABLETS ON WED OR AS DIRECTED BY COUMADIN CLINIC.       Allergies:  Allergies  Allergen Reactions  . Clindamycin/Lincomycin     "unknown"  . Tape Other (See Comments)    When pt has a band-aid on, the impression of band-aid stays on for a month    Family History: Family History  Problem Relation Age of Onset  . Lung cancer Father 73       april 12   . Pancreatic cancer Father 26  . Hypertension Mother   . Rheum arthritis Mother   . Diabetes Paternal Aunt        x2  . Colon cancer Neg Hx   . Colon polyps Neg Hx   . Kidney disease Neg Hx   . Esophageal cancer Neg Hx   .  Gallbladder disease Neg Hx   . Rectal cancer Neg Hx   . Stomach cancer Neg Hx     Social History:  reports that she has never smoked. She has never used smokeless tobacco. She reports current alcohol use. She reports that she does not use drugs.   Physical Exam: BP 139/82 (BP Location: Left Arm, Patient Position: Sitting, Cuff Size: Normal)   Pulse 65   Ht 5\' 5"  (1.651 m)   Wt 219 lb (99.3 kg)   BMI 36.44 kg/m   Constitutional:  Well nourished. Alert and oriented, No acute distress. HEENT: [place] AT, mask in place.  Trachea midline Cardiovascular: No clubbing, cyanosis, or edema. Respiratory: Normal respiratory effort, no increased work of breathing. Neurologic: Grossly intact, no focal deficits, moving all 4 extremities. Psychiatric: Normal mood and affect.   Laboratory Data: Lab Results  Component Value Date   WBC 4.8 03/16/2020   HGB 12.1 03/16/2020   HCT 38.2 03/16/2020   MCV 79.5 03/16/2020   PLT 330.0 03/16/2020    Lab Results  Component Value Date   CREATININE 1.09 03/16/2020    Lab Results  Component Value Date   HGBA1C 6.1 03/16/2020    Urinalysis Component     Latest Ref Rng & Units 04/04/2020  Specific Gravity, UA     1.005 - 1.030 1.020  pH, UA     5.0 - 7.5 5.0  Color, UA     Yellow Red (A)  Appearance Ur     Clear Cloudy (A)  Protein,UA      CANCELED  Glucose, UA      CANCELED  Ketones, UA      CANCELED  Microscopic Examination      See below:   Component     Latest Ref Rng & Units 04/04/2020  WBC, UA     0 - 5 /hpf 6-10 (A)  RBC     0 - 2 /hpf >30 (A)  Epithelial Cells (non renal)     0 - 10 /hpf 0-10  Bacteria, UA     None seen/Few Few   I have reviewed the labs.  Pertinent Imaging: Results for Theresa, Lewis "STAR" (MRN [id number]) as of 04/11/2020 11:51  Ref. Range 04/04/2020 11:01  Scan Result Unknown 72mL    Assessment & Plan:    1. Gross hematuria We will go ahead and get her CT urogram scheduled next week to  begin the process of her hematuria work-up and so the results can be available for her  cystoscopy on the 24th with Dr. Junious Lewis UA Urine culture She is started on Keflex in the interim and will adjust if necessary once urine cultures are available   Return for Schedule CT urogram ASAP.  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  Dr. Junious Lewis and patient were notified of results.  I discussed with patient that the next step would be to get scheduled for transurethral resection of the bladder tumor.  I explained how the procedure is performed and the risks involved such as infection, bleeding, damage to the bladder and/or ureter and the possibility to return to the operating room a few weeks after the initial TURBT for further biopsies.  I also advised her that she will likely be going home with a catheter in place and she may even spend the night in the hospital after the procedure to monitor her for bleeding as she is on anticoagulants.  I also advised her that we need to contact her PCP to obtain clearance for her to discontinue the Coumadin prior to the procedure.

## 2020-04-03 NOTE — Progress Notes (Signed)
04/04/2020 11:59 AM   Theresa Lewis 23-Dec-1960 [id number]  Referring provider: Pleas Koch, NP [place],  [place] [address]  Chief Complaint  Patient presents with  . Hematuria    HPI: Theresa Lewis is a 59 y.o. female with high risk hematuria who presents today complaining of gross hematuria and clots.    She was seen by Dr. Junious Silk in October 2021 for the same complaints.  It was recommended that she undergo CT urogram which she did not pursue due to cost and has a cystoscopy with Dr. Junious Silk on the 24th.    She smoked for just a couple years around HS and college. 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 No other exposures.   Hgb stable on 10/22. She feels tired. She has some terminal dysuria and passes some clots. No Hx. No gyn appt and hasnt noticed vaginal bleeding.   She is on coumadin for DVT prophylaxis and has a IVC filter.   She continues to pass large blood clots through the urinary tract which are painful for her.  She is also having urinary frequency, urgency, postvoid dribbling and a weak urinary stream.   Her UA today is red cloudy. Her PVR is 24 mL.  Patient denies any modifying or aggravating factors.  Patient denies any fevers, chills, nausea or vomiting.   She has acquired funding for the CT urogram and would like to proceed with that as soon as possible.  PMH: Past Medical History:  Diagnosis Date  . ABNORMAL VAGINAL BLEEDING 04/19/2007   Qualifier: Diagnosis of  By: Hulan Saas, CMA (AAMA), Q[place]a Skye   . Deep venous thrombosis ([place])    2006    3 x clotting   . HEMORRHOIDS, WITH BLEEDING 12/26/2008   Qualifier: Diagnosis of  By: Regis Bill MD, Standley Brooking   . PE (pulmonary embolism) 07/09/2010  . Pulmonary embolism [facility])     Surgical History: Past Surgical History:  Procedure Laterality Date  . MYOMECTOMY    . TUBAL LIGATION    . vena caval filter      Home Medications:  Allergies as of 04/04/2020      Reactions    Clindamycin/lincomycin    "unknown"   Tape Other (See Comments)   When pt has a band-aid on, the impression of band-aid stays on for a month      Medication List       Accurate as of April 04, 2020 11:59 PM. If you have any questions, ask your nurse or doctor.        AMBULATORY NON FORMULARY MEDICATION Medication Name: Nitroglycerin 0.125 mg apply rectally using a gloved finger up until the first knuckle three times a day for 8 weeks.   cephALEXin 500 MG capsule Commonly known as: KEFLEX Take 1 capsule (500 mg total) by mouth 2 (two) times daily for 7 days. Started by: Zara Council, PA-C   [employer] Stool Softener 100 MG capsule Generic drug: docusate sodium TAKE 1 CAPSULE BY MOUTH AT BEDTIME AS NEEDED   gabapentin 100 MG capsule Commonly known as: NEURONTIN Take 1 capsule by mouth three times weekly as needed.   nitrofurantoin (macrocrystal-monohydrate) 100 MG capsule Commonly known as: MACROBID Take 1 capsule (100 mg total) by mouth at bedtime.   omeprazole 20 MG capsule Commonly known as: PRILOSEC Take 1 capsule by mouth three times weekly.   warfarin 5 MG tablet Commonly known as: COUMADIN Take as directed by the anticoagulation clinic. If you  are unsure how to take this medication, talk to your nurse or doctor. Original instructions: TAKE 1 1/2 TABLETS BY MOUTH DAILY EXCEPT TAKE 2 TABLETS ON WED OR AS DIRECTED BY COUMADIN CLINIC.       Allergies:  Allergies  Allergen Reactions  . Clindamycin/Lincomycin     "unknown"  . Tape Other (See Comments)    When pt has a band-aid on, the impression of band-aid stays on for a month    Family History: Family History  Problem Relation Age of Onset  . Lung cancer Father 73       april 12   . Pancreatic cancer Father 84  . Hypertension Mother   . Rheum arthritis Mother   . Diabetes Paternal Aunt        x2  . Colon cancer Neg Hx   . Colon polyps Neg Hx   . Kidney disease Neg Hx   . Esophageal cancer Neg Hx   .  Gallbladder disease Neg Hx   . Rectal cancer Neg Hx   . Stomach cancer Neg Hx     Social History:  reports that she has never smoked. She has never used smokeless tobacco. She reports current alcohol use. She reports that she does not use drugs.   Physical Exam: BP 139/82 (BP Location: Left Arm, Patient Position: Sitting, Cuff Size: Normal)   Pulse 65   Ht 5\' 5"  (1.651 m)   Wt 219 lb (99.3 kg)   BMI 36.44 kg/m   Constitutional:  Well nourished. Alert and oriented, No acute distress. HEENT: [place] AT, mask in place.  Trachea midline Cardiovascular: No clubbing, cyanosis, or edema. Respiratory: Normal respiratory effort, no increased work of breathing. Neurologic: Grossly intact, no focal deficits, moving all 4 extremities. Psychiatric: Normal mood and affect.   Laboratory Data: Lab Results  Component Value Date   WBC 4.8 03/16/2020   HGB 12.1 03/16/2020   HCT 38.2 03/16/2020   MCV 79.5 03/16/2020   PLT 330.0 03/16/2020    Lab Results  Component Value Date   CREATININE 1.09 03/16/2020    Lab Results  Component Value Date   HGBA1C 6.1 03/16/2020    Urinalysis Component     Latest Ref Rng & Units 04/04/2020  Specific Gravity, UA     1.005 - 1.030 1.020  pH, UA     5.0 - 7.5 5.0  Color, UA     Yellow Red (A)  Appearance Ur     Clear Cloudy (A)  Protein,UA      CANCELED  Glucose, UA      CANCELED  Ketones, UA      CANCELED  Microscopic Examination      See below:   Component     Latest Ref Rng & Units 04/04/2020  WBC, UA     0 - 5 /hpf 6-10 (A)  RBC     0 - 2 /hpf >30 (A)  Epithelial Cells (non renal)     0 - 10 /hpf 0-10  Bacteria, UA     None seen/Few Few   I have reviewed the labs.  Pertinent Imaging: Results for Theresa Lewis, Theresa Lewis "STAR" (MRN [id number]) as of 04/11/2020 11:51  Ref. Range 04/04/2020 11:01  Scan Result Unknown 18mL    Assessment & Plan:    1. Gross hematuria We will go ahead and get her CT urogram scheduled next week to  begin the process of her hematuria work-up and so the results can be available for her  cystoscopy on the 24th with Dr. Junious Silk UA Urine culture She is started on Keflex in the interim and will adjust if necessary once urine cultures are available   Return for Schedule CT urogram ASAP.  Zara Council, PA-C  [place] [address], [place], [place] [address] [phone]  Addendum: A enhancing mass arising from the posterior and right lateral wall of the bladder measuring 5 cm extending over the right UVJ but not obstructing at this time was found on the CT urogram dated April 10, 2020.  Dr. Junious Silk and patient were notified of results.  I discussed with patient that the next step would be to get scheduled for transurethral resection of the bladder tumor.  I explained how the procedure is performed and the risks involved such as infection, bleeding, damage to the bladder and/or ureter and the possibility to return to the operating room a few weeks after the initial TURBT for further biopsies.  I also advised her that she will likely be going home with a catheter in place and she may even spend the night in the hospital after the procedure to monitor her for bleeding as she is on anticoagulants.  I also advised her that we need to contact her PCP to obtain clearance for her to discontinue the Coumadin prior to the procedure.

## 2020-04-04 ENCOUNTER — Ambulatory Visit (INDEPENDENT_AMBULATORY_CARE_PROVIDER_SITE_OTHER): Payer: BC Managed Care – PPO

## 2020-04-04 ENCOUNTER — Ambulatory Visit (INDEPENDENT_AMBULATORY_CARE_PROVIDER_SITE_OTHER): Payer: BC Managed Care – PPO | Admitting: Urology

## 2020-04-04 ENCOUNTER — Other Ambulatory Visit: Payer: Self-pay

## 2020-04-04 VITALS — BP 139/82 | HR 65 | Ht 65.0 in | Wt 219.0 lb

## 2020-04-04 DIAGNOSIS — R310 Gross hematuria: Secondary | ICD-10-CM

## 2020-04-04 DIAGNOSIS — Z7901 Long term (current) use of anticoagulants: Secondary | ICD-10-CM | POA: Diagnosis not present

## 2020-04-04 LAB — POCT INR: INR: 2.7 (ref 2.0–3.0)

## 2020-04-04 LAB — BLADDER SCAN AMB NON-IMAGING

## 2020-04-04 MED ORDER — CEPHALEXIN 500 MG PO CAPS: 500.0000 mg | ORAL_CAPSULE | Freq: Two times a day (BID) | ORAL | 0 refills | Status: AC

## 2020-04-04 NOTE — Patient Instructions (Addendum)
Pre visit review using our clinic review tool, if applicable. No additional management support is needed unless otherwise documented below in the visit note.   Resume taking 7.5 mg daily, except take 10 mg on Wednesdays. Return in 4 weeks for re-check.

## 2020-04-05 LAB — MICROSCOPIC EXAMINATION: RBC, Urine: >30 /hpf — AB (ref 0–2)

## 2020-04-05 LAB — URINALYSIS, COMPLETE
Specific Gravity, UA: 1.02 (ref 1.005–1.030)
pH, UA: 5 (ref 5.0–7.5)

## 2020-04-07 LAB — CULTURE, URINE COMPREHENSIVE

## 2020-04-10 ENCOUNTER — Telehealth: Payer: Self-pay | Admitting: Urology

## 2020-04-10 ENCOUNTER — Ambulatory Visit
Admission: RE | Admit: 2020-04-10 | Discharge: 2020-04-10 | Disposition: A | Discharge status: Home / Self Care | Payer: BC Managed Care – PPO | Source: Ambulatory Visit | Attending: Urology | Admitting: Urology

## 2020-04-10 ENCOUNTER — Other Ambulatory Visit: Payer: Self-pay

## 2020-04-10 DIAGNOSIS — R310 Gross hematuria: Secondary | ICD-10-CM | POA: Insufficient documentation

## 2020-04-10 MED ORDER — IOHEXOL 300 MG/ML  SOLN
125.0000 mL | Freq: Once | INTRAMUSCULAR | Status: AC | PRN
  Administered 2020-04-10: 125 mL via INTRAVENOUS

## 2020-04-10 NOTE — Telephone Encounter (Signed)
I have spoken to Theresa Lewis concerning her CT scan results and the finding of the bladder mass.  I explained to her that the next steps will be a TURBT so that we can remove the tumor and also stage the tumor for treatment plan.  We will contact her primary care doctor so that we can get clearance to stop the Coumadin and get her scheduled ASAP.

## 2020-04-11 ENCOUNTER — Telehealth: Payer: Self-pay

## 2020-04-11 ENCOUNTER — Encounter: Payer: Self-pay | Admitting: Urology

## 2020-04-11 ENCOUNTER — Other Ambulatory Visit: Payer: Self-pay | Admitting: Radiology

## 2020-04-11 DIAGNOSIS — N3289 Other specified disorders of bladder: Secondary | ICD-10-CM

## 2020-04-11 NOTE — Telephone Encounter (Signed)
-----   Message from Pleas Koch, NP sent at 04/11/2020 10:00 AM EST ----- Regarding: Coumadin [place],   This patient needs to come off of coumadin ASAP for evaluation of new bladder mass. I'm attaching Zara Council, PA who is helping to coordinate care at [facility].   Thank you! Anda Kraft

## 2020-04-11 NOTE — Telephone Encounter (Signed)
Patient is currently on coumadin therapy for history of DVT, PE and antiphospholipid antibody syndrome.  She has no recent hx of embolism and no CVA or prosthetic valve history.  Typically okay for 5 day hold without Lovenox.   Note: pts routine coumadin dosing now is to take 7.5mg  daily except take 10mg  on Wednesdays.   Could bring pt into office on 04/17/20 to check INR if Zara Council or surgeon prefers to have it checked before pt holds coumadin.     Propose the following for holding coumadin for the procedure on 04/24/20:  11/25(Thurs):  Last dose of Coumadin 11/26(Fri)-11/30(Tues):  HOLD COUMADIN 11/30(Tues):  PROCEDURE-NO coumadin 12/1(Wed):  Restart coumadin- take 15mg  (3 tabs) 12/2(Thurs):  Coumadin 15mg  (3 tabs) 12/3(Fri):  Coumadin 10mg  (2 tabs) 12/4(Sat):  Coumadin 7.5mg  (1 and 1/2 tab) 12/5(Sun):  Coumadin 7.5mg  (1 and 1/2 tab) 12/6(Mon):  Recheck INR

## 2020-04-11 NOTE — Telephone Encounter (Signed)
Conversation with Zara Council, PA, with [place], below  [place], RN: Advised of proposed dosing schedule and testing dates.   Laren[place], PA: Can pt's coumadin be help on 11/25 also?  Larene Beach, RN: yes it can be held on 11/25 also.  Larene [place], PA: Is it possible to hold pt's coumadin after the procedure possible due to bleeding. This will have TBD, but possible 3 days or more. She will likely be on continuous bladder irrigation for the bleeding.   Larene Beach, RN: yes, you will need to keep PCP and myself informed so coumadin can be restarted as soon as safe to do so.   Larene Beach, PA: so to recap, pt will take last dose of coumadin on 11/24 and will hold coumadin from 11/25-11/30 and then restart coumadin when okay from Dr. Bernardo Heater and contact Anda Kraft, NP.   Larene Beach, RN: this is correct. I will contact pt this afternoon.    New proposed dosing schedule;  11/24(Wed): Last dose of coumadin 11/25(Thurs)-11/30(Tues): HOLD COUMADIN 11/30(Tues): PROCEDURE- NO Coumadin  HOLD ON COUMADIN AFTER PROCEDURE UNTIL NOTIFIED BY DR. STOIOFF'S OFFICE.  Days after procedure Dr. Dene Gentry office will contact Allie Bossier, NP and this nurse with updates on pt's condition and when coumadin can be restarted.

## 2020-04-11 NOTE — Telephone Encounter (Signed)
Noted and agree. 

## 2020-04-11 NOTE — Telephone Encounter (Signed)
Form received for Surgical clearance. Placed in your folder for review.  Last appointment in office 03/16/2020 No EKG in file

## 2020-04-11 NOTE — Telephone Encounter (Signed)
Theresa Lewis, thank you for your work on this. I believe there has been a change to this proposed regimen, correct?

## 2020-04-11 NOTE — Telephone Encounter (Signed)
Contacted pt and advised apt was needed to check INR on 11/23. Pt agreed to 930. Advised if anything is needed to contact office. Pt appreciative and verbalized understanding.

## 2020-04-12 NOTE — Telephone Encounter (Signed)
We need to see her for surgical clearance, no recent ECG on file. Theresa Lewis, can we add her in when she comes to see you on 11/23?

## 2020-04-13 ENCOUNTER — Other Ambulatory Visit: Payer: BC Managed Care – PPO

## 2020-04-13 ENCOUNTER — Other Ambulatory Visit: Payer: Self-pay

## 2020-04-13 ENCOUNTER — Other Ambulatory Visit: Payer: Self-pay | Admitting: Radiology

## 2020-04-13 DIAGNOSIS — N3289 Other specified disorders of bladder: Secondary | ICD-10-CM

## 2020-04-13 NOTE — Telephone Encounter (Signed)
Pt scheduled for 840 on 11/23

## 2020-04-16 ENCOUNTER — Encounter
Admission: RE | Admit: 2020-04-16 | Discharge: 2020-04-16 | Disposition: A | Discharge status: Home / Self Care | Payer: BC Managed Care – PPO | Source: Ambulatory Visit | Attending: Urology | Admitting: Urology

## 2020-04-16 ENCOUNTER — Other Ambulatory Visit: Payer: Self-pay

## 2020-04-16 HISTORY — DX: Other complications of anesthesia, initial encounter: T88.59XA

## 2020-04-16 HISTORY — DX: Gastro-esophageal reflux disease without esophagitis: K21.9

## 2020-04-16 HISTORY — DX: Prediabetes: R73.03

## 2020-04-16 NOTE — Patient Instructions (Signed)
Your procedure is scheduled on: 04/24/20 Report to [facility] AFTER CHECK IN AT [facility]. To find out your arrival time please call [phone] between 1PM - 3PM on 04/23/20.  Remember: Instructions that are not followed completely may result in serious medical risk, up to and including death, or upon the discretion of your surgeon and anesthesiologist your surgery may need to be rescheduled.     _X__ 1. Do not eat food after midnight the night before your procedure.                 No gum chewing or hard candies. You may drink clear liquids up to 2 hours                 before you are scheduled to arrive for your surgery- DO not drink clear                 liquids within 2 hours of the start of your surgery.                 Clear Liquids include:  water, apple juice without pulp, clear carbohydrate                 drink such as Clearfast or Gatorade, Black Coffee or Tea (Do not add                 anything to coffee or tea). Diabetics water only  __X__2.  On the morning of surgery brush your teeth with toothpaste and water, you                 may rinse your mouth with mouthwash if you wish.  Do not swallow any              toothpaste of mouthwash.     _X__ 3.  No Alcohol for 24 hours before or after surgery.   _X__ 4.  Do Not Smoke or use e-cigarettes For 24 Hours Prior to Your Surgery.                 Do not use any chewable tobacco products for at least 6 hours prior to                 surgery.  ____  5.  Bring all medications with you on the day of surgery if instructed.   __X__  6.  Notify your doctor if there is any change in your medical condition      (cold, fever, infections).     Do not wear jewelry, make-up, hairpins, clips or nail polish. Do not wear lotions, powders, or perfumes.  Do not shave 48 hours prior to surgery. Men may shave face and neck. Do not bring valuables to the hospital.    [facility] is  not responsible for any belongings or valuables.  Contacts, dentures/partials or body piercings may not be worn into surgery. Bring a case for your contacts, glasses or hearing aids, a denture cup will be supplied. Leave your suitcase in the car. After surgery it may be brought to your room. For patients admitted to the hospital, discharge time is determined by your treatment team.   Patients discharged the day of surgery will not be allowed to drive home.   Please read over the following fact sheets that you were given:   [place]  __X__ Take these medicines the morning of  surgery with A SIP OF WATER:    1. omeprazole (PRILOSEC) 20 MG capsule  2.   3.   4.  5.  6.  ____ Fleet Enema (as directed)   ____ Use CHG Soap/SAGE wipes as directed  ____ Use inhalers on the day of surgery  ____ Stop metformin/Janumet/Farxiga 2 days prior to surgery    ____ Take 1/2 of usual insulin dose the night before surgery. No insulin the morning          of surgery.   __X__ Stop Blood Thinners Coumadin/Plavix/Xarelto/Pleta/Pradaxa/Eliquis/Effient/Aspirin  on   Or contact your Surgeon, Cardiologist or Medical Doctor regarding  ability to stop your blood thinners  FOLLOW INSTRUCTIONS PROVIDED BY YOUR PRIMARY PHYSICIAN/NURSE PRACTITIONER  __X__ Stop Anti-inflammatories 7 days before surgery such as Advil, Ibuprofen, Motrin,  BC or Goodies Powder, Naprosyn, Naproxen, Aleve, Aspirin    __X__ Stop all herbal supplements, fish oil or vitamin E until after surgery.    ____ Bring C-Pap to the hospital.      Indwelling Urinary Catheter Care, Adult An indwelling urinary catheter is a thin, flexible, germ-free (sterile) tube that is placed into the bladder to help drain urine out of the body. The catheter is inserted into the part of the body that drains urine from the bladder (urethra). Urine drains from the catheter into a drainage bag outside of the body. Taking good care of your  catheter will keep it working properly and help to prevent problems from developing. What are the risks?  Bacteria may get into your bladder and cause a urinary tract infection.  Urine flow can become blocked. This can happen if the catheter is not working correctly, or if you have sediment or a blood clot in your bladder or the catheter.  Tissue near the catheter may become irritated and bleed. How to wear your catheter and your drainage bag Supplies needed  Adhesive tape or a leg strap.  Alcohol wipe or soap and water (if you use tape).  A clean towel (if you use tape).  Overnight drainage bag.  Smaller drainage bag (leg bag). Wearing your catheter and bag Use adhesive tape or a leg strap to attach your catheter to your leg.  Make sure the catheter is not pulled tight.  If a leg strap gets wet, replace it with a dry one.  If you use adhesive tape: 1. Use an alcohol wipe or soap and water to wash off any stickiness on your skin where you had tape before. 2. Use a clean towel to pat-dry the area. 3. Apply the new tape. You should have received a large overnight drainage bag and a smaller leg bag that fits underneath clothing.  You may wear the overnight bag at any time, but you should not wear the leg bag at night.  Always wear the leg bag below your knee.  Make sure the overnight drainage bag is always lower than the level of your bladder, but do not let it touch the floor. Before you go to sleep, hang the bag inside a wastebasket that is covered by a clean plastic bag. How to care for your skin around the catheter     Supplies needed  A clean washcloth.  Water and mild soap.  A clean towel. Caring for your skin and catheter  Every day, use a clean washcloth and soapy water to clean the skin around your catheter. 1. Wash your hands with soap and water. 2. Wet a washcloth in warm water  and mild soap. 3. Clean the skin around your urethra.  If you are  female:  Use one hand to gently spread the folds of skin around your vagina (labia).  With the washcloth in your other hand, wipe the inner side of your labia on each side. Do this in a front-to-back direction.  If you are female:  Use one hand to pull back any skin that covers the end of your penis (foreskin).  With the washcloth in your other hand, wipe your penis in small circles. Start wiping at the tip of your penis, then move outward from the catheter.  Move the foreskin back in place, if this applies. 4. With your free hand, hold the catheter close to where it enters your body. Keep holding the catheter during cleaning so it does not get pulled out. 5. Use your other hand to clean the catheter with the washcloth.  Only wipe downward on the catheter.  Do not wipe upward toward your body, because that may push bacteria into your urethra and cause infection. 6. Use a clean towel to pat-dry the catheter and the skin around it. Make sure to wipe off all soap. 7. Wash your hands with soap and water.  Shower every day. Do not take baths.  Do not use cream, ointment, or lotion on the area where the catheter enters your body, unless your health care provider tells you to do that.  Do not use powders, sprays, or lotions on your genital area.  Check your skin around the catheter every day for signs of infection. Check for: ? Redness, swelling, or pain. ? Fluid or blood. ? Warmth. ? Pus or a bad smell. How to empty the drainage bag Supplies needed  Rubbing alcohol.  Gauze pad or cotton ball.  Adhesive tape or a leg strap. Emptying the bag Empty your drainage bag (your overnight drainage bag or your leg bag) when it is ?- full, or at least 2-3 times a day. Clean the drainage bag according to the manufacturer's instructions or as told by your health care provider. 1. Wash your hands with soap and water. 2. Detach the drainage bag from your leg. 3. Hold the drainage bag over the  toilet or a clean container. Make sure the drainage bag is lower than your hips and bladder. This stops urine from going back into the tubing and into your bladder. 4. Open the pour spout at the bottom of the bag. 5. Empty the urine into the toilet or container. Do not let the pour spout touch any surface. This precaution is important to prevent bacteria from getting in the bag and causing infection. 6. Apply rubbing alcohol to a gauze pad or cotton ball. 7. Use the gauze pad or cotton ball to clean the pour spout. 8. Close the pour spout. 9. Attach the bag to your leg with adhesive tape or a leg strap. 10. Wash your hands with soap and water. How to change the drainage bag Supplies needed:  Alcohol wipes.  A clean drainage bag.  Adhesive tape or a leg strap. Changing the bag Replace your drainage bag with a clean bag if it leaks, starts to smell bad, or looks dirty. 1. Wash your hands with soap and water. 2. Detach the dirty drainage bag from your leg. 3. Pinch the catheter with your fingers so that urine does not spill out. 4. Disconnect the catheter tube from the drainage tube at the connection valve. Do not let the tubes touch  any surface. 5. Clean the end of the catheter tube with an alcohol wipe. Use a different alcohol wipe to clean the end of the drainage tube. 6. Connect the catheter tube to the drainage tube of the clean bag. 7. Attach the clean bag to your leg with adhesive tape or a leg strap. Avoid attaching the new bag too tightly. 8. Wash your hands with soap and water. General instructions   Never pull on your catheter or try to remove it. Pulling can damage your internal tissues.  Always wash your hands before and after you handle your catheter or drainage bag. Use a mild, fragrance-free soap. If soap and water are not available, use hand sanitizer.  Always make sure there are no twists or bends (kinks) in the catheter tube.  Always make sure there are no leaks in  the catheter or drainage bag.  Drink enough fluid to keep your urine pale yellow.  Do not take baths, swim, or use a hot tub.  If you are female, wipe from front to back after having a bowel movement. Contact a health care provider if:  Your urine is cloudy.  Your urine smells unusually bad.  Your catheter gets clogged.  Your catheter starts to leak.  Your bladder feels full. Get help right away if:  You have redness, swelling, or pain where the catheter enters your body.  You have fluid, blood, pus, or a bad smell coming from the area where the catheter enters your body.  The area where the catheter enters your body feels warm to the touch.  You have a fever.  You have pain in your abdomen, legs, lower back, or bladder.  You see blood in the catheter.  Your urine is pink or red.  You have nausea, vomiting, or chills.  Your urine is not draining into the bag.  Your catheter gets pulled out. Summary  An indwelling urinary catheter is a thin, flexible, germ-free (sterile) tube that is placed into the bladder to help drain urine out of the body.  The catheter is inserted into the part of the body that drains urine from the bladder (urethra).  Take good care of your catheter to keep it working properly and help prevent problems from developing.  Always wash your hands before and after you handle your catheter or drainage bag.  Never pull on your catheter or try to remove it. This information is not intended to replace advice given to you by your health care provider. Make sure you discuss any questions you have with your health care provider. Document Revised: 09/03/2018 Document Reviewed: 12/26/2016 Elsevier Patient Education  [employer].

## 2020-04-17 ENCOUNTER — Other Ambulatory Visit: Payer: Self-pay

## 2020-04-17 ENCOUNTER — Ambulatory Visit (INDEPENDENT_AMBULATORY_CARE_PROVIDER_SITE_OTHER): Payer: BC Managed Care – PPO

## 2020-04-17 ENCOUNTER — Ambulatory Visit: Payer: BC Managed Care – PPO | Admitting: Primary Care

## 2020-04-17 ENCOUNTER — Encounter: Payer: Self-pay | Admitting: Primary Care

## 2020-04-17 VITALS — BP 134/82 | HR 59 | Temp 97.1°F | Ht 65.0 in | Wt 219.0 lb

## 2020-04-17 DIAGNOSIS — Z114 Encounter for screening for human immunodeficiency virus [HIV]: Secondary | ICD-10-CM | POA: Diagnosis not present

## 2020-04-17 DIAGNOSIS — D494 Neoplasm of unspecified behavior of bladder: Secondary | ICD-10-CM

## 2020-04-17 DIAGNOSIS — Z7901 Long term (current) use of anticoagulants: Secondary | ICD-10-CM | POA: Diagnosis not present

## 2020-04-17 DIAGNOSIS — Z01818 Encounter for other preprocedural examination: Secondary | ICD-10-CM

## 2020-04-17 LAB — COMPREHENSIVE METABOLIC PANEL
ALT: 13 U/L (ref 0–35)
AST: 16 U/L (ref 0–37)
Albumin: 3.7 g/dL (ref 3.5–5.2)
Alkaline Phosphatase: 62 U/L (ref 39–117)
BUN: 14 mg/dL (ref 6–23)
CO2: 29 mEq/L (ref 19–32)
Calcium: 9 mg/dL (ref 8.4–10.5)
Chloride: 105 mEq/L (ref 96–112)
Creatinine, Ser: 0.99 mg/dL (ref 0.40–1.20)
GFR: 61.98 mL/min (ref >60.00)
Glucose, Bld: 86 mg/dL (ref 70–99)
Potassium: 4.1 mEq/L (ref 3.5–5.1)
Sodium: 138 mEq/L (ref 135–145)
Total Bilirubin: 0.3 mg/dL (ref 0.2–1.2)
Total Protein: 6.8 g/dL (ref 6.0–8.3)

## 2020-04-17 LAB — CBC
HCT: 37.3 % (ref 36.0–46.0)
Hemoglobin: 11.9 g/dL — ABNORMAL LOW (ref 12.0–15.0)
MCHC: 31.8 g/dL (ref 30.0–36.0)
MCV: 79.5 fl (ref 78.0–100.0)
Platelets: 344 10*3/uL (ref 150.0–400.0)
RBC: 4.7 Mil/uL (ref 3.87–5.11)
RDW: 15.4 % (ref 11.5–15.5)
WBC: 3.7 10*3/uL — ABNORMAL LOW (ref 4.0–10.5)

## 2020-04-17 LAB — POCT INR: INR: 3 (ref 2.0–3.0)

## 2020-04-17 NOTE — Progress Notes (Signed)
Subjective:    Patient ID: Theresa Lewis, female    DOB: 12/23/1960, 59 y.o.   MRN: [id number]  HPI  This visit occurred during the SARS-CoV-2 public health emergency.  Safety protocols were in place, including screening questions prior to the visit, additional usage of staff PPE, and extensive cleaning of exam room while observing appropriate contact time as indicated for disinfecting solutions.   Theresa Lewis is a 59 year old female with a history of antiphospholipid antibody syndrome managed on warfarin, recent recurrent UTI, newly discovered bladder mass who presents today for surgical clearance.   She is currently pending transurethral resection of bladder tumor (TURBT) for 04/24/20 for bladder mass that was discovered on 04/10/20. Prior to her CT scan she had numerous visits for gross hematuria, UTI with positive urine culture but without resolve despite treatment. She was sent to Urology urgently for evaluation.   She denies acute or new symptoms. She continues to notice intermittent gross hematuria. She has chronic constipation that has not changed.She will be meeting with our Coumadin nurse today to discuss Lovenox bridge as she will come off of warfarin prior to surgery.  Review of Systems  Constitutional: Negative for fever.  Eyes: Negative for visual disturbance.  Respiratory: Negative for shortness of breath.   Cardiovascular: Negative for chest pain.  Gastrointestinal: Positive for constipation.  Genitourinary: Positive for hematuria. Negative for vaginal bleeding.  Neurological: Negative for dizziness.       Past Medical History:  Diagnosis Date  . ABNORMAL VAGINAL BLEEDING 04/19/2007   Qualifier: Diagnosis of  By: Hulan Saas, CMA (AAMA), [place]   . Complication of anesthesia    stays asleep for a "very long time"  . Deep venous thrombosis ([place])    2006    3 x clotting   . GERD (gastroesophageal reflux disease)   . HEMORRHOIDS, WITH BLEEDING 12/26/2008    Qualifier: Diagnosis of  By: Regis Bill MD, Standley Brooking   . PE (pulmonary embolism) 07/09/2010  . Pre-diabetes   . Pulmonary embolism [facility])      Social History   Socioeconomic History  . Marital status: Married    Spouse name: Not on file  . Number of children: 1  . Years of education: Not on file  . Highest education level: Not on file  Occupational History  . Occupation: student advocate   Tobacco Use  . Smoking status: Never Smoker  . Smokeless tobacco: Never Used  Vaping Use  . Vaping Use: Never used  Substance and Sexual Activity  . Alcohol use: Yes    Alcohol/week: 0.0 standard drinks    Comment: rarely  . Drug use: No  . Sexual activity: Not on file  Other Topics Concern  . Not on file  Social History Narrative   HH of 3  Daughter 33 and self.  hh of 2    No pets.   No ets.   No etoh.    Working for [employer] and [employer] .  45- 50 hours per week.                Social Determinants of Health   Financial Resource Strain:   . Difficulty of Paying Living Expenses: Not on file  Food Insecurity:   . Worried About Charity fundraiser in the Last Year: Not on file  . Ran Out of Food in the Last Year: Not on file  Transportation Needs:   . Lack of Transportation (Medical): Not on file  .  Lack of Transportation (Non-Medical): Not on file  Physical Activity:   . Days of Exercise per Week: Not on file  . Minutes of Exercise per Session: Not on file  Stress:   . Feeling of Stress : Not on file  Social Connections:   . Frequency of Communication with Friends and Family: Not on file  . Frequency of Social Gatherings with Friends and Family: Not on file  . Attends Religious Services: Not on file  . Active Member of Clubs or Organizations: Not on file  . Attends Archivist Meetings: Not on file  . Marital Status: Not on file  Intimate Partner Violence:   . Fear of Current or Ex-Partner: Not on file  . Emotionally Abused: Not on file  . Physically Abused: Not on  file  . Sexually Abused: Not on file    Past Surgical History:  Procedure Laterality Date  . MYOMECTOMY    . rectal tear    . TUBAL LIGATION    . vena caval filter      Family History  Problem Relation Age of Onset  . Lung cancer Father 73       april 12   . Pancreatic cancer Father 31  . Hypertension Mother   . Rheum arthritis Mother   . Diabetes Paternal Aunt        x2  . Colon cancer Neg Hx   . Colon polyps Neg Hx   . Kidney disease Neg Hx   . Esophageal cancer Neg Hx   . Gallbladder disease Neg Hx   . Rectal cancer Neg Hx   . Stomach cancer Neg Hx     Allergies  Allergen Reactions  . Clindamycin/Lincomycin     "unknown"  . Tape Other (See Comments)    When pt has a band-aid on, the impression of band-aid stays on for a month    Current Outpatient Medications on File Prior to Visit  Medication Sig Dispense Refill  . gabapentin (NEURONTIN) 100 MG capsule Take 1 capsule by mouth three times weekly as needed. (Patient taking differently: Take 100 mg by mouth every Tuesday, Thursday, and Saturday at 6 PM. .) 90 capsule 0  . omeprazole (PRILOSEC) 20 MG capsule Take 1 capsule by mouth three times weekly. (Patient taking differently: Take 20 mg by mouth every Monday, Wednesday, and Friday. ) 90 capsule 0  . polyethylene glycol (MIRALAX / GLYCOLAX) 17 g packet Take 17 g by mouth 3 (three) times a week.    . warfarin (COUMADIN) 5 MG tablet TAKE 1 1/2 TABLETS BY MOUTH DAILY EXCEPT TAKE 2 TABLETS ON WED OR AS DIRECTED BY COUMADIN CLINIC. (Patient taking differently: Take 7.5-10 mg by mouth See admin instructions. Take 7.5 mg by mouth daily except on Wednesday take 10 mg) 120 tablet 1   No current facility-administered medications on file prior to visit.    BP 134/82   Pulse (!) 59   Temp (!) 97.1 F (36.2 C) (Temporal)   Ht 5\' 5"  (1.651 m)   Wt 219 lb (99.3 kg)   SpO2 97%   BMI 36.44 kg/m    Objective:   Physical Exam Cardiovascular:     Rate and Rhythm: Normal  rate and regular rhythm.  Pulmonary:     Effort: Pulmonary effort is normal.     Breath sounds: Normal breath sounds.  Abdominal:     General: Abdomen is flat.     Palpations: Abdomen is soft.  Musculoskeletal:  Cervical back: Neck supple.  Skin:    General: Skin is warm and dry.  Neurological:     Mental Status: She is alert.            Assessment & Plan:

## 2020-04-17 NOTE — Assessment & Plan Note (Signed)
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   Pre operative exam today benign.  CBC and CMP pending. ECG today with sinus bradycardia with rate of 54. No PAC/PVC, t-wave changes. Appears very similar to ECG from 2013 which also shows sinus bradycardia.   Plan is in place for warfarin/Lovenox bridge. Cleared for surgery pending labs.

## 2020-04-17 NOTE — Patient Instructions (Addendum)
Stop by the lab prior to leaving today. I will notify you of your results once received.   Please stop by and speak with [facility] today.  It was a pleasure to see you today!    Health Maintenance Due  Topic Date Due  . HIV Screening  Added to labs today  Never done      Recommended follow up: No follow-ups on file.

## 2020-04-17 NOTE — Patient Instructions (Addendum)
Pre visit review using our clinic review tool, if applicable. No additional management support is needed unless otherwise documented below in the visit note.  11/24(Wed): Last dose of coumadin 11/25(Thurs)-11/30(Tues): HOLD COUMADIN 11/30(Tues): PROCEDURE- NO Coumadin  HOLD ON COUMADIN AFTER PROCEDURE UNTIL NOTIFIED BY DR. STOIOFF'S OFFICE.

## 2020-04-18 ENCOUNTER — Other Ambulatory Visit: Payer: Self-pay | Admitting: Urology

## 2020-04-18 LAB — HIV ANTIBODY (ROUTINE TESTING W REFLEX): HIV 1&2 Ab, 4th Generation: NONREACTIVE

## 2020-04-19 LAB — CULTURE, URINE COMPREHENSIVE

## 2020-04-20 ENCOUNTER — Other Ambulatory Visit
Admission: RE | Admit: 2020-04-20 | Discharge: 2020-04-20 | Disposition: A | Discharge status: Home / Self Care | Payer: BC Managed Care – PPO | Source: Ambulatory Visit | Attending: Urology | Admitting: Urology

## 2020-04-20 ENCOUNTER — Other Ambulatory Visit: Payer: Self-pay

## 2020-04-20 DIAGNOSIS — Z20822 Contact with and (suspected) exposure to covid-19: Secondary | ICD-10-CM | POA: Diagnosis not present

## 2020-04-20 DIAGNOSIS — Z01812 Encounter for preprocedural laboratory examination: Secondary | ICD-10-CM | POA: Insufficient documentation

## 2020-04-21 LAB — SARS CORONAVIRUS 2 (TAT 6-24 HRS): SARS Coronavirus 2: NEGATIVE

## 2020-04-23 MED ORDER — CHLORHEXIDINE GLUCONATE 0.12 % MT SOLN
15.0000 mL | Freq: Once | OROMUCOSAL | Status: AC
  Administered 2020-04-24: 15 mL via OROMUCOSAL

## 2020-04-23 MED ORDER — CEFAZOLIN SODIUM-DEXTROSE 2-4 GM/100ML-% IV SOLN
2.0000 g | INTRAVENOUS | Status: AC
  Administered 2020-04-24: 2 g via INTRAVENOUS

## 2020-04-23 MED ORDER — LACTATED RINGERS IV SOLN: INTRAVENOUS | Status: DC

## 2020-04-23 MED ORDER — ORAL CARE MOUTH RINSE: 15.0000 mL | Freq: Once | OROMUCOSAL | Status: AC

## 2020-04-24 ENCOUNTER — Ambulatory Visit
Admission: RE | Admit: 2020-04-24 | Discharge: 2020-04-24 | Disposition: A | Discharge status: Home / Self Care | Payer: BC Managed Care – PPO | Attending: Urology | Admitting: Urology

## 2020-04-24 ENCOUNTER — Other Ambulatory Visit: Payer: Self-pay

## 2020-04-24 ENCOUNTER — Ambulatory Visit: Payer: BC Managed Care – PPO | Admitting: Urgent Care

## 2020-04-24 ENCOUNTER — Telehealth: Payer: Self-pay | Admitting: Urology

## 2020-04-24 ENCOUNTER — Encounter: Payer: Self-pay | Admitting: Urology

## 2020-04-24 ENCOUNTER — Encounter
Admission: RE | Disposition: A | Discharge status: Home / Self Care | Payer: Self-pay | Source: Home / Self Care | Attending: Urology

## 2020-04-24 DIAGNOSIS — C672 Malignant neoplasm of lateral wall of bladder: Secondary | ICD-10-CM | POA: Insufficient documentation

## 2020-04-24 DIAGNOSIS — Z79899 Other long term (current) drug therapy: Secondary | ICD-10-CM | POA: Insufficient documentation

## 2020-04-24 DIAGNOSIS — Z888 Allergy status to other drugs, medicaments and biological substances status: Secondary | ICD-10-CM | POA: Diagnosis not present

## 2020-04-24 DIAGNOSIS — Z881 Allergy status to other antibiotic agents status: Secondary | ICD-10-CM | POA: Insufficient documentation

## 2020-04-24 DIAGNOSIS — N3289 Other specified disorders of bladder: Secondary | ICD-10-CM

## 2020-04-24 DIAGNOSIS — Z7901 Long term (current) use of anticoagulants: Secondary | ICD-10-CM | POA: Insufficient documentation

## 2020-04-24 DIAGNOSIS — D494 Neoplasm of unspecified behavior of bladder: Secondary | ICD-10-CM | POA: Diagnosis not present

## 2020-04-24 HISTORY — PX: TRANSURETHRAL RESECTION OF BLADDER TUMOR: SHX2575

## 2020-04-24 LAB — PROTIME-INR
INR: 1.1 (ref 0.8–1.2)
Prothrombin Time: 13.4 seconds (ref 11.4–15.2)

## 2020-04-24 SURGERY — TURBT (TRANSURETHRAL RESECTION OF BLADDER TUMOR)
Anesthesia: General

## 2020-04-24 MED ORDER — OXYBUTYNIN CHLORIDE 5 MG PO TABS: ORAL_TABLET | ORAL | 0 refills | Status: DC

## 2020-04-24 MED ORDER — FENTANYL CITRATE (PF) 100 MCG/2ML IJ SOLN
INTRAMUSCULAR | Status: AC
  Filled 2020-04-24: qty 2

## 2020-04-24 MED ORDER — CHLORHEXIDINE GLUCONATE 0.12 % MT SOLN
OROMUCOSAL | Status: AC
  Filled 2020-04-24: qty 15

## 2020-04-24 MED ORDER — DEXAMETHASONE SODIUM PHOSPHATE 10 MG/ML IJ SOLN
INTRAMUSCULAR | Status: DC | PRN
  Administered 2020-04-24: 10 mg via INTRAVENOUS

## 2020-04-24 MED ORDER — FENTANYL CITRATE (PF) 100 MCG/2ML IJ SOLN
INTRAMUSCULAR | Status: DC | PRN
  Administered 2020-04-24 (×2): 50 ug via INTRAVENOUS

## 2020-04-24 MED ORDER — LORAZEPAM 2 MG/ML IJ SOLN: 1.0000 mg | Freq: Once | INTRAMUSCULAR | Status: DC | PRN

## 2020-04-24 MED ORDER — MIDAZOLAM HCL 2 MG/2ML IJ SOLN
INTRAMUSCULAR | Status: DC | PRN
  Administered 2020-04-24: 2 mg via INTRAVENOUS

## 2020-04-24 MED ORDER — PROPOFOL 10 MG/ML IV BOLUS
INTRAVENOUS | Status: DC | PRN
  Administered 2020-04-24: 130 mg via INTRAVENOUS

## 2020-04-24 MED ORDER — SUCCINYLCHOLINE CHLORIDE 200 MG/10ML IV SOSY
PREFILLED_SYRINGE | INTRAVENOUS | Status: AC
  Filled 2020-04-24: qty 10

## 2020-04-24 MED ORDER — HYDROMORPHONE HCL 1 MG/ML IJ SOLN
INTRAMUSCULAR | Status: AC
  Filled 2020-04-24: qty 1

## 2020-04-24 MED ORDER — SUCCINYLCHOLINE CHLORIDE 20 MG/ML IJ SOLN
INTRAMUSCULAR | Status: DC | PRN
  Administered 2020-04-24: 120 mg via INTRAVENOUS

## 2020-04-24 MED ORDER — CEFAZOLIN SODIUM-DEXTROSE 2-4 GM/100ML-% IV SOLN
INTRAVENOUS | Status: AC
  Filled 2020-04-24: qty 100

## 2020-04-24 MED ORDER — OXYCODONE HCL 5 MG/5ML PO SOLN: 5.0000 mg | Freq: Once | ORAL | Status: DC | PRN

## 2020-04-24 MED ORDER — HYDROCODONE-ACETAMINOPHEN 5-325 MG PO TABS
1.0000 | ORAL_TABLET | Freq: Four times a day (QID) | ORAL | 0 refills | Status: DC | PRN
Start: 2020-04-24 — End: 2020-10-31

## 2020-04-24 MED ORDER — MEPERIDINE HCL 50 MG/ML IJ SOLN: 6.2500 mg | INTRAMUSCULAR | Status: DC | PRN

## 2020-04-24 MED ORDER — OXYCODONE HCL 5 MG PO TABS: 5.0000 mg | ORAL_TABLET | Freq: Once | ORAL | Status: DC | PRN

## 2020-04-24 MED ORDER — KETOROLAC TROMETHAMINE 30 MG/ML IJ SOLN
INTRAMUSCULAR | Status: DC | PRN
  Administered 2020-04-24: 30 mg via INTRAVENOUS

## 2020-04-24 MED ORDER — MIDAZOLAM HCL 2 MG/2ML IJ SOLN
INTRAMUSCULAR | Status: AC
  Filled 2020-04-24: qty 2

## 2020-04-24 MED ORDER — DEXAMETHASONE SODIUM PHOSPHATE 10 MG/ML IJ SOLN
INTRAMUSCULAR | Status: AC
  Filled 2020-04-24: qty 1

## 2020-04-24 MED ORDER — SUGAMMADEX SODIUM 200 MG/2ML IV SOLN
INTRAVENOUS | Status: DC | PRN
  Administered 2020-04-24: 200 mg via INTRAVENOUS

## 2020-04-24 MED ORDER — DROPERIDOL 2.5 MG/ML IJ SOLN
0.6250 mg | Freq: Once | INTRAMUSCULAR | Status: DC | PRN
  Filled 2020-04-24: qty 2

## 2020-04-24 MED ORDER — KETOROLAC TROMETHAMINE 30 MG/ML IJ SOLN
INTRAMUSCULAR | Status: AC
  Filled 2020-04-24: qty 1

## 2020-04-24 MED ORDER — LIDOCAINE HCL (CARDIAC) PF 100 MG/5ML IV SOSY
PREFILLED_SYRINGE | INTRAVENOUS | Status: DC | PRN
  Administered 2020-04-24: 80 mg via INTRAVENOUS

## 2020-04-24 MED ORDER — ROCURONIUM BROMIDE 100 MG/10ML IV SOLN
INTRAVENOUS | Status: DC | PRN
  Administered 2020-04-24: 10 mg via INTRAVENOUS
  Administered 2020-04-24: 5 mg via INTRAVENOUS
  Administered 2020-04-24: 45 mg via INTRAVENOUS

## 2020-04-24 MED ORDER — ONDANSETRON HCL 4 MG/2ML IJ SOLN
INTRAMUSCULAR | Status: AC
  Filled 2020-04-24: qty 2

## 2020-04-24 MED ORDER — PROMETHAZINE HCL 25 MG/ML IJ SOLN: 6.2500 mg | INTRAMUSCULAR | Status: DC | PRN

## 2020-04-24 MED ORDER — PROPOFOL 10 MG/ML IV BOLUS
INTRAVENOUS | Status: AC
  Filled 2020-04-24: qty 20

## 2020-04-24 MED ORDER — ROCURONIUM BROMIDE 10 MG/ML (PF) SYRINGE
PREFILLED_SYRINGE | INTRAVENOUS | Status: AC
  Filled 2020-04-24: qty 20

## 2020-04-24 MED ORDER — HYDROMORPHONE HCL 1 MG/ML IJ SOLN
INTRAMUSCULAR | Status: DC | PRN
Start: 2020-04-24 — End: 2020-04-24
  Administered 2020-04-24: .5 mg via INTRAVENOUS

## 2020-04-24 MED ORDER — HYDROMORPHONE HCL 1 MG/ML IJ SOLN: 0.2500 mg | INTRAMUSCULAR | Status: DC | PRN

## 2020-04-24 MED ORDER — ONDANSETRON HCL 4 MG/2ML IJ SOLN
INTRAMUSCULAR | Status: DC | PRN
  Administered 2020-04-24: 4 mg via INTRAVENOUS

## 2020-04-24 SURGICAL SUPPLY — 27 items
BAG DRAIN CYSTO-URO LG1000N (MISCELLANEOUS) ×3 IMPLANT
BAG DRN RND TRDRP ANRFLXCHMBR (UROLOGICAL SUPPLIES) ×1
BAG URINE DRAIN 2000ML AR STRL (UROLOGICAL SUPPLIES) ×3 IMPLANT
CATH FOLEY 2WAY 18X30 (CATHETERS) IMPLANT
CATH FOLEY 2WAY SIL 18X30 (CATHETERS)
CATH FOLEY 2WAY SIL 20X30 (CATHETERS) ×2 IMPLANT
DRAPE UTILITY 15X26 TOWEL STRL (DRAPES) ×3 IMPLANT
DRSG TELFA 4X3 1S NADH ST (GAUZE/BANDAGES/DRESSINGS) ×3 IMPLANT
ELECT LOOP 22F BIPOLAR SML (ELECTROSURGICAL)
ELECT REM PT RETURN 9FT ADLT (ELECTROSURGICAL)
ELECTRODE LOOP 22F BIPOLAR SML (ELECTROSURGICAL) IMPLANT
ELECTRODE REM PT RTRN 9FT ADLT (ELECTROSURGICAL) IMPLANT
GLOVE BIOGEL PI IND STRL 7.5 (GLOVE) ×1 IMPLANT
GLOVE BIOGEL PI INDICATOR 7.5 (GLOVE) ×2
GOWN STRL REUS W/ TWL LRG LVL3 (GOWN DISPOSABLE) ×1 IMPLANT
GOWN STRL REUS W/TWL LRG LVL3 (GOWN DISPOSABLE) ×3
GOWN STRL REUS W/TWL XL LVL4 (GOWN DISPOSABLE) ×3 IMPLANT
IV NS IRRIG 3000ML ARTHROMATIC (IV SOLUTION) ×22 IMPLANT
KIT TURNOVER CYSTO (KITS) ×3 IMPLANT
LOOP CUT BIPOLAR 24F LRG (ELECTROSURGICAL) ×2 IMPLANT
MANIFOLD NEPTUNE II (INSTRUMENTS) ×3 IMPLANT
PACK CYSTO AR (MISCELLANEOUS) ×3 IMPLANT
SET IRRIG Y TYPE TUR BLADDER L (SET/KITS/TRAYS/PACK) ×3 IMPLANT
SURGILUBE 2OZ TUBE FLIPTOP (MISCELLANEOUS) ×3 IMPLANT
SYR TOOMEY IRRIG 70ML (MISCELLANEOUS) ×3
SYRINGE TOOMEY IRRIG 70ML (MISCELLANEOUS) ×1 IMPLANT
WATER STERILE IRR 1000ML POUR (IV SOLUTION) ×3 IMPLANT

## 2020-04-24 NOTE — Discharge Instructions (Addendum)
AMBULATORY SURGERY  DISCHARGE INSTRUCTIONS   1) The drugs that you were given will stay in your system until tomorrow so for the next 24 hours you should not:  A) Drive an automobile B) Make any legal decisions C) Drink any alcoholic beverage   2) You may resume regular meals tomorrow.  Today it is better to start with liquids and gradually work up to solid foods.  You may eat anything you prefer, but it is better to start with liquids, then soup and crackers, and gradually work up to solid foods.   3) Please notify your doctor immediately if you have any unusual bleeding, trouble breathing, redness and pain at the surgery site, drainage, fever, or pain not relieved by medication.    4) Additional Instructions:        Please contact your physician with any problems or Same Day Surgery at [phone], Monday through Friday 6 am to 4 pm, or [place] at [facility] number at [phone].Transurethral Resection of Bladder Tumor (TURBT) or Bladder Biopsy   Definition:  Transurethral Resection of the Bladder Tumor is a surgical procedure used to diagnose and remove tumors within the bladder.   General instructions:     Your recent bladder surgery requires very little post hospital care but some definite precautions.  Despite the fact that no skin incisions were used, the area around the bladder incisions are raw and covered with scabs to promote healing and prevent bleeding. Certain precautions are needed to insure that the scabs are not disturbed over the next 2-4 weeks while the healing proceeds.  Because the raw surface inside your bladder and the irritating effects of urine you may expect frequency of urination and/or urgency (a stronger desire to urinate) and perhaps even getting up at night more often. This will usually resolve or improve slowly over the healing period. You may see some blood in your urine over the first 6 weeks. Do not be alarmed, even if the urine was  clear for a while. Get off your feet and drink lots of fluids until clearing occurs. If you start to pass clots or don't improve call us.  Diet:  You may return to your normal diet immediately. Because of the raw surface of your bladder, alcohol, spicy foods, foods high in acid and drinks with caffeine may cause irritation or frequency and should be used in moderation. To keep your urine flowing freely and avoid constipation, drink plenty of fluids during the day (8-10 glasses). Tip: Avoid cranberry juice because it is very acidic.  Activity:  Your physical activity doesn't need to be restricted. However, if you are very active, you may see some blood in the urine. We suggest that you reduce your activity under the circumstances until the bleeding has stopped.  Bowels:  It is important to keep your bowels regular during the postoperative period. Straining with bowel movements can cause bleeding. A bowel movement every other day is reasonable. Use a mild laxative if needed, such as milk of magnesia 2-3 tablespoons, or 2 Dulcolax tablets. Call if you continue to have problems. If you had been taking narcotics for pain, before, during or after your surgery, you may be constipated. Take a laxative if necessary.    Medication:  You should resume your pre-surgery medications unless told not to. In addition you may be given an antibiotic to prevent or treat infection. Antibiotics are not always necessary. All medication should be taken as prescribed until the bottles are finished unless  you are having an unusual reaction to one of the drugs.\  You may restart Coumadin 04/25/2020 if urine clear to pink-tinged  Prescriptions for pain medication and bladder spasm were sent to your pharmacy   [facility] [address] [place], [place] [address] [phone]

## 2020-04-24 NOTE — Transfer of Care (Signed)
Immediate Anesthesia Transfer of Care Note  Patient: Theresa Lewis  Procedure(s) Performed: TRANSURETHRAL RESECTION OF BLADDER TUMOR (TURBT) (N/A )  Patient Location: PACU  Anesthesia Type:General  Level of Consciousness: awake and alert   Airway & Oxygen Therapy: Patient Spontanous Breathing and Patient connected to face mask oxygen  Post-op Assessment: Report given to RN and Post -op Vital signs reviewed and stable  Post vital signs: Reviewed and stable  Last Vitals:  Vitals Value Taken Time  BP 136/68 04/24/20 1230  Temp 36.2 C 04/24/20 1230  Pulse 52 04/24/20 1233  Resp 13 04/24/20 1233  SpO2 100 % 04/24/20 1233  Vitals shown include unvalidated device data.  Last Pain:  Vitals:   04/24/20 0809  TempSrc: Oral  PainSc: 0-No pain         Complications: No complications documented.

## 2020-04-24 NOTE — Progress Notes (Addendum)
   04/24/20 0758  Clinical Encounter Type  Visited With Patient and family together  Visit Type Initial  Referral From Chaplain  Consult/Referral To Chaplain  While rounding [facility] waiting area, chaplain visited with Pt and her husband. Chaplain asked Pt how she was doing and she said she was nervous.. Pt explained there is a mass on her bladder. Chaplain asked if she could pray with them and she said yes. Chaplain knelt down to pray as Pt and her husband held chaplain's hand. After praying chaplain wished them well.

## 2020-04-24 NOTE — Telephone Encounter (Signed)
-----   Message from Abbie Sons, MD sent at 04/24/2020 12:43 PM EST ----- Please schedule PA appointment Friday 12/3 for catheter removal (does not need voiding trial)

## 2020-04-24 NOTE — Telephone Encounter (Signed)
App made pt is aware

## 2020-04-24 NOTE — Interval H&P Note (Signed)
History and Physical Interval Note:  The procedure was discussed in detail. All questions were answered and she desires to proceed. We discussed possible need of a right ureteral stent if there is involvement of the ureteral orifice. The potential need for follow-up and physical therapy was also discussed.  CV: RRR Lungs: Clear  04/24/2020 9:42 AM  Theresa Lewis  has presented today for surgery, with the diagnosis of bladder mass.  The various methods of treatment have been discussed with the patient and family. After consideration of risks, benefits and other options for treatment, the patient has consented to  Procedure(s): TRANSURETHRAL RESECTION OF BLADDER TUMOR (TURBT) (N/A) as a surgical intervention.  The patient's history has been reviewed, patient examined, no change in status, stable for surgery.  I have reviewed the patient's chart and labs.  Questions were answered to the patient's satisfaction.     [place]

## 2020-04-24 NOTE — Op Note (Signed)
Preoperative diagnosis: Bladder tumor (>6 cm)  Postoperative diagnosis:  Bladder tumor (>6 cm)  Procedure:  Cystoscopy Transurethral resection of bladder tumor (>5 cm)  Surgeon: Nicki Reaper C. Aralyn Nowak, M.D.  Anesthesia: General  Complications: None  Intraoperative findings:  1. Bladder tumor: Large papillary bladder tumor encompassing the right lateral wall and measuring >6 cm in its largest dimension.  Trigone and right ureter not involved.  Early papillary mucosal changes mid posterior wall   EBL: Minimal  Specimens: Bladder tumor Base of tumor      Indication: Theresa Lewis is a 59 y.o female with a history of gross hematuria and found on CT urogram to have a large tumor of the right bladder wall. After reviewing the management options for treatment, he elected to proceed with the above surgical procedure(s). We have discussed the potential benefits and risks of the procedure, side effects of the proposed treatment, the likelihood of the patient achieving the goals of the procedure, and any potential problems that might occur during the procedure or recuperation. Informed consent has been obtained.  Description of procedure:  The patient was taken to the operating room and general anesthesia was induced with an endotracheal tube and relaxation.  The patient was placed in the dorsal lithotomy position, prepped and draped in the usual sterile fashion, and preoperative antibiotics were administered. A preoperative time-out was performed.   A 21 French cystoscope with obturator was lubricated however was unable to be advanced in the bladder secondary to a small urethral meatus.  The meatus was dilated with female sounds to 87 [place].  The cystoscope was then repassed and cystoscopy was performed with findings as described above.  A 24 French continuous-flow resectoscope sheath with obturator was lubricated and passed per urethra.  An Iglesias resectoscope with loop was then placed  into the sheath.  The ureteral orifices were identified and not involved with tumor.  The upper limit of the tumor was identified and resection was commenced at the superior aspect of the tumor and working inferiorly and from posterior to anterior.  Hemostasis was obtained with cautery.  The 24 French resectoscope was exchanged with a 26 French resectoscope for better flow and visualization.  Tumor was periodically removed from the bladder with [facility] syringe irrigation.  The bulk of the tumor was completely resected.  A second resection of the deeper portions of the tumor bed where muscle was not identified were resected and sent separately.  The areas of early papillary change were cauterized.  Hemostasis was obtained with cautery.  At procedure completion the ureteral orifices were identified and were normal-appearing with clear efflux.  Due to tumor size resection time was 1 hour 45 minutes.  A 20 French Foley catheter was placed with return of pink-tinged effluent upon irrigation.  The patient appeared to tolerate the procedure well and without complications.  The patient was able to be awakened and transferred to the recovery unit in satisfactory condition.    Plan: She will be notified with the pathology results Follow-up 04/27/2020 for catheter removal   Marvens Hollars C. Bernardo Heater,  MD

## 2020-04-24 NOTE — Progress Notes (Signed)
   04/24/20 0758  Clinical Encounter Type  Visited With Patient and family together  Visit Type Initial  Referral From Chaplain  Consult/Referral To Chaplain  While rounding [facility] waiting area, chaplain visited with Pt and her husband. Chaplain asked Pt how she was doing and she said she was nervous.. Pt explained there is a mass on her bladder. Chaplain asked if she could pray with them and she said yes. Chaplain said

## 2020-04-24 NOTE — Progress Notes (Signed)
   04/24/20 0758  Clinical Encounter Type  Visited With Patient and family together  Visit Type Initial  Referral From Chaplain  Consult/Referral To Chaplain  While rounding [facility] waiting area, chaplain visited with Pt and her husband. Chaplain asked Pt how she was doing and she said she was nervous.. Pt explained there is a mass on her bladder. Chaplain asked if she could pray with them and she said yes. Chaplain knelt down to pray and Pt and her husband held chaplain's hand. After she prayed, Chaplain wished them well and left.

## 2020-04-24 NOTE — Anesthesia Procedure Notes (Signed)
Procedure Name: Intubation Date/Time: 04/24/2020 10:14 AM Performed by: Esaw Grandchild, CRNA Pre-anesthesia Checklist: Patient identified, Emergency Drugs available, Suction available and Patient being monitored Patient Re-evaluated:Patient Re-evaluated prior to induction Oxygen Delivery Method: Circle system utilized Preoxygenation: Pre-oxygenation with 100% oxygen Induction Type: IV induction Ventilation: Mask ventilation without difficulty Laryngoscope Size: Miller and 2 Grade View: Grade II Tube type: Oral Tube size: 7.5 mm Number of attempts: 1 Airway Equipment and Method: Stylet and Oral airway Placement Confirmation: ETT inserted through vocal cords under direct vision,  positive ETCO2 and breath sounds checked- equal and bilateral Secured at: 21 cm Tube secured with: Tape Dental Injury: Teeth and Oropharynx as per pre-operative assessment

## 2020-04-24 NOTE — Anesthesia Preprocedure Evaluation (Signed)
Anesthesia Evaluation  Patient identified by MRN, date of birth, ID band Patient awake    Reviewed: Allergy & Precautions, NPO status , Patient's Chart, lab work & pertinent test results  Airway Mallampati: II       Dental no notable dental hx.    Pulmonary neg pulmonary ROS,    Pulmonary exam normal breath sounds clear to auscultation       Cardiovascular negative cardio ROS Normal cardiovascular exam Rhythm:Regular Rate:Normal     Neuro/Psych  Neuromuscular disease negative psych ROS   GI/Hepatic Neg liver ROS, GERD  ,  Endo/Other  negative endocrine ROS  Renal/GU negative Renal ROS  negative genitourinary   Musculoskeletal negative musculoskeletal ROS (+)   Abdominal   Peds negative pediatric ROS (+)  Hematology negative hematology ROS (+)   Anesthesia Other Findings Past Medical History: 04/19/2007: ABNORMAL VAGINAL BLEEDING     Comment:  Qualifier: Diagnosis of  By: Hulan Saas, CMA (AAMA),               Larene Beach S  No date: Complication of anesthesia     Comment:  stays asleep for a "very long time" No date: Deep venous thrombosis ([place])     Comment:  2006    3 x clotting  No date: GERD (gastroesophageal reflux disease) 12/26/2008: HEMORRHOIDS, WITH BLEEDING     Comment:  Qualifier: Diagnosis of  By: Regis Bill MD, Standley Brooking  07/09/2010: PE (pulmonary embolism) No date: Pre-diabetes No date: Pulmonary embolism ([facility])   Reproductive/Obstetrics negative OB ROS                             Anesthesia Physical Anesthesia Plan  ASA: II  Anesthesia Plan: General   Post-op Pain Management:    Induction: Intravenous  PONV Risk Score and Plan: 3 and Ondansetron and Dexamethasone  Airway Management Planned: Oral ETT  Additional Equipment:   Intra-op Plan:   Post-operative Plan:   Informed Consent: I have reviewed the patients History and Physical, chart, labs and discussed the  procedure including the risks, benefits and alternatives for the proposed anesthesia with the patient or authorized representative who has indicated his/her understanding and acceptance.       Plan Discussed with: CRNA, Anesthesiologist and Surgeon  Anesthesia Plan Comments:         Anesthesia Quick Evaluation

## 2020-04-24 NOTE — Anesthesia Postprocedure Evaluation (Signed)
Anesthesia Post Note  Patient: Theresa Lewis  Procedure(s) Performed: TRANSURETHRAL RESECTION OF BLADDER TUMOR (TURBT) (N/A )  Patient location during evaluation: PACU Anesthesia Type: General Level of consciousness: awake and awake and alert Pain management: pain level controlled Vital Signs Assessment: post-procedure vital signs reviewed and stable Respiratory status: spontaneous breathing Cardiovascular status: blood pressure returned to baseline and stable Postop Assessment: no apparent nausea or vomiting Anesthetic complications: no   No complications documented.   Last Vitals:  Vitals:   04/24/20 0809 04/24/20 1230  BP: (!) 154/84 136/68  Pulse: 62 (!) 56  Resp: 16 17  Temp: (!) 36.3 C (!) 36.2 C  SpO2: 99% 100%    Last Pain:  Vitals:   04/24/20 0809  TempSrc: Oral  PainSc: 0-No pain                 Neva Seat

## 2020-04-25 ENCOUNTER — Encounter: Payer: Self-pay | Admitting: Urology

## 2020-04-25 LAB — SURGICAL PATHOLOGY

## 2020-04-26 ENCOUNTER — Telehealth: Payer: Self-pay

## 2020-04-26 ENCOUNTER — Telehealth: Payer: Self-pay | Admitting: Urology

## 2020-04-26 NOTE — Telephone Encounter (Signed)
Patient called stating that she is a few day post TURBT with a catheter in place. She has tolerated well until this morning when the catheter starting causing discomfort to her urethra and she noted discharge in her underwear. Patient wanted to know if this was normal and if anything needed to be done. She was notified that this can happen with a catheter in place and to make sure that her bag location is not causing any tugging on her tubing. She will have her catheter removed tomorrow and her symptoms should get better after the removal. Patient verbalized understanding

## 2020-04-26 NOTE — Telephone Encounter (Signed)
Theresa Lewis was contacted regarding her pathology report.  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  Muscle was present and no evidence of muscular invasion.  Based on her large tumor I would recommend restaging in 2 to 3 weeks as we discussed approximately 30% of T1 tumors will be upstaged to muscle invasive disease.  If there is no evidence of muscle invasion would recommend a 6-week course of intravesical therapy.  She is scheduled for catheter removal tomorrow.

## 2020-04-27 ENCOUNTER — Ambulatory Visit (INDEPENDENT_AMBULATORY_CARE_PROVIDER_SITE_OTHER): Payer: BC Managed Care – PPO | Admitting: Urology

## 2020-04-27 ENCOUNTER — Telehealth: Payer: Self-pay | Admitting: Primary Care

## 2020-04-27 ENCOUNTER — Other Ambulatory Visit: Payer: Self-pay

## 2020-04-27 DIAGNOSIS — C679 Malignant neoplasm of bladder, unspecified: Secondary | ICD-10-CM | POA: Diagnosis not present

## 2020-04-27 NOTE — Telephone Encounter (Signed)
Spoke with Dr. Bernardo Heater through secure chat regarding when to resume patient's warfarin. His response is below.  I agree with his recommendation as long as she's not noticed gross hematuria.   "I had put in her discharge instructions she was okay to start back tomorrow if urine was not bloody. I did tell her she will probably note intermittent hematuria 7 to 10 days after the procedure when the "scab" starts to slough and she may need to hold 24-48 hours if she has significant bleeding. She had subepithelial invasion and we will get her scheduled for a follow-up restaging TURBT in about 3 weeks"

## 2020-04-27 NOTE — Telephone Encounter (Signed)
  Contacted pt who reports she has not had any bleeding but was told that it would be normal to have some bleeding with the coumadin but if she had significant bleeding to stop the coumadin. She said she was told to restart coumadin yesterday and has taken 10mg  yesterday and 7.5mg  today, so she has just followed her regular dosing schedule.   New dosing schedule. 12/1: pt took 10mg  12/2: pt took 7.5mg  12/3 advised to take 10mg  12/4: advised to take 10mg  12/5: advised to take 10mg  12/6 advised to take 7.5mg  12/7: advised to hold dose until INR tested in clinic at 2:30 pm.   Pt verbalized understanding.

## 2020-04-27 NOTE — Progress Notes (Signed)
Catheter Removal  Patient is present today for a catheter removal.  40ml of water was drained from the balloon. A 20FR foley cath was removed from the bladder no complications were noted . Patient tolerated well.  Performed by: Verlene Mayer, CMA  Follow up/ Additional notes: Schedule follow up procedure per Dr. Bernardo Heater

## 2020-04-27 NOTE — Telephone Encounter (Signed)
Noted, thank you

## 2020-05-01 ENCOUNTER — Other Ambulatory Visit: Payer: Self-pay

## 2020-05-01 ENCOUNTER — Ambulatory Visit: Payer: BC Managed Care – PPO

## 2020-05-01 DIAGNOSIS — Z7901 Long term (current) use of anticoagulants: Secondary | ICD-10-CM | POA: Diagnosis not present

## 2020-05-01 LAB — POCT INR: INR: 3 (ref 2.0–3.0)

## 2020-05-01 NOTE — Patient Instructions (Addendum)
Pre visit review using our clinic review tool, if applicable. No additional management support is needed unless otherwise documented below in the visit note.  Continue taking 7.5mg  except take 10mg  on Wednesdays.

## 2020-05-02 ENCOUNTER — Other Ambulatory Visit: Payer: Self-pay | Admitting: Radiology

## 2020-05-02 ENCOUNTER — Encounter: Payer: Self-pay | Admitting: Urology

## 2020-05-02 DIAGNOSIS — C679 Malignant neoplasm of bladder, unspecified: Secondary | ICD-10-CM | POA: Insufficient documentation

## 2020-05-02 NOTE — Progress Notes (Signed)
I contacted Ms. Oates yesterday to discuss her pathology report.  She presented today for catheter removal and had additional questions.  The office staff had mentioned to BCG however I have recommended restaging TURBT prior to intravesical therapy.  The rationale for restaging TURBT was discussed in detail.  Based on these findings will determine intravesical therapy versus treatment for muscle invasive disease.  All of her questions were answered and she desires to schedule staging TURBT in 2-3 weeks.

## 2020-05-03 ENCOUNTER — Ambulatory Visit: Payer: BC Managed Care – PPO

## 2020-05-07 ENCOUNTER — Other Ambulatory Visit: Payer: Self-pay

## 2020-05-07 ENCOUNTER — Other Ambulatory Visit: Payer: BC Managed Care – PPO

## 2020-05-07 DIAGNOSIS — C679 Malignant neoplasm of bladder, unspecified: Secondary | ICD-10-CM

## 2020-05-09 ENCOUNTER — Telehealth: Payer: Self-pay

## 2020-05-09 NOTE — Telephone Encounter (Signed)
Noted  

## 2020-05-09 NOTE — Telephone Encounter (Addendum)
Pt called to report she is having TURBT on 05/15/20 and was told by urology to stop coumadin on 05/10/20. Pt will take last dose of coumadin today, 12/15.  Proposed dosing for after procedure is contingent on surgeon instructions and pt should follow surgeon instructions for couamadin first. If any changes to coumadin dosing by surgeon pt should contact this office to let the coumadin nurse know of change.   Proposed dosing for after procedure. 12/16-12/22: no coumadin 12/23: take 7.5mg  coumadin 12/24: take 10mg  coumadin 12/25: take 10mg  coumadin 12/26: take 10mg  coumadin 12/27: take 10mg  coumadin 12/28: hold coumadin until recheck INR in office  Advised pt of proposed dosing. She will come in tomorrow to have INR checked. Dosing will be given to her at that time. Pt placed on schedule for tomorrow at 1pm.

## 2020-05-10 ENCOUNTER — Other Ambulatory Visit: Payer: Self-pay

## 2020-05-10 ENCOUNTER — Ambulatory Visit: Payer: BC Managed Care – PPO

## 2020-05-10 DIAGNOSIS — Z7901 Long term (current) use of anticoagulants: Secondary | ICD-10-CM

## 2020-05-10 LAB — POCT INR: INR: 5 — AB (ref 2.0–3.0)

## 2020-05-10 NOTE — Patient Instructions (Addendum)
Pre visit review using our clinic review tool, if applicable. No additional management support is needed unless otherwise documented below in the visit note.  Proposed dosing for after procedure is contingent on surgeon instructions and pt should follow surgeon instructions for couamadin first. If any changes to coumadin dosing by surgeon pt should contact this office to let the coumadin nurse know of change.  Pt will also hold dose today. Proposed dosing for after procedure. 12/17-12/22: no coumadin 12/23: take 7.5mg  coumadin 12/24: take 10mg  coumadin 12/25: take 10mg  coumadin 12/26: take 10mg  coumadin 12/27: take 10mg  coumadin 12/28: hold coumadin until recheck INR in office

## 2020-05-11 ENCOUNTER — Other Ambulatory Visit
Admission: RE | Admit: 2020-05-11 | Discharge: 2020-05-11 | Disposition: A | Discharge status: Home / Self Care | Payer: BC Managed Care – PPO | Source: Ambulatory Visit | Attending: Urology | Admitting: Urology

## 2020-05-11 DIAGNOSIS — Z01812 Encounter for preprocedural laboratory examination: Secondary | ICD-10-CM | POA: Insufficient documentation

## 2020-05-11 DIAGNOSIS — Z20822 Contact with and (suspected) exposure to covid-19: Secondary | ICD-10-CM | POA: Diagnosis not present

## 2020-05-11 LAB — SARS CORONAVIRUS 2 (TAT 6-24 HRS): SARS Coronavirus 2: NEGATIVE

## 2020-05-12 LAB — CULTURE, URINE COMPREHENSIVE

## 2020-05-14 ENCOUNTER — Telehealth: Payer: Self-pay

## 2020-05-14 NOTE — Telephone Encounter (Signed)
Pt LM on triage line with questions in regards to insurance paperwork regarding her surgery. She can be reached at [phone].

## 2020-05-14 NOTE — Telephone Encounter (Signed)
Advised patient to bring paperwork to the office to be filled out.

## 2020-05-15 ENCOUNTER — Ambulatory Visit
Admission: RE | Admit: 2020-05-15 | Discharge: 2020-05-15 | Disposition: A | Discharge status: Home Health | Payer: BC Managed Care – PPO | Attending: Urology | Admitting: Urology

## 2020-05-15 ENCOUNTER — Ambulatory Visit: Payer: BC Managed Care – PPO

## 2020-05-15 ENCOUNTER — Other Ambulatory Visit: Payer: Self-pay

## 2020-05-15 ENCOUNTER — Encounter: Payer: Self-pay | Admitting: Urology

## 2020-05-15 ENCOUNTER — Encounter
Admission: RE | Disposition: A | Discharge status: Home Health | Payer: Self-pay | Source: Home / Self Care | Attending: Urology

## 2020-05-15 ENCOUNTER — Ambulatory Visit: Payer: BC Managed Care – PPO | Admitting: Anesthesiology

## 2020-05-15 DIAGNOSIS — Z79899 Other long term (current) drug therapy: Secondary | ICD-10-CM | POA: Insufficient documentation

## 2020-05-15 DIAGNOSIS — C679 Malignant neoplasm of bladder, unspecified: Secondary | ICD-10-CM | POA: Insufficient documentation

## 2020-05-15 DIAGNOSIS — Z801 Family history of malignant neoplasm of trachea, bronchus and lung: Secondary | ICD-10-CM | POA: Insufficient documentation

## 2020-05-15 DIAGNOSIS — Z881 Allergy status to other antibiotic agents status: Secondary | ICD-10-CM | POA: Diagnosis not present

## 2020-05-15 DIAGNOSIS — C678 Malignant neoplasm of overlapping sites of bladder: Secondary | ICD-10-CM | POA: Diagnosis not present

## 2020-05-15 DIAGNOSIS — D494 Neoplasm of unspecified behavior of bladder: Secondary | ICD-10-CM

## 2020-05-15 HISTORY — PX: TRANSURETHRAL RESECTION OF BLADDER TUMOR: SHX2575

## 2020-05-15 SURGERY — TURBT (TRANSURETHRAL RESECTION OF BLADDER TUMOR)
Anesthesia: General | Site: Bladder

## 2020-05-15 MED ORDER — LIDOCAINE HCL (CARDIAC) PF 100 MG/5ML IV SOSY
PREFILLED_SYRINGE | INTRAVENOUS | Status: DC | PRN
  Administered 2020-05-15: 100 mg via INTRAVENOUS

## 2020-05-15 MED ORDER — CEFAZOLIN SODIUM-DEXTROSE 2-4 GM/100ML-% IV SOLN
2.0000 g | INTRAVENOUS | Status: AC
  Administered 2020-05-15: 10:00:00 2 g via INTRAVENOUS

## 2020-05-15 MED ORDER — DEXAMETHASONE SODIUM PHOSPHATE 10 MG/ML IJ SOLN
INTRAMUSCULAR | Status: DC | PRN
  Administered 2020-05-15: 10 mg via INTRAVENOUS

## 2020-05-15 MED ORDER — CEFAZOLIN SODIUM-DEXTROSE 2-4 GM/100ML-% IV SOLN
INTRAVENOUS | Status: AC
  Filled 2020-05-15: qty 100

## 2020-05-15 MED ORDER — FENTANYL CITRATE (PF) 100 MCG/2ML IJ SOLN
INTRAMUSCULAR | Status: AC
  Filled 2020-05-15: qty 2

## 2020-05-15 MED ORDER — MIDAZOLAM HCL 2 MG/2ML IJ SOLN
INTRAMUSCULAR | Status: AC
  Filled 2020-05-15: qty 2

## 2020-05-15 MED ORDER — FENTANYL CITRATE (PF) 100 MCG/2ML IJ SOLN
INTRAMUSCULAR | Status: DC | PRN
  Administered 2020-05-15: 50 ug via INTRAVENOUS

## 2020-05-15 MED ORDER — MIDAZOLAM HCL 2 MG/2ML IJ SOLN
INTRAMUSCULAR | Status: DC | PRN
  Administered 2020-05-15: 2 mg via INTRAVENOUS

## 2020-05-15 MED ORDER — MEPERIDINE HCL 50 MG/ML IJ SOLN: 6.2500 mg | INTRAMUSCULAR | Status: DC | PRN

## 2020-05-15 MED ORDER — FENTANYL CITRATE (PF) 100 MCG/2ML IJ SOLN
INTRAMUSCULAR | Status: AC
  Administered 2020-05-15: 11:00:00 25 ug via INTRAVENOUS
  Filled 2020-05-15: qty 2

## 2020-05-15 MED ORDER — ONDANSETRON HCL 4 MG/2ML IJ SOLN
INTRAMUSCULAR | Status: DC | PRN
  Administered 2020-05-15: 4 mg via INTRAVENOUS

## 2020-05-15 MED ORDER — CHLORHEXIDINE GLUCONATE 0.12 % MT SOLN
OROMUCOSAL | Status: AC
  Administered 2020-05-15: 09:00:00 15 mL via OROMUCOSAL
  Filled 2020-05-15: qty 15

## 2020-05-15 MED ORDER — FENTANYL CITRATE (PF) 100 MCG/2ML IJ SOLN
25.0000 ug | INTRAMUSCULAR | Status: DC | PRN
  Administered 2020-05-15: 25 ug via INTRAVENOUS

## 2020-05-15 MED ORDER — LACTATED RINGERS IV SOLN: INTRAVENOUS | Status: DC

## 2020-05-15 MED ORDER — ONDANSETRON HCL 4 MG/2ML IJ SOLN: 4.0000 mg | Freq: Once | INTRAMUSCULAR | Status: DC | PRN

## 2020-05-15 MED ORDER — PROPOFOL 10 MG/ML IV BOLUS
INTRAVENOUS | Status: DC | PRN
  Administered 2020-05-15: 150 mg via INTRAVENOUS

## 2020-05-15 MED ORDER — ORAL CARE MOUTH RINSE: 15.0000 mL | Freq: Once | OROMUCOSAL | Status: AC

## 2020-05-15 MED ORDER — CHLORHEXIDINE GLUCONATE 0.12 % MT SOLN: 15.0000 mL | Freq: Once | OROMUCOSAL | Status: AC

## 2020-05-15 SURGICAL SUPPLY — 27 items
BAG DRAIN CYSTO-URO LG1000N (MISCELLANEOUS) ×3 IMPLANT
BAG DRN RND TRDRP ANRFLXCHMBR (UROLOGICAL SUPPLIES) ×1
BAG URINE DRAIN 2000ML AR STRL (UROLOGICAL SUPPLIES) ×3 IMPLANT
CATH FOL 2WAY LX 16X5 (CATHETERS) ×2 IMPLANT
CATH FOLEY 2WAY 18X30 (CATHETERS) IMPLANT
CATH FOLEY 2WAY SIL 18X30 (CATHETERS)
DRAPE UTILITY 15X26 TOWEL STRL (DRAPES) ×3 IMPLANT
DRSG TELFA 4X3 1S NADH ST (GAUZE/BANDAGES/DRESSINGS) ×3 IMPLANT
ELECT LOOP 22F BIPOLAR SML (ELECTROSURGICAL) ×3
ELECT REM PT RETURN 9FT ADLT (ELECTROSURGICAL)
ELECTRODE LOOP 22F BIPOLAR SML (ELECTROSURGICAL) IMPLANT
ELECTRODE REM PT RTRN 9FT ADLT (ELECTROSURGICAL) IMPLANT
GLOVE BIOGEL PI IND STRL 7.5 (GLOVE) ×1 IMPLANT
GLOVE BIOGEL PI INDICATOR 7.5 (GLOVE) ×2
GOWN STRL REUS W/ TWL LRG LVL3 (GOWN DISPOSABLE) ×1 IMPLANT
GOWN STRL REUS W/TWL LRG LVL3 (GOWN DISPOSABLE) ×3
GOWN STRL REUS W/TWL XL LVL4 (GOWN DISPOSABLE) ×3 IMPLANT
IV NS IRRIG 3000ML ARTHROMATIC (IV SOLUTION) ×6 IMPLANT
KIT TURNOVER CYSTO (KITS) ×3 IMPLANT
LOOP CUT BIPOLAR 24F LRG (ELECTROSURGICAL) IMPLANT
MANIFOLD NEPTUNE II (INSTRUMENTS) ×3 IMPLANT
PACK CYSTO AR (MISCELLANEOUS) ×3 IMPLANT
SET IRRIG Y TYPE TUR BLADDER L (SET/KITS/TRAYS/PACK) ×3 IMPLANT
SURGILUBE 2OZ TUBE FLIPTOP (MISCELLANEOUS) ×3 IMPLANT
SYR TOOMEY IRRIG 70ML (MISCELLANEOUS) ×3
SYRINGE TOOMEY IRRIG 70ML (MISCELLANEOUS) ×1 IMPLANT
WATER STERILE IRR 1000ML POUR (IV SOLUTION) ×3 IMPLANT

## 2020-05-15 NOTE — Discharge Instructions (Signed)
AMBULATORY SURGERY  DISCHARGE INSTRUCTIONS   1) The drugs that you were given will stay in your system until tomorrow so for the next 24 hours you should not:  A) Drive an automobile B) Make any legal decisions C) Drink any alcoholic beverage   2) You may resume regular meals tomorrow.  Today it is better to start with liquids and gradually work up to solid foods.  You may eat anything you prefer, but it is better to start with liquids, then soup and crackers, and gradually work up to solid foods.   3) Please notify your doctor immediately if you have any unusual bleeding, trouble breathing, redness and pain at the surgery site, drainage, fever, or pain not relieved by medication. 4)   Office will call to schedule your post op appointment  5) Additional Instructions:      Transurethral Resection of Bladder Tumor (TURBT) or Bladder Biopsy   Definition:  Transurethral Resection of the Bladder Tumor is a surgical procedure used to diagnose and remove tumors within the bladder.   General instructions:     Your recent bladder surgery requires very little post hospital care but some definite precautions.  Despite the fact that no skin incisions were used, the area around the bladder incisions are raw and covered with scabs to promote healing and prevent bleeding. Certain precautions are needed to insure that the scabs are not disturbed over the next 2-4 weeks while the healing proceeds.  Because the raw surface inside your bladder and the irritating effects of urine you may expect frequency of urination and/or urgency (a stronger desire to urinate) and perhaps even getting up at night more often. This will usually resolve or improve slowly over the healing period. You may see some blood in your urine over the first 6 weeks. Do not be alarmed, even if the urine was clear for a while. Get off your feet and drink lots of fluids until clearing occurs. If you start to pass clots or don't  improve call us.  Diet:  You may return to your normal diet immediately. Because of the raw surface of your bladder, alcohol, spicy foods, foods high in acid and drinks with caffeine may cause irritation or frequency and should be used in moderation. To keep your urine flowing freely and avoid constipation, drink plenty of fluids during the day (8-10 glasses). Tip: Avoid cranberry juice because it is very acidic.  Activity:  Your physical activity doesn't need to be restricted. However, if you are very active, you may see some blood in the urine. We suggest that you reduce your activity under the circumstances until the bleeding has stopped.  Bowels:  It is important to keep your bowels regular during the postoperative period. Straining with bowel movements can cause bleeding. A bowel movement every other day is reasonable. Use a mild laxative if needed, such as milk of magnesia 2-3 tablespoons, or 2 Dulcolax tablets. Call if you continue to have problems. If you had been taking narcotics for pain, before, during or after your surgery, you may be constipated. Take a laxative if necessary.    Medication:  You should resume your pre-surgery medications unless told not to. In addition you may be given an antibiotic to prevent or treat infection. Antibiotics are not always necessary. All medication should be taken as prescribed until the bottles are finished unless you are having an unusual reaction to one of the drugs.  You may resume Coumadin 05/16/2020 if urine clear to  pink.  Follow-up: You will be contacted with your pathology results and follow-up will be scheduled after review.   [facility] [address] [place], [place] [address] [phone]

## 2020-05-15 NOTE — Op Note (Signed)
Preoperative diagnosis: 1. T1 urothelial carcinoma bladder, high-grade  Postoperative diagnosis:  1. Same  Procedure:  1. Cystoscopy 2. Restaging transurethral resection of bladder tumor (2-5 cm)  Surgeon: Nicki Reaper C. Laquana Villari, M.D.  Anesthesia: General  Complications: None  Intraoperative findings:  1. Bladder tumor: No remaining visible tumor. Deeper resections were taken of the tumor bed encompassing an area of ~3 cm   EBL: Minimal  Specimens: 1. Resection bladder tumor bed      Indication: Theresa Lewis is a 59 y.o. 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 She presents for restaging TURBT. After reviewing the management options for treatment, he elected to proceed with the above surgical procedure(s). We have discussed the potential benefits and risks of the procedure, side effects of the proposed treatment, the likelihood of the patient achieving the goals of the procedure, and any potential problems that might occur during the procedure or recuperation. Informed consent has been obtained.  Description of procedure:  The patient was taken to the operating room and general anesthesia was induced.  The patient was placed in the dorsal lithotomy position, prepped and draped in the usual sterile fashion, and preoperative antibiotics were administered. A preoperative time-out was performed.   A 26 French continuous-flow resectoscope sheath with obturator was lubricated and passed per urethra. An Iglesias resectoscope with a small loop was then placed in the sheath and panendoscopy was performed with findings as described above. There was fibrinous exudate noted over the tumor bed.  Using bipolar current areas of the tumor bed were resected encompassing an area of ~3 cm.  Hemostasis was obtained with cautery. All specimen was removed via irrigation.  At the completion of  procedure hemostasis was adequate. The bladder was emptied and the resectoscope was removed. A 16 French Foley catheter was placed with return of clear effluent upon irrigation. After anesthetic reversal she was transported to the PACU in stable condition.  Plan:  Foley catheter will be removed prior to discharge  She will be contacted with the pathology results and if restaging TURBT confirms T1 disease she will undergo a 6-week course of intravesical immunotherapy   Kolson Chovanec C. Bernardo Heater,  MD

## 2020-05-15 NOTE — Progress Notes (Signed)
   05/15/20 0908  Clinical Encounter Type  Visited With Patient;Family  Visit Type Initial  Referral From Chaplain  Consult/Referral To [place] responded to a page. Pt was nervous about her procedure. When chaplain arrived to the room, she discovered that she met and prayed with this Pt before. Pt reminded chaplain that she talked with her and her husband several days ago. Pt explained what is going on with her and she is nervous. Chaplain. Pt, and nurse held hands and prayed for her. After leaving the room, chaplain went to the waiting area, spoke with, and prayed with Pt's husband. Chaplain will follow up with Pt.

## 2020-05-15 NOTE — Anesthesia Postprocedure Evaluation (Signed)
Anesthesia Post Note  Patient: Theresa Lewis  Procedure(s) Performed: TRANSURETHRAL RESECTION OF BLADDER TUMOR (TURBT) (N/A Bladder)  Patient location during evaluation: PACU Anesthesia Type: General Level of consciousness: awake and alert, awake and oriented Pain management: pain level controlled Vital Signs Assessment: post-procedure vital signs reviewed and stable Respiratory status: spontaneous breathing, nonlabored ventilation and respiratory function stable Cardiovascular status: blood pressure returned to baseline and stable Postop Assessment: no apparent nausea or vomiting Anesthetic complications: no   No complications documented.   Last Vitals:  Vitals:   05/15/20 1157 05/15/20 1215  BP: (!) 156/86 125/76  Pulse: (!) 47 (!) 59  Resp: 10 16  Temp: (!) 36.1 C 36.4 C  SpO2: 100% 97%    Last Pain:  Vitals:   05/15/20 1215  TempSrc: Oral  PainSc: 0-No pain                 Phill Mutter

## 2020-05-15 NOTE — Anesthesia Preprocedure Evaluation (Signed)
Anesthesia Evaluation  Patient identified by MRN, date of birth, ID band Patient awake    Reviewed: Allergy & Precautions, NPO status , Patient's Chart, lab work & pertinent test results  Airway Mallampati: II  TM Distance: >3 FB Neck ROM: Full    Dental no notable dental hx.    Pulmonary neg pulmonary ROS,    Pulmonary exam normal breath sounds clear to auscultation       Cardiovascular negative cardio ROS Normal cardiovascular exam Rhythm:Regular Rate:Normal     Neuro/Psych  Neuromuscular disease negative psych ROS   GI/Hepatic Neg liver ROS, GERD  ,  Endo/Other  negative endocrine ROS  Renal/GU negative Renal ROS  negative genitourinary   Musculoskeletal negative musculoskeletal ROS (+)   Abdominal   Peds negative pediatric ROS (+)  Hematology negative hematology ROS (+)   Anesthesia Other Findings Past Medical History: 04/19/2007: ABNORMAL VAGINAL BLEEDING     Comment:  Qualifier: Diagnosis of  By: Hulan Saas, CMA (AAMA),               [place]arene [place] S  No date: Complication of anesthesia     Comment:  stays asleep for a "very long time" No date: Deep venous thrombosis ([place])     Comment:  2006    3 x clotting  No date: GERD (gastroesophageal reflux disease) 12/26/2008: HEMORRHOIDS, WITH BLEEDING     Comment:  Qualifier: Diagnosis of  By: Regis Bill MD, Standley Brooking  07/09/2010: PE (pulmonary embolism) No date: Pre-diabetes No date: Pulmonary embolism ([facility])   Reproductive/Obstetrics negative OB ROS                             Anesthesia Physical  Anesthesia Plan  ASA: II  Anesthesia Plan: General   Post-op Pain Management:    Induction: Intravenous  PONV Risk Score and Plan: 3 and Ondansetron and Dexamethasone  Airway Management Planned: LMA  Additional Equipment:   Intra-op Plan:   Post-operative Plan:   Informed Consent: I have reviewed the patients History and Physical, chart,  labs and discussed the procedure including the risks, benefits and alternatives for the proposed anesthesia with the patient or authorized representative who has indicated his/her understanding and acceptance.       Plan Discussed with: CRNA, Anesthesiologist and Surgeon  Anesthesia Plan Comments:         Anesthesia Quick Evaluation

## 2020-05-15 NOTE — Transfer of Care (Signed)
Immediate Anesthesia Transfer of Care Note  Patient: Theresa Lewis  Procedure(s) Performed: TRANSURETHRAL RESECTION OF BLADDER TUMOR (TURBT) (N/A Bladder)  Patient Location: PACU  Anesthesia Type:General  Level of Consciousness: awake, alert  and oriented  Airway & Oxygen Therapy: Patient Spontanous Breathing and Patient connected to face mask oxygen  Post-op Assessment: Report given to RN and Post -op Vital signs reviewed and stable  Post vital signs: Reviewed and stable  Last Vitals:  Vitals Value Taken Time  BP 152/81 05/15/20 1110  Temp 36 C 05/15/20 1110  Pulse 54 05/15/20 1112  Resp 13 05/15/20 1112  SpO2 100 % 05/15/20 1112  Vitals shown include unvalidated device data.  Last Pain:  Vitals:   05/15/20 0855  TempSrc: Oral  PainSc: 0-No pain         Complications: No complications documented.

## 2020-05-15 NOTE — OR Nursing (Signed)
Oked per MD stoioff for patient to be discharged before voiding. Continue to monitor

## 2020-05-15 NOTE — H&P (Signed)
Urology H&P  History of Present Illness: Theresa Lewis is a 59 y.o. 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  She presents today for restaging TURBT.  Past Medical History:  Diagnosis Date  . ABNORMAL VAGINAL BLEEDING 04/19/2007   Qualifier: Diagnosis of  By: Hulan Saas, CMA (AAMA), [place]   . Complication of anesthesia    stays asleep for a "very long time"  . Deep venous thrombosis ([place])    2006    3 x clotting   . GERD (gastroesophageal reflux disease)   . HEMORRHOIDS, WITH BLEEDING 12/26/2008   Qualifier: Diagnosis of  By: Regis Bill MD, Standley Brooking   . PE (pulmonary embolism) 07/09/2010  . Pre-diabetes   . Pulmonary embolism [facility])     Past Surgical History:  Procedure Laterality Date  . MYOMECTOMY    . rectal tear    . TRANSURETHRAL RESECTION OF BLADDER TUMOR N/A 04/24/2020   Procedure: TRANSURETHRAL RESECTION OF BLADDER TUMOR (TURBT);  Surgeon: Abbie Sons, MD;  Location: [facility] ORS;  Service: Urology;  Laterality: N/A;  . TUBAL LIGATION    . vena caval filter      Home Medications:  Current Meds  Medication Sig  . docusate sodium (COLACE) 100 MG capsule Take 100 mg by mouth daily as needed for mild constipation.  . gabapentin (NEURONTIN) 100 MG capsule Take 1 capsule by mouth three times weekly as needed. (Patient taking differently: Take 100 mg by mouth every Tuesday, Thursday, and Saturday at 6 PM. .)  . omeprazole (PRILOSEC) 20 MG capsule Take 1 capsule by mouth three times weekly. (Patient taking differently: Take 20 mg by mouth every Monday, Wednesday, and Friday.)  . polyethylene glycol (MIRALAX / GLYCOLAX) 17 g packet Take 17 g by mouth daily as needed for moderate constipation.    Allergies:  Allergies  Allergen Reactions  . Clindamycin/Lincomycin     "unknown"  . Tape Other (See Comments)    When pt has a band-aid on, the impression of band-aid stays  on for a month    Family History  Problem Relation Age of Onset  . Lung cancer Father 73       april 12   . Pancreatic cancer Father 79  . Hypertension Mother   . Rheum arthritis Mother   . Diabetes Paternal Aunt        x2  . Colon cancer Neg Hx   . Colon polyps Neg Hx   . Kidney disease Neg Hx   . Esophageal cancer Neg Hx   . Gallbladder disease Neg Hx   . Rectal cancer Neg Hx   . Stomach cancer Neg Hx     Social History:  reports that she has never smoked. She has never used smokeless tobacco. She reports current alcohol use. She reports that she does not use drugs.  ROS: A complete review of systems was performed.  All systems are negative except for pertinent findings as noted.  Physical Exam:  Vital signs in last 24 hours: Temp:  [97.5 F (36.4 C)] 97.5 F (36.4 C) (12/21 0855) Pulse Rate:  [55] 55 (12/21 0855) Resp:  [16] 16 (12/21 0855) BP: (146)/(78) 146/78 (12/21 0855) SpO2:  [100 %] 100 % (12/21 0855) Weight:  [99.3 kg] 99.3 kg (12/21 0855) Constitutional:  Alert and oriented, No acute distress HEENT: [place] AT, moist mucus membranes.  Trachea midline, no masses Cardiovascular: Regular rate  and rhythm, no clubbing, cyanosis, or edema. Respiratory: Normal respiratory effort, lungs clear bilaterally  Laboratory Data:  No results for input(s): WBC, HGB, HCT in the last 72 hours. No results for input(s): NA, K, CL, CO2, GLUCOSE, BUN, CREATININE, CALCIUM in the last 72 hours. No results for input(s): LABPT, INR in the last 72 hours. No results for input(s): LABURIN in the last 72 hours. Results for orders placed or performed during the hospital encounter of 05/11/20  SARS CORONAVIRUS 2 (TAT 6-24 HRS) Nasopharyngeal Nasopharyngeal Swab     Status: None   Collection Time: 05/11/20  1:00 PM   Specimen: Nasopharyngeal Swab  Result Value Ref Range Status   SARS Coronavirus 2 NEGATIVE NEGATIVE Final    Comment: (NOTE) SARS-CoV-2 target nucleic acids are NOT  DETECTED.  The SARS-CoV-2 RNA is generally detectable in upper and lower respiratory specimens during the acute phase of infection. Negative results do not preclude SARS-CoV-2 infection, do not rule out co-infections with other pathogens, and should not be used as the sole basis for treatment or other patient management decisions. Negative results must be combined with clinical observations, patient history, and epidemiological information. The expected result is Negative.  Fact Sheet for Patients: SugarRoll.be  Fact Sheet for Healthcare Providers: https://www.woods-mathews.com/  This test is not yet approved or cleared by the [place] FDA and  has been authorized for detection and/or diagnosis of SARS-CoV-2 by FDA under an Emergency Use Authorization (EUA). This EUA will remain  in effect (meaning this test can be used) for the duration of the COVID-19 declaration under Se ction 564(b)(1) of the Act, 21 U.S.C. section 360bbb-3(b)(1), unless the authorization is terminated or revoked sooner.  Performed at [facility], [place] [address], [place], [place] [address]      Impression/Assessment:   T1 high-grade urothelial carcinoma the bladder  Plan:   Restaging TURBT.  The procedure was discussed in detail clean potential risks of bleeding, infection, bladder injury as well as anesthetic risks.  All questions were answered.  05/15/2020, 10:13 AM  John Giovanni,  MD

## 2020-05-15 NOTE — Anesthesia Procedure Notes (Signed)
Procedure Name: LMA Insertion Date/Time: 05/15/2020 10:34 AM Performed by: Nelda Marseille, CRNA Pre-anesthesia Checklist: Patient identified, Patient being monitored, Timeout performed, Emergency Drugs available and Suction available Patient Re-evaluated:Patient Re-evaluated prior to induction Oxygen Delivery Method: Circle system utilized Preoxygenation: Pre-oxygenation with 100% oxygen Induction Type: IV induction Ventilation: Mask ventilation without difficulty LMA: LMA inserted LMA Size: 4.0 Tube type: Oral Number of attempts: 1 Placement Confirmation: positive ETCO2 and breath sounds checked- equal and bilateral Tube secured with: Tape Dental Injury: Teeth and Oropharynx as per pre-operative assessment

## 2020-05-16 LAB — SURGICAL PATHOLOGY

## 2020-05-17 ENCOUNTER — Encounter: Payer: Self-pay | Admitting: Urology

## 2020-05-21 ENCOUNTER — Telehealth: Payer: Self-pay

## 2020-05-21 NOTE — Telephone Encounter (Signed)
-----   Message from Riki Altes, MD sent at 05/17/2020  8:53 AM EST ----- Regarding: BCG Please schedule induction BCG in 3-4 weeks. Thanks ----- Message ----- From: Interface, Lab In [facility] Sent: 05/16/2020   9:00 AM EST To: Riki Altes, MD

## 2020-05-21 NOTE — Telephone Encounter (Signed)
-----   Message from Scott C Stoioff, MD sent at 05/17/2020  8:53 AM EST ----- Regarding: BCG Please schedule induction BCG in 3-4 weeks. Thanks ----- Message ----- From: Interface, Lab In [facility] Sent: 05/16/2020   9:00 AM EST To: Scott C Stoioff, MD   

## 2020-05-22 ENCOUNTER — Ambulatory Visit: Payer: BC Managed Care – PPO

## 2020-05-24 ENCOUNTER — Other Ambulatory Visit: Payer: Self-pay

## 2020-05-24 ENCOUNTER — Ambulatory Visit: Payer: BC Managed Care – PPO

## 2020-05-24 DIAGNOSIS — Z7901 Long term (current) use of anticoagulants: Secondary | ICD-10-CM

## 2020-05-24 LAB — POCT INR: INR: 2.4 (ref 2.0–3.0)

## 2020-05-24 NOTE — Patient Instructions (Addendum)
Pre visit review using our clinic review tool, if applicable. No additional management support is needed unless otherwise documented below in the visit note.  Increase dose today to 10mg  then continue taking 7.5mg  daily except take 10mg  on Wednesdays. Recheck in 4 wks.

## 2020-05-28 NOTE — Telephone Encounter (Signed)
Patient notified and given detailed BCG instruction (mychart message sent with BCG education) Patient verbalized understanding. Appointments made for induction starting 06/29/20 and 21mo cysto to follow

## 2020-06-21 ENCOUNTER — Ambulatory Visit: Payer: BC Managed Care – PPO

## 2020-06-21 ENCOUNTER — Other Ambulatory Visit: Payer: Self-pay

## 2020-06-21 DIAGNOSIS — Z7901 Long term (current) use of anticoagulants: Secondary | ICD-10-CM | POA: Diagnosis not present

## 2020-06-21 LAB — POCT INR: INR: 2.1 (ref 2.0–3.0)

## 2020-06-21 NOTE — Patient Instructions (Addendum)
Pre visit review using our clinic review tool, if applicable. No additional management support is needed unless otherwise documented below in the visit note.  Increase dose today to 15mg  and increase dose tomorrow to 10mg  then continue taking 7.5mg  daily except take 10mg  on Wednesdays. Recheck in 4 wks.

## 2020-06-29 ENCOUNTER — Other Ambulatory Visit: Payer: Self-pay

## 2020-06-29 ENCOUNTER — Ambulatory Visit: Payer: BC Managed Care – PPO | Admitting: Physician Assistant

## 2020-06-29 DIAGNOSIS — C679 Malignant neoplasm of bladder, unspecified: Secondary | ICD-10-CM | POA: Diagnosis not present

## 2020-06-29 MED ORDER — BCG LIVE 50 MG IS SUSR
3.2400 mL | Freq: Once | INTRAVESICAL | Status: AC
  Administered 2020-06-29: 81 mg via INTRAVESICAL

## 2020-06-29 NOTE — Patient Instructions (Signed)
Right now through 3:50pm: Hold your urine and do your quarter turns every 15 minutes. 3:50pm-9:50pm today: Every time you urinate, pour 1/2 cup of bleach into the toilet and let it sit for 15 minutes prior to flushing. 9:50pm onward: Resume your normal routine.  Patient Education: (BCG) Into the Bladder (Intravesical Chemotherapy)  BCG is a vaccine which is used to prevent tuberculosis (TB).  But it's also a helpful treatment for some early bladder cancers.  When BCG goes directly into the bladder the treatment is described as intravesical.  BCG is a type of immunotherapy.  Immunotherapy stimulates the body's immune system to destroy cancer cells.  How it's given BCG treatment is given to you in an outpatient setting.  It takes a few minutes to administer and you can go home as soon as it's finished.  It might be a good idea to ask someone to bring you, particularly the fist time.  Unlike chemotherapy into the bladder, BCG treatment is never given immediately after surgery to remove bladder tumors.  There needs to be a delay usually of at least two weeks after surgery, before you can have it.  You won't be given treatment with BCG if you are unwell or have an infection in your urine.  You're usually asked to limit the amount you drink before your treatment.  This will help to increase the concentration of BCG in your bladder.  Drinking too much before your treatment may make your bladder feel uncomfortably full.  If you normally take water tablets (diuretics) take them later in the day after your treatment.  Your nurse or doctor will give you more advise about preparing for your treatment.  You will have a small tube (catheter) placed into your bladder.  Your doctor will then put the liquid vaccine directly into your bladder through the catheter and remove the catheter.  You will need to hold your urine for two hours afterwards.  This can be difficult but it's to give the treatment time to work.   You can walk around during this time.  When the treatment is over you can go to the toilet.  After your treatment there are some precautions you'll need to take.  This is because BCG is a live vaccine and other people shouldn't be exposed to it.  For the next six hours, you'll need to avoid your urine splashing on the toilet seat and getting any urine on your hands.  It might be easer for men to sit down when they're using an ordinary toilet although using a stand up urinal should be alright.  The main this is to avoid splashing urine and spreading the vaccine.  You will also be asked to put 1/2 cup undiluted bleach into the toilet to destroy any live vaccine and leave it for 15 minutes until you flush.  Side Effects Because BCG goes directly into the bladder most of the side effects are linked with the bladder.  They usually go away within one to two days after your treatment.  The most common ones are: -needing to pass urine often -pain when you pass urine -blood in urine -flu-like symptoms (tiredness, general aching and raised temperature)  Theses side effects should settle down within a day or two.  If they don't get better contact your doctor.  Drinking lots of fluids can help flush the drug out of your bladder and reduce some of these effects.  Taking Ibuprofen or Aleve is encouraged unless you have a condition  that would make these medications unsafe to take (renal failure, diabetes, gerd)  Rare side effects can include a continuing high temperature (fever), pain in your joints and a cough.  If you have any of these symptoms, or if you feel generally unwell, contact your doctor.  These symptoms could be a sign of a more serious infection (due to BCG) that needs to be treated immediately.  If this happens you'll be treated with the same drugs (antibiotics) that are used to treat TB.  Contraception Men should use a condom during sex for the first 48 hours after their treatment.  If you are a  women who has had BCG treatment then your partner should use a condom.  Using a condom will protect your partner from any vaccine present in your semen or vaginal fluid.  We don't know how BCG may affect a developing fetus so it's not advisable to become pregnant or father a child while having it.  It is important to use effective contraception during your treatment and for six weeks afterwards.  You can discuss this with your doctor or specialist nurse.

## 2020-06-29 NOTE — Progress Notes (Signed)
BCG Bladder Instillation  BCG # 1 of 6  Due to Bladder Cancer patient is present today for a BCG treatment. Patient was cleaned and prepped in a sterile fashion with betadine. A 14FR catheter was inserted, urine return was noted <5ml, urine was yellow in color.  50ml of reconstituted BCG was instilled into the bladder. The catheter was then removed. Patient tolerated well, no complications were noted  Performed by: Rolf Fells, PA-C and Crysta Albright, CMA  Follow up/ Additional notes: Patient remained in clinic for 30 minutes following instillation today for monitoring of hypersensitivity reaction; none noted.  We reviewed post-instillation instructions including holding the urine for 2 hours with quarter turns every 15 minutes and pouring bleach into the toilet with subsequent voids for 6 hours. Written instructions also provided today. She expressed understanding.    

## 2020-07-02 LAB — URINALYSIS, COMPLETE
Bilirubin, UA: NEGATIVE
Glucose, UA: NEGATIVE
Nitrite, UA: NEGATIVE
RBC, UA: NEGATIVE
Specific Gravity, UA: >1.03 — ABNORMAL HIGH (ref 1.005–1.030)
Urobilinogen, Ur: 0.2 mg/dL (ref 0.2–1.0)
pH, UA: 5 (ref 5.0–7.5)

## 2020-07-02 LAB — MICROSCOPIC EXAMINATION

## 2020-07-06 ENCOUNTER — Ambulatory Visit (INDEPENDENT_AMBULATORY_CARE_PROVIDER_SITE_OTHER): Payer: BC Managed Care – PPO | Admitting: Physician Assistant

## 2020-07-06 ENCOUNTER — Other Ambulatory Visit: Payer: Self-pay

## 2020-07-06 DIAGNOSIS — C679 Malignant neoplasm of bladder, unspecified: Secondary | ICD-10-CM | POA: Diagnosis not present

## 2020-07-06 LAB — URINALYSIS, COMPLETE
Bilirubin, UA: NEGATIVE
Glucose, UA: NEGATIVE
Ketones, UA: NEGATIVE
Leukocytes,UA: NEGATIVE
Nitrite, UA: NEGATIVE
RBC, UA: NEGATIVE
Specific Gravity, UA: >1.03 — ABNORMAL HIGH (ref 1.005–1.030)
Urobilinogen, Ur: 0.2 mg/dL (ref 0.2–1.0)
pH, UA: 5 (ref 5.0–7.5)

## 2020-07-06 LAB — MICROSCOPIC EXAMINATION: Epithelial Cells (non renal): >10 /hpf — AB (ref 0–10)

## 2020-07-06 MED ORDER — BCG LIVE 50 MG IS SUSR
3.2400 mL | Freq: Once | INTRAVESICAL | Status: AC
  Administered 2020-07-06: 81 mg via INTRAVESICAL

## 2020-07-06 NOTE — Progress Notes (Signed)
BCG Bladder Instillation  BCG # 2 of 6  Due to Bladder Cancer patient is present today for a BCG treatment. Patient was cleaned and prepped in a sterile fashion with betadine. A 14FR catheter was inserted, urine return was noted 50ml, urine was yellow in color.  7ml of reconstituted BCG was instilled into the bladder. The catheter was then removed. Patient tolerated well, no complications were noted  Preformed by: Debroah Loop, PA-C and Bradly Bienenstock, CMA  Follow up/ Additional notes: 1 week for BCG #3 of 6

## 2020-07-12 NOTE — Progress Notes (Unsigned)
BCG Bladder Instillation  BCG # 3 of 6  Due to Bladder Cancer patient is present today for a BCG treatment. Patient was cleaned and prepped in a sterile fashion with betadine. A 14 FR catheter was inserted, urine return was noted 50 ml, urine was yellow clear in color.  72ml of reconstituted BCG was instilled into the bladder. The catheter was then removed. Patient tolerated well, no complications were noted  Performed by: Zara Council, PA-C and Kerman Passey, CMA  Follow up/ Additional notes: One week for 4th BCG

## 2020-07-13 ENCOUNTER — Ambulatory Visit (INDEPENDENT_AMBULATORY_CARE_PROVIDER_SITE_OTHER): Payer: BC Managed Care – PPO | Admitting: Urology

## 2020-07-13 ENCOUNTER — Ambulatory Visit: Payer: Self-pay | Admitting: Physician Assistant

## 2020-07-13 ENCOUNTER — Other Ambulatory Visit: Payer: Self-pay

## 2020-07-13 DIAGNOSIS — C679 Malignant neoplasm of bladder, unspecified: Secondary | ICD-10-CM

## 2020-07-13 LAB — MICROSCOPIC EXAMINATION: Bacteria, UA: NONE SEEN

## 2020-07-13 LAB — URINALYSIS, COMPLETE
Bilirubin, UA: NEGATIVE
Glucose, UA: NEGATIVE
Ketones, UA: NEGATIVE
Leukocytes,UA: NEGATIVE
Nitrite, UA: NEGATIVE
Protein,UA: NEGATIVE
RBC, UA: NEGATIVE
Specific Gravity, UA: >1.03 — ABNORMAL HIGH (ref 1.005–1.030)
Urobilinogen, Ur: 0.2 mg/dL (ref 0.2–1.0)
pH, UA: 5.5 (ref 5.0–7.5)

## 2020-07-13 MED ORDER — BCG LIVE 50 MG IS SUSR
3.2400 mL | Freq: Once | INTRAVESICAL | Status: AC
  Administered 2020-07-13: 50 mg via INTRAVESICAL

## 2020-07-19 ENCOUNTER — Ambulatory Visit: Payer: BC Managed Care – PPO

## 2020-07-19 ENCOUNTER — Other Ambulatory Visit: Payer: Self-pay

## 2020-07-19 DIAGNOSIS — Z7901 Long term (current) use of anticoagulants: Secondary | ICD-10-CM | POA: Diagnosis not present

## 2020-07-19 LAB — POCT INR: INR: 1.9 — AB (ref 2.0–3.0)

## 2020-07-19 NOTE — Patient Instructions (Addendum)
Pre visit review using our clinic review tool, if applicable. No additional management support is needed unless otherwise documented below in the visit note.  Increase dose to take 3 tablets (15 mg) today and then change weekly dose to take 1.5 tablets (7.5 mg) everyday EXCEPT take 2 tablets (10 mg) on Monday, Wednesday and Fridays. Re-check in 3 weeks.

## 2020-07-20 ENCOUNTER — Ambulatory Visit (INDEPENDENT_AMBULATORY_CARE_PROVIDER_SITE_OTHER): Payer: BC Managed Care – PPO

## 2020-07-20 ENCOUNTER — Other Ambulatory Visit: Payer: Self-pay

## 2020-07-20 DIAGNOSIS — C679 Malignant neoplasm of bladder, unspecified: Secondary | ICD-10-CM

## 2020-07-20 LAB — URINALYSIS, COMPLETE
Bilirubin, UA: NEGATIVE
Glucose, UA: NEGATIVE
Ketones, UA: NEGATIVE
Leukocytes,UA: NEGATIVE
Nitrite, UA: NEGATIVE
RBC, UA: NEGATIVE
Specific Gravity, UA: >1.03 — ABNORMAL HIGH (ref 1.005–1.030)
Urobilinogen, Ur: 0.2 mg/dL (ref 0.2–1.0)
pH, UA: 6.5 (ref 5.0–7.5)

## 2020-07-20 LAB — MICROSCOPIC EXAMINATION: Bacteria, UA: NONE SEEN

## 2020-07-20 MED ORDER — BCG LIVE 50 MG IS SUSR
3.2400 mL | Freq: Once | INTRAVESICAL | Status: AC
  Administered 2020-07-20: 81 mg via INTRAVESICAL

## 2020-07-20 NOTE — Progress Notes (Signed)
BCG Bladder Instillation  BCG # 4 of 6   Due to Bladder Cancer patient is present today for a BCG treatment. Patient was cleaned and prepped in a sterile fashion with betadine. A 14FR catheter was inserted, urine return was noted 80ml, urine was yellow in color.  65ml of reconstituted BCG was instilled into the bladder. The catheter was then removed. Patient tolerated well, no complications were noted  Preformed by: Fonnie Jarvis, [place], CMA  Follow up/ Additional notes: 1 wk

## 2020-07-22 ENCOUNTER — Other Ambulatory Visit: Payer: Self-pay | Admitting: Primary Care

## 2020-07-22 DIAGNOSIS — K219 Gastro-esophageal reflux disease without esophagitis: Secondary | ICD-10-CM

## 2020-07-27 ENCOUNTER — Encounter: Payer: Self-pay | Admitting: Physician Assistant

## 2020-07-27 ENCOUNTER — Other Ambulatory Visit: Payer: Self-pay

## 2020-07-27 ENCOUNTER — Ambulatory Visit: Payer: BC Managed Care – PPO | Admitting: Physician Assistant

## 2020-07-27 VITALS — Ht 65.0 in | Wt 255.0 lb

## 2020-07-27 DIAGNOSIS — C679 Malignant neoplasm of bladder, unspecified: Secondary | ICD-10-CM | POA: Diagnosis not present

## 2020-07-27 LAB — URINALYSIS, COMPLETE
Bilirubin, UA: NEGATIVE
Glucose, UA: NEGATIVE
Leukocytes,UA: NEGATIVE
Nitrite, UA: NEGATIVE
Protein,UA: NEGATIVE
RBC, UA: NEGATIVE
Specific Gravity, UA: 1.025 (ref 1.005–1.030)
Urobilinogen, Ur: 0.2 mg/dL (ref 0.2–1.0)
pH, UA: 5.5 (ref 5.0–7.5)

## 2020-07-27 LAB — MICROSCOPIC EXAMINATION

## 2020-07-27 MED ORDER — BCG LIVE 50 MG IS SUSR
3.2400 mL | Freq: Once | INTRAVESICAL | Status: AC
  Administered 2020-07-27: 81 mg via INTRAVESICAL

## 2020-07-27 MED ORDER — BCG LIVE 50 MG IS SUSR
3.2400 mL | Freq: Once | INTRAVESICAL | Status: DC
Start: 2020-07-27 — End: 2020-07-27

## 2020-07-27 NOTE — Progress Notes (Signed)
BCG Bladder Instillation  BCG # 5 of 6  Due to Bladder Cancer patient is present today for a BCG treatment. Patient was cleaned and prepped in a sterile fashion with betadine. A 14FR catheter was inserted, urine return was noted 48ml, urine was yellow in color.  5ml of reconstituted BCG was instilled into the bladder. The catheter was then removed. Patient tolerated well, no complications were noted  Preformed by: Debroah Loop, PA-C and [place], CMA  Follow up/ Additional notes: 1 week for BCG #6 of 6

## 2020-08-03 ENCOUNTER — Encounter: Payer: Self-pay | Admitting: Physician Assistant

## 2020-08-03 ENCOUNTER — Other Ambulatory Visit: Payer: Self-pay

## 2020-08-03 ENCOUNTER — Ambulatory Visit (INDEPENDENT_AMBULATORY_CARE_PROVIDER_SITE_OTHER): Payer: BC Managed Care – PPO | Admitting: Physician Assistant

## 2020-08-03 VITALS — BP 134/82 | HR 74

## 2020-08-03 DIAGNOSIS — C679 Malignant neoplasm of bladder, unspecified: Secondary | ICD-10-CM

## 2020-08-03 LAB — MICROSCOPIC EXAMINATION

## 2020-08-03 LAB — URINALYSIS, COMPLETE
Bilirubin, UA: NEGATIVE
Glucose, UA: NEGATIVE
Leukocytes,UA: NEGATIVE
Nitrite, UA: NEGATIVE
Protein,UA: NEGATIVE
RBC, UA: NEGATIVE
Specific Gravity, UA: 1.025 (ref 1.005–1.030)
Urobilinogen, Ur: 0.2 mg/dL (ref 0.2–1.0)
pH, UA: 6 (ref 5.0–7.5)

## 2020-08-03 MED ORDER — BCG LIVE 50 MG IS SUSR
3.2400 mL | Freq: Once | INTRAVESICAL | Status: AC
Start: 2020-08-03 — End: 2020-08-03
  Administered 2020-08-03: 81 mg via INTRAVESICAL

## 2020-08-03 NOTE — Progress Notes (Signed)
BCG Bladder Instillation  BCG # 6 of 6  Due to Bladder Cancer patient is present today for a BCG treatment. Patient was cleaned and prepped in a sterile fashion with betadine. A 14FR catheter was inserted, urine return was noted 45ml, urine was yellow in color.  42ml of reconstituted BCG was instilled into the bladder. The catheter was then removed. Patient tolerated well, no complications were noted  Performed by: Debroah Loop, PA-C and Verlene Mayer, CMA  Follow up/ Additional notes: 3 month cysto with Dr. Bernardo Heater

## 2020-08-09 ENCOUNTER — Other Ambulatory Visit: Payer: Self-pay

## 2020-08-09 ENCOUNTER — Ambulatory Visit: Payer: BC Managed Care – PPO

## 2020-08-09 DIAGNOSIS — Z7901 Long term (current) use of anticoagulants: Secondary | ICD-10-CM

## 2020-08-09 LAB — POCT INR: INR: 2.9 (ref 2.0–3.0)

## 2020-08-09 NOTE — Patient Instructions (Addendum)
Pre visit review using our clinic review tool, if applicable. No additional management support is needed unless otherwise documented below in the visit note.  Patient recently started chemotherapy. Continue to take 1.5 tablets (7.5 mg) everyday EXCEPT take 2 tablets (10 mg) on Monday, Wednesday and Fridays. Re-check in 4 weeks.

## 2020-08-10 ENCOUNTER — Ambulatory Visit: Payer: BC Managed Care – PPO | Admitting: Physician Assistant

## 2020-09-06 ENCOUNTER — Other Ambulatory Visit: Payer: Self-pay

## 2020-09-06 ENCOUNTER — Ambulatory Visit: Payer: BC Managed Care – PPO

## 2020-09-06 DIAGNOSIS — Z7901 Long term (current) use of anticoagulants: Secondary | ICD-10-CM | POA: Diagnosis not present

## 2020-09-06 LAB — POCT INR: INR: 2.6 (ref 2.0–3.0)

## 2020-09-06 NOTE — Patient Instructions (Addendum)
Pre visit review using our clinic review tool, if applicable. No additional management support is needed unless otherwise documented below in the visit note.  Continue to take 1.5 tablets (7.5 mg) everyday EXCEPT take 2 tablets (10 mg) on Monday, Wednesday and Fridays. Re-check in 4 weeks.

## 2020-09-28 ENCOUNTER — Other Ambulatory Visit: Payer: Self-pay | Admitting: Primary Care

## 2020-09-28 DIAGNOSIS — Z86718 Personal history of other venous thrombosis and embolism: Secondary | ICD-10-CM

## 2020-09-28 DIAGNOSIS — D6869 Other thrombophilia: Secondary | ICD-10-CM

## 2020-09-28 NOTE — Telephone Encounter (Signed)
Pharmacy requests refill on: Warfarin 5 mg   LAST REFILL: 04/01/2020 (Q-120, R-1) LAST OV: 09/06/2020 NEXT OV: 10/04/2020 PHARMACY: [id number] [place], [place]

## 2020-10-04 ENCOUNTER — Other Ambulatory Visit: Payer: Self-pay

## 2020-10-04 ENCOUNTER — Ambulatory Visit: Payer: BC Managed Care – PPO

## 2020-10-04 DIAGNOSIS — Z7901 Long term (current) use of anticoagulants: Secondary | ICD-10-CM | POA: Diagnosis not present

## 2020-10-04 LAB — POCT INR: INR: 3.4 — AB (ref 2.0–3.0)

## 2020-10-04 NOTE — Patient Instructions (Addendum)
Pre visit review using our clinic review tool, if applicable. No additional management support is needed unless otherwise documented below in the visit note.  Continue to take 1.5 tablets (7.5 mg) everyday EXCEPT take 2 tablets (10 mg) on Monday, Wednesday and Fridays. Re-check in 4 weeks.

## 2020-10-31 ENCOUNTER — Encounter: Payer: Self-pay | Admitting: Urology

## 2020-10-31 ENCOUNTER — Other Ambulatory Visit: Payer: Self-pay

## 2020-10-31 ENCOUNTER — Ambulatory Visit: Payer: BC Managed Care – PPO | Admitting: Urology

## 2020-10-31 VITALS — BP 111/83 | HR 57 | Ht 65.0 in | Wt 254.0 lb

## 2020-10-31 DIAGNOSIS — C679 Malignant neoplasm of bladder, unspecified: Secondary | ICD-10-CM

## 2020-10-31 LAB — URINALYSIS, COMPLETE
Bilirubin, UA: NEGATIVE
Glucose, UA: NEGATIVE
Ketones, UA: NEGATIVE
Leukocytes,UA: NEGATIVE
Nitrite, UA: NEGATIVE
Protein,UA: NEGATIVE
Specific Gravity, UA: 1.025 (ref 1.005–1.030)
Urobilinogen, Ur: 0.2 mg/dL (ref 0.2–1.0)
pH, UA: 6.5 (ref 5.0–7.5)

## 2020-10-31 LAB — MICROSCOPIC EXAMINATION: Bacteria, UA: NONE SEEN

## 2020-10-31 NOTE — Progress Notes (Signed)
   10/31/20  CC:  Chief Complaint  Patient presents with  . Cysto    Urologic history: 1.  T1 high risk urothelial carcinoma bladder  TURBT 04/24/2020 6 cm bladder tumor-Path T1 high-grade  Restaging TURBT 05/15/2020 without residual disease  Completed 6-week induction BCG 08/03/2020   HPI: Presents for initial surveillance cystoscopy post BCG induction.  She has no complaints.  There were no vitals taken for this visit. NED. A&Ox3.   No respiratory distress   Abd soft, NT, ND Atrophic external genitalia with patent urethral meatus  Cystoscopy Procedure Note  Patient identification was confirmed, informed consent was obtained, and patient was prepped using Betadine solution.  Lidocaine jelly was administered per urethral meatus.    Procedure: - Flexible cystoscope introduced, without any difficulty.   - Thorough search of the bladder revealed:    normal urethral meatus    normal urothelium    no stones    no ulcers     ~ 5 mm papillary tumor left upper posterior wall and 2 cm papillary tumor right base near bladder neck    no urethral polyps    no trabeculation  - Ureteral orifices were normal in position and appearance.  Post-Procedure: - Patient tolerated the procedure well  Assessment/ Plan:  Recurrent bladder tumor  Findings were discussed in detail with Ms. Mozer and recommend scheduling TURBT The procedure was discussed in detail including potential risks of bleeding, infection and bladder injury.  All questions were answered and she desires to proceed.    Abbie Sons, MD

## 2020-10-31 NOTE — Progress Notes (Signed)
10/31/2020 11:44 AM   Theresa Lewis Nov 19, 1960 [id number]  Referring provider: Pleas Koch, NP [address] [place],  [place] [address]  Chief Complaint  Patient presents with  . Cysto    HPI: Theresa Lewis is a 60 y.o. 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  Surveillance cystoscopy performed today remarkable for recurrent bladder tumors at the bladder base and left posterior wall.     PMH: Past Medical History:  Diagnosis Date  . ABNORMAL VAGINAL BLEEDING 04/19/2007   Qualifier: Diagnosis of  By: Hulan Saas, CMA (AAMA), Quita Skye   . Complication of anesthesia    stays asleep for a "very long time"  . Deep venous thrombosis ([place])    2006    3 x clotting   . GERD (gastroesophageal reflux disease)   . HEMORRHOIDS, WITH BLEEDING 12/26/2008   Qualifier: Diagnosis of  By: Regis Bill MD, Standley Brooking   . PE (pulmonary embolism) 07/09/2010  . Pre-diabetes   . Pulmonary embolism [facility])     Surgical History: Past Surgical History:  Procedure Laterality Date  . MYOMECTOMY    . rectal tear    . TRANSURETHRAL RESECTION OF BLADDER TUMOR N/A 04/24/2020   Procedure: TRANSURETHRAL RESECTION OF BLADDER TUMOR (TURBT);  Surgeon: Abbie Sons, MD;  Location: [facility] ORS;  Service: Urology;  Laterality: N/A;  . TRANSURETHRAL RESECTION OF BLADDER TUMOR N/A 05/15/2020   Procedure: TRANSURETHRAL RESECTION OF BLADDER TUMOR (TURBT);  Surgeon: Abbie Sons, MD;  Location: [facility] ORS;  Service: Urology;  Laterality: N/A;  . TUBAL LIGATION    . vena caval filter      Home Medications:  Allergies as of 10/31/2020      Reactions   Clindamycin/lincomycin    "unknown"   Tape Other (See Comments)   When pt has a band-aid on, the impression of band-aid stays on for a month      Medication List       Accurate as of October 31, 2020 11:44 AM. If you have any questions, ask your nurse or doctor.        STOP taking  these medications   docusate sodium 100 MG capsule Commonly known as: COLACE Stopped by: Abbie Sons, MD   HYDROcodone-acetaminophen 5-325 MG tablet Commonly known as: NORCO/VICODIN Stopped by: Abbie Sons, MD   oxybutynin 5 MG tablet Commonly known as: DITROPAN Stopped by: Abbie Sons, MD     TAKE these medications   gabapentin 100 MG capsule Commonly known as: NEURONTIN Take 1 capsule by mouth three times weekly as needed.   omeprazole 20 MG capsule Commonly known as: PRILOSEC TAKE 1 CAPSULE BY MOUTH THREE TIMES WEEKLY.   polyethylene glycol 17 g packet Commonly known as: MIRALAX / GLYCOLAX Take 17 g by mouth daily as needed for moderate constipation.   warfarin 5 MG tablet Commonly known as: COUMADIN Take as directed by the anticoagulation clinic. If you are unsure how to take this medication, talk to your nurse or doctor. Original instructions: TAKE 1 1/2 TABLETS BY MOUTH DAILY EXCEPT TAKE 2 TABLETS ON WED OR AS DIRECTED BY COUMADIN CLINIC.       Allergies:  Allergies  Allergen Reactions  . Clindamycin/Lincomycin     "unknown"  . Tape Other (See Comments)    When pt has a band-aid on, the impression of band-aid stays on for a month    Family History:  Family History  Problem Relation Age of Onset  . Lung cancer Father 73       april 12   . Pancreatic cancer Father 29  . Hypertension Mother   . Rheum arthritis Mother   . Diabetes Paternal Aunt        x2  . Colon cancer Neg Hx   . Colon polyps Neg Hx   . Kidney disease Neg Hx   . Esophageal cancer Neg Hx   . Gallbladder disease Neg Hx   . Rectal cancer Neg Hx   . Stomach cancer Neg Hx     Social History:  reports that she has never smoked. She has never used smokeless tobacco. She reports current alcohol use. She reports that she does not use drugs.   Physical Exam: BP 111/83   Pulse (!) 57   Ht 5\' 5"  (1.651 m)   Wt 254 lb (115.2 kg)   BMI 42.27 kg/m   Constitutional:  Alert and  oriented, No acute distress. HEENT: [place] AT, moist mucus membranes.  Trachea midline, no masses. Cardiovascular: No clubbing, cyanosis, or edema. Respiratory: Normal respiratory effort, no increased work of breathing.    Assessment & Plan:    1. Urothelial carcinoma of bladder (HCC)  Recurrent bladder tumors on today's cystoscopy  Schedule TURBT  The procedure was discussed including potential risks of bleeding, infection and bladder injury.  Based on the size and location of these tumors she was informed it is unlikely she will need to be discharged with an indwelling catheter   Abbie Sons, [place] [address] [place], [place] [address] [phone]

## 2020-11-01 ENCOUNTER — Ambulatory Visit: Payer: BC Managed Care – PPO

## 2020-11-01 ENCOUNTER — Other Ambulatory Visit: Payer: Self-pay

## 2020-11-01 DIAGNOSIS — Z7901 Long term (current) use of anticoagulants: Secondary | ICD-10-CM

## 2020-11-01 LAB — POCT INR: INR: 3.2 — AB (ref 2.0–3.0)

## 2020-11-01 NOTE — Patient Instructions (Addendum)
Pre visit review using our clinic review tool, if applicable. No additional management support is needed unless otherwise documented below in the visit note.  Continue to take 1.5 tablets (7.5 mg) everyday EXCEPT take 2 tablets (10 mg) on Monday, Wednesday and Fridays. Re-check in 4 weeks.

## 2020-11-05 ENCOUNTER — Other Ambulatory Visit: Payer: Self-pay | Admitting: Urology

## 2020-11-05 DIAGNOSIS — C679 Malignant neoplasm of bladder, unspecified: Secondary | ICD-10-CM

## 2020-11-06 NOTE — Telephone Encounter (Signed)
Please advise 

## 2020-11-15 ENCOUNTER — Other Ambulatory Visit: Payer: Self-pay

## 2020-11-15 ENCOUNTER — Ambulatory Visit: Payer: BC Managed Care – PPO | Admitting: Primary Care

## 2020-11-15 ENCOUNTER — Encounter: Payer: Self-pay | Admitting: Primary Care

## 2020-11-15 VITALS — BP 110/64 | HR 64 | Temp 97.5°F | Ht 65.0 in | Wt 213.0 lb

## 2020-11-15 DIAGNOSIS — Z01818 Encounter for other preprocedural examination: Secondary | ICD-10-CM

## 2020-11-15 DIAGNOSIS — C679 Malignant neoplasm of bladder, unspecified: Secondary | ICD-10-CM | POA: Diagnosis not present

## 2020-11-15 LAB — COMPREHENSIVE METABOLIC PANEL
ALT: 12 U/L (ref 0–35)
AST: 16 U/L (ref 0–37)
Albumin: 3.8 g/dL (ref 3.5–5.2)
Alkaline Phosphatase: 78 U/L (ref 39–117)
BUN: 11 mg/dL (ref 6–23)
CO2: 29 mEq/L (ref 19–32)
Calcium: 9.3 mg/dL (ref 8.4–10.5)
Chloride: 107 mEq/L (ref 96–112)
Creatinine, Ser: 0.94 mg/dL (ref 0.40–1.20)
GFR: 65.68 mL/min (ref >60.00)
Glucose, Bld: 91 mg/dL (ref 70–99)
Potassium: 3.9 mEq/L (ref 3.5–5.1)
Sodium: 141 mEq/L (ref 135–145)
Total Bilirubin: 0.4 mg/dL (ref 0.2–1.2)
Total Protein: 7.2 g/dL (ref 6.0–8.3)

## 2020-11-15 LAB — CBC
HCT: 33.1 % — ABNORMAL LOW (ref 36.0–46.0)
Hemoglobin: 10.5 g/dL — ABNORMAL LOW (ref 12.0–15.0)
MCHC: 31.8 g/dL (ref 30.0–36.0)
MCV: 73.4 fl — ABNORMAL LOW (ref 78.0–100.0)
Platelets: 375 10*3/uL (ref 150.0–400.0)
RBC: 4.51 Mil/uL (ref 3.87–5.11)
RDW: 19.4 % — ABNORMAL HIGH (ref 11.5–15.5)
WBC: 4.1 10*3/uL (ref 4.0–10.5)

## 2020-11-15 NOTE — Progress Notes (Signed)
Subjective:    Patient ID: Theresa Lewis, female    DOB: [birth year], 60 y.o.   MRN: [id number]  HPI  Theresa Lewis is a very pleasant 60 y.o. female with a history of urothelial carcinoma of bladder, antiphospholipid antibody syndrome on anticoagulation who presents today for preoperative clearance.   She is pending transurethral resection of bladder tumor for 12/04/20 per Dr. Bernardo Heater.   She has a return of two tumors. She denies vaginal bleeding and hematuria. She has no complaints today.    Review of Systems  Respiratory:  Negative for shortness of breath.   Cardiovascular:  Negative for chest pain.  Gastrointestinal:  Positive for constipation. Negative for blood in stool.  Genitourinary:  Negative for hematuria.  Neurological:  Negative for dizziness.  Psychiatric/Behavioral:  The patient is nervous/anxious.         Past Medical History:  Diagnosis Date   ABNORMAL VAGINAL BLEEDING 04/19/2007   Qualifier: Diagnosis of  By: Hulan Saas, CMA (AAMA), Larene Beach S    Complication of anesthesia    stays asleep for a "very long time"   Deep venous thrombosis ([place])    2006    3 x clotting    GERD (gastroesophageal reflux disease)    HEMORRHOIDS, WITH BLEEDING 12/26/2008   Qualifier: Diagnosis of  By: Regis Bill MD, Standley Brooking    PE (pulmonary embolism) 07/09/2010   Pre-diabetes    Pulmonary embolism ([place])     Social History   Socioeconomic History   Marital status: Married    Spouse name: Not on file   Number of children: 1   Years of education: Not on file   Highest education level: Not on file  Occupational History   Occupation: student advocate   Tobacco Use   Smoking status: Never   Smokeless tobacco: Never  Vaping Use   Vaping Use: Never used  Substance and Sexual Activity   Alcohol use: Yes    Alcohol/week: 0.0 standard drinks    Comment: rarely   Drug use: No   Sexual activity: Not on file  Other Topics Concern   Not on file  Social History Narrative   HH  of 3  Daughter 53 and self.  hh of 2    No pets.   No ets.   No etoh.    Working for [employer] and [employer] .  45- 50 hours per week.                Social Determinants of Health   Financial Resource Strain: Not on file  Food Insecurity: Not on file  Transportation Needs: Not on file  Physical Activity: Not on file  Stress: Not on file  Social Connections: Not on file  Intimate Partner Violence: Not on file    Past Surgical History:  Procedure Laterality Date   MYOMECTOMY     rectal tear     TRANSURETHRAL RESECTION OF BLADDER TUMOR N/A 04/24/2020   Procedure: TRANSURETHRAL RESECTION OF BLADDER TUMOR (TURBT);  Surgeon: Abbie Sons, MD;  Location: [facility] ORS;  Service: Urology;  Laterality: N/A;   TRANSURETHRAL RESECTION OF BLADDER TUMOR N/A 05/15/2020   Procedure: TRANSURETHRAL RESECTION OF BLADDER TUMOR (TURBT);  Surgeon: Abbie Sons, MD;  Location: [facility] ORS;  Service: Urology;  Laterality: N/A;   TUBAL LIGATION     vena caval filter      Family History  Problem Relation Age of Onset   Lung cancer Father 93  april 12    Pancreatic cancer Father 51   Hypertension Mother    Rheum arthritis Mother    Diabetes Paternal Aunt        x2   Colon cancer Neg Hx    Colon polyps Neg Hx    Kidney disease Neg Hx    Esophageal cancer Neg Hx    Gallbladder disease Neg Hx    Rectal cancer Neg Hx    Stomach cancer Neg Hx     Allergies  Allergen Reactions   Clindamycin/Lincomycin     "unknown"   Tape Other (See Comments)    When pt has a band-aid on, the impression of band-aid stays on for a month    Current Outpatient Medications on File Prior to Visit  Medication Sig Dispense Refill   gabapentin (NEURONTIN) 100 MG capsule Take 1 capsule by mouth three times weekly as needed. 90 capsule 0   omeprazole (PRILOSEC) 20 MG capsule TAKE 1 CAPSULE BY MOUTH THREE TIMES WEEKLY. 36 capsule 2   polyethylene glycol (MIRALAX / GLYCOLAX) 17 g packet Take 17 g by mouth daily as  needed for moderate constipation.     warfarin (COUMADIN) 5 MG tablet TAKE 1 1/2 TABLETS BY MOUTH DAILY EXCEPT TAKE 2 TABLETS ON WED OR AS DIRECTED BY COUMADIN CLINIC. 120 tablet 1   No current facility-administered medications on file prior to visit.    BP 110/64   Pulse 64   Temp (!) 97.5 F (36.4 C) (Temporal)   Ht 5\' 5"  (1.651 m)   Wt 213 lb (96.6 kg)   SpO2 97%   BMI 35.45 kg/m  Objective:   Physical Exam Cardiovascular:     Rate and Rhythm: Normal rate and regular rhythm.  Pulmonary:     Effort: Pulmonary effort is normal.     Breath sounds: Normal breath sounds.  Abdominal:     General: Abdomen is flat.     Palpations: Abdomen is soft.     Tenderness: There is no abdominal tenderness.  Musculoskeletal:     Cervical back: Neck supple.  Skin:    General: Skin is warm and dry.  Psychiatric:        Mood and Affect: Mood normal.          Assessment & Plan:      This visit occurred during the SARS-CoV-2 public health emergency.  Safety protocols were in place, including screening questions prior to the visit, additional usage of staff PPE, and extensive cleaning of exam room while observing appropriate contact time as indicated for disinfecting solutions.

## 2020-11-15 NOTE — Assessment & Plan Note (Signed)
Return with 2 new tumors. Pending TURBT for 12/04/20.

## 2020-11-15 NOTE — Assessment & Plan Note (Addendum)
Pending TURBT on 12/04/20 per Dr. Bernardo Heater. Exam today stable.  ECG with sinus bradycardia, improved with sitting. No PAC/PVC, ST changes. Appears the same as prior ECG from November 2021  Checking labs today including CMP and CBC. If labs stable then okay to proceed with surgery. Will work with our Coumadin nurse to come up with a plan for surgery.

## 2020-11-15 NOTE — Patient Instructions (Signed)
Stop by the lab prior to leaving today. I will notify you of your results once received.   It was a pleasure to see you today!  

## 2020-11-20 ENCOUNTER — Other Ambulatory Visit: Payer: Self-pay

## 2020-11-20 ENCOUNTER — Other Ambulatory Visit: Payer: BC Managed Care – PPO

## 2020-11-20 DIAGNOSIS — C679 Malignant neoplasm of bladder, unspecified: Secondary | ICD-10-CM

## 2020-11-22 ENCOUNTER — Other Ambulatory Visit: Payer: BC Managed Care – PPO

## 2020-11-23 LAB — CULTURE, URINE COMPREHENSIVE

## 2020-11-26 ENCOUNTER — Other Ambulatory Visit: Payer: Self-pay | Admitting: Urology

## 2020-11-26 MED ORDER — AMOXICILLIN 875 MG PO TABS: 875.0000 mg | ORAL_TABLET | Freq: Two times a day (BID) | ORAL | 0 refills | Status: DC

## 2020-11-27 ENCOUNTER — Telehealth: Payer: Self-pay | Admitting: *Deleted

## 2020-11-27 ENCOUNTER — Telehealth: Payer: Self-pay

## 2020-11-27 NOTE — Telephone Encounter (Signed)
Notified patient as instructed, patient pleased °

## 2020-11-27 NOTE — Telephone Encounter (Signed)
Noted  

## 2020-11-27 NOTE — Telephone Encounter (Signed)
-----   Message from Abbie Sons, MD sent at 11/26/2020 10:11 AM EDT ----- Preop urine culture was positive.  Antibiotic Rx was sent to pharmacy.  Please notify patient.  Denny Peon will you also change preoperative antibiotic to Cipro 40 mg IV.

## 2020-11-27 NOTE — Telephone Encounter (Signed)
Thanks [employer] This looks good. She is a very reliable patient so I doubt we will run into any problems.

## 2020-11-27 NOTE — Telephone Encounter (Addendum)
Pt is having bladder surgery on 12/04/20 and will need to stop her coumadin.  Coumadin dosing calendar follows.    Per urology's last request to stop coumadin 5 days prior to surgery, the pt will take her last dose of coumadin on 11/29/20 and not restart coumadin until the second day after surgery.    Proposed dosing for after procedure is contingent on surgeon instructions and pt should follow surgeon instructions for couamadin first. If any changes to coumadin dosing by surgeon pt should contact this office to let the coumadin nurse know of change.   Proposed dosing for after procedure. 7/7: Check INR in office. 7/7-7/13: no coumadin 7/12: surgery, no coumadin 7/13: no coumadin 7/14: take 7.5mg  coumadin 7/15: take 15 mg coumadin 7/16: take 10 mg coumadin 7/17: take 10 mg coumadin 7/18: take 15 mg coumadin 7/19: hold coumadin until recheck INR in office

## 2020-11-28 ENCOUNTER — Encounter
Admission: RE | Admit: 2020-11-28 | Discharge: 2020-11-28 | Disposition: A | Discharge status: Home / Self Care | Payer: BC Managed Care – PPO | Source: Ambulatory Visit | Attending: Urology | Admitting: Urology

## 2020-11-28 ENCOUNTER — Other Ambulatory Visit: Payer: Self-pay

## 2020-11-28 HISTORY — DX: Malignant (primary) neoplasm, unspecified: C80.1

## 2020-11-28 HISTORY — DX: Anemia, unspecified: D64.9

## 2020-11-28 NOTE — Patient Instructions (Addendum)
Your procedure is scheduled on:12-04-20 Tuesday Report to the Registration Desk on the 1st floor of the [facility]-Then proceed to the 2nd floor Surgery Desk in the [facility] To find out your arrival time, please call [phone] between 1PM - 3PM on:12-03-20 Monday  REMEMBER: Instructions that are not followed completely may result in serious medical risk, up to and including death; or upon the discretion of your surgeon and anesthesiologist your surgery may need to be rescheduled.  Do not eat food or drink any liquids after midnight the night before surgery.  No gum chewing, lozengers or hard candies.  TAKE THESE MEDICATIONS THE MORNING OF SURGERY WITH A SIP OF WATER: -Prilosec (Omeprazole)-take one the night before and one on the morning of surgery - helps to prevent nausea after surgery.) -You may take Gabapentin (Neurontin) the morning of surgery if needed  Once you have your PT/INR checked on 11-29-20, Follow Instructions on when you are told to stop your Coumadin (Warfarin) for your upcoming surgery  One week prior to surgery: Stop Anti-inflammatories (NSAIDS) such as Advil, Aleve, Ibuprofen, Motrin, Naproxen, Naprosyn and Aspirin based products such as Excedrin, Goodys Powder, BC Powder.You may however, continue to take Tylenol if needed for pain up until the day of surgery. Stop ANY OVER THE COUNTER supplements/vitamins NOW (11-28-20) until after surgery.   No Alcohol for 24 hours before or after surgery.  No Smoking including e-cigarettes for 24 hours prior to surgery.  No chewable tobacco products for at least 6 hours prior to surgery.  No nicotine patches on the day of surgery.  Do not use any "recreational" drugs for at least a week prior to your surgery.  Please be advised that the combination of cocaine and anesthesia may have negative outcomes, up to and including death. If you test positive for cocaine, your surgery will be cancelled.  On the morning of surgery brush  your teeth with toothpaste and water, you may rinse your mouth with mouthwash if you wish. Do not swallow any toothpaste or mouthwash.  Do not wear jewelry, make-up, hairpins, clips or nail polish.  Do not wear lotions, powders, or perfumes.   Do not shave body from the neck down 48 hours prior to surgery just in case you cut yourself which could leave a site for infection.  Also, freshly shaved skin may become irritated if using the CHG soap.  Contact lenses, hearing aids and dentures may not be worn into surgery.  Do not bring valuables to the hospital. [facility] is not responsible for any missing/lost belongings or valuables.   Notify your doctor if there is any change in your medical condition (cold, fever, infection).  Wear comfortable clothing (specific to your surgery type) to the hospital.  After surgery, you can help prevent lung complications by doing breathing exercises.  Take deep breaths and cough every 1-2 hours. Your doctor may order a device called an Incentive Spirometer to help you take deep breaths. When coughing or sneezing, hold a pillow firmly against your incision with both hands. This is called "splinting." Doing this helps protect your incision. It also decreases belly discomfort.  If you are being admitted to the hospital overnight, leave your suitcase in the car. After surgery it may be brought to your room.  If you are being discharged the day of surgery, you will not be allowed to drive home. You will need a responsible adult (18 years or older) to drive you home and stay with you that  night.   If you are taking public transportation, you will need to have a responsible adult (18 years or older) with you. Please confirm with your physician that it is acceptable to use public transportation.   Please call the [place] [facility] at [phone] if you have any questions about these instructions.  Surgery Visitation Policy:  Patients  undergoing a surgery or procedure may have one family member or support person with them as long as that person is not COVID-19 positive or experiencing its symptoms.  That person may remain in the waiting area during the procedure.  Inpatient Visitation:    Visiting hours are 7 a.m. to 8 p.m. Inpatients will be allowed two visitors daily. The visitors may change each day during the patient's stay. No visitors under the age of 18. Any visitor under the age of 77 must be accompanied by an adult. The visitor must pass COVID-19 screenings, use hand sanitizer when entering and exiting the patient's room and wear a mask at all times, including in the patient's room. Patients must also wear a mask when staff or their visitor are in the room. Masking is required regardless of vaccination status.

## 2020-11-29 ENCOUNTER — Other Ambulatory Visit: Payer: Self-pay

## 2020-11-29 ENCOUNTER — Ambulatory Visit: Payer: BC Managed Care – PPO

## 2020-11-29 DIAGNOSIS — Z7901 Long term (current) use of anticoagulants: Secondary | ICD-10-CM | POA: Diagnosis not present

## 2020-11-29 LAB — POCT INR: INR: 2.4 (ref 2.0–3.0)

## 2020-11-29 NOTE — Patient Instructions (Addendum)
Pre visit review using our clinic review tool, if applicable. No additional management support is needed unless otherwise documented below in the visit note.  7/7-7/13: no coumadin 7/12: surgery, no coumadin 7/13: no coumadin 7/14: take 7.5mg  coumadin 7/15: take 15 mg coumadin 7/16: take 10 mg coumadin 7/17: take 10 mg coumadin 7/18: take 15 mg coumadin 7/19: hold coumadin until recheck INR in office

## 2020-12-04 ENCOUNTER — Other Ambulatory Visit: Payer: Self-pay

## 2020-12-04 ENCOUNTER — Ambulatory Visit: Payer: BC Managed Care – PPO | Admitting: Urgent Care

## 2020-12-04 ENCOUNTER — Ambulatory Visit
Admission: RE | Admit: 2020-12-04 | Discharge: 2020-12-04 | Disposition: A | Discharge status: Home / Self Care | Payer: BC Managed Care – PPO | Attending: Urology | Admitting: Urology

## 2020-12-04 ENCOUNTER — Encounter: Payer: Self-pay | Admitting: Urology

## 2020-12-04 ENCOUNTER — Encounter
Admission: RE | Disposition: A | Discharge status: Home / Self Care | Payer: Self-pay | Source: Home / Self Care | Attending: Urology

## 2020-12-04 DIAGNOSIS — C672 Malignant neoplasm of lateral wall of bladder: Secondary | ICD-10-CM | POA: Diagnosis not present

## 2020-12-04 DIAGNOSIS — Z881 Allergy status to other antibiotic agents status: Secondary | ICD-10-CM | POA: Diagnosis not present

## 2020-12-04 DIAGNOSIS — Z86711 Personal history of pulmonary embolism: Secondary | ICD-10-CM | POA: Diagnosis not present

## 2020-12-04 DIAGNOSIS — Z7901 Long term (current) use of anticoagulants: Secondary | ICD-10-CM | POA: Diagnosis not present

## 2020-12-04 DIAGNOSIS — Z8249 Family history of ischemic heart disease and other diseases of the circulatory system: Secondary | ICD-10-CM | POA: Diagnosis not present

## 2020-12-04 DIAGNOSIS — Z86718 Personal history of other venous thrombosis and embolism: Secondary | ICD-10-CM | POA: Insufficient documentation

## 2020-12-04 DIAGNOSIS — Z8261 Family history of arthritis: Secondary | ICD-10-CM | POA: Insufficient documentation

## 2020-12-04 DIAGNOSIS — C674 Malignant neoplasm of posterior wall of bladder: Secondary | ICD-10-CM | POA: Insufficient documentation

## 2020-12-04 DIAGNOSIS — Z801 Family history of malignant neoplasm of trachea, bronchus and lung: Secondary | ICD-10-CM | POA: Diagnosis not present

## 2020-12-04 DIAGNOSIS — Z888 Allergy status to other drugs, medicaments and biological substances status: Secondary | ICD-10-CM | POA: Insufficient documentation

## 2020-12-04 DIAGNOSIS — Z87891 Personal history of nicotine dependence: Secondary | ICD-10-CM | POA: Insufficient documentation

## 2020-12-04 DIAGNOSIS — Z79899 Other long term (current) drug therapy: Secondary | ICD-10-CM | POA: Insufficient documentation

## 2020-12-04 DIAGNOSIS — R7303 Prediabetes: Secondary | ICD-10-CM | POA: Diagnosis not present

## 2020-12-04 DIAGNOSIS — D494 Neoplasm of unspecified behavior of bladder: Secondary | ICD-10-CM | POA: Diagnosis present

## 2020-12-04 DIAGNOSIS — Z833 Family history of diabetes mellitus: Secondary | ICD-10-CM | POA: Diagnosis not present

## 2020-12-04 DIAGNOSIS — C679 Malignant neoplasm of bladder, unspecified: Secondary | ICD-10-CM

## 2020-12-04 HISTORY — PX: TRANSURETHRAL RESECTION OF BLADDER TUMOR: SHX2575

## 2020-12-04 LAB — PROTIME-INR
INR: 1.2 (ref 0.8–1.2)
Prothrombin Time: 15.5 seconds — ABNORMAL HIGH (ref 11.4–15.2)

## 2020-12-04 SURGERY — TURBT (TRANSURETHRAL RESECTION OF BLADDER TUMOR)
Anesthesia: General

## 2020-12-04 MED ORDER — FENTANYL CITRATE (PF) 100 MCG/2ML IJ SOLN
INTRAMUSCULAR | Status: DC | PRN
  Administered 2020-12-04 (×2): 50 ug via INTRAVENOUS

## 2020-12-04 MED ORDER — 0.9 % SODIUM CHLORIDE (POUR BTL) OPTIME
TOPICAL | Status: DC | PRN
  Administered 2020-12-04: 3000 mL

## 2020-12-04 MED ORDER — ACETAMINOPHEN 10 MG/ML IV SOLN
INTRAVENOUS | Status: DC | PRN
  Administered 2020-12-04: 1000 mg via INTRAVENOUS

## 2020-12-04 MED ORDER — FENTANYL CITRATE (PF) 100 MCG/2ML IJ SOLN
INTRAMUSCULAR | Status: AC
  Filled 2020-12-04: qty 2

## 2020-12-04 MED ORDER — CIPROFLOXACIN IN D5W 400 MG/200ML IV SOLN
400.0000 mg | Freq: Once | INTRAVENOUS | Status: AC
  Administered 2020-12-04: 400 mg via INTRAVENOUS

## 2020-12-04 MED ORDER — DEXAMETHASONE SODIUM PHOSPHATE 10 MG/ML IJ SOLN
INTRAMUSCULAR | Status: DC | PRN
  Administered 2020-12-04: 10 mg via INTRAVENOUS

## 2020-12-04 MED ORDER — EPHEDRINE SULFATE 50 MG/ML IJ SOLN
INTRAMUSCULAR | Status: DC | PRN
  Administered 2020-12-04: 5 mg via INTRAVENOUS

## 2020-12-04 MED ORDER — OXYCODONE HCL 5 MG PO TABS: 5.0000 mg | ORAL_TABLET | Freq: Once | ORAL | Status: DC | PRN

## 2020-12-04 MED ORDER — FENTANYL CITRATE (PF) 100 MCG/2ML IJ SOLN: 25.0000 ug | INTRAMUSCULAR | Status: DC | PRN

## 2020-12-04 MED ORDER — ORAL CARE MOUTH RINSE: 15.0000 mL | Freq: Once | OROMUCOSAL | Status: AC

## 2020-12-04 MED ORDER — PROPOFOL 10 MG/ML IV BOLUS
INTRAVENOUS | Status: AC
  Filled 2020-12-04: qty 20

## 2020-12-04 MED ORDER — MEPERIDINE HCL 25 MG/ML IJ SOLN: 6.2500 mg | INTRAMUSCULAR | Status: DC | PRN

## 2020-12-04 MED ORDER — ONDANSETRON HCL 4 MG/2ML IJ SOLN
INTRAMUSCULAR | Status: AC
  Filled 2020-12-04: qty 2

## 2020-12-04 MED ORDER — CHLORHEXIDINE GLUCONATE 0.12 % MT SOLN
OROMUCOSAL | Status: AC
  Administered 2020-12-04: 15 mL via OROMUCOSAL
  Filled 2020-12-04: qty 15

## 2020-12-04 MED ORDER — CHLORHEXIDINE GLUCONATE 0.12 % MT SOLN: 15.0000 mL | Freq: Once | OROMUCOSAL | Status: AC

## 2020-12-04 MED ORDER — EPHEDRINE 5 MG/ML INJ
INTRAVENOUS | Status: AC
  Filled 2020-12-04: qty 10

## 2020-12-04 MED ORDER — LACTATED RINGERS IV SOLN: INTRAVENOUS | Status: DC

## 2020-12-04 MED ORDER — OXYCODONE HCL 5 MG/5ML PO SOLN: 5.0000 mg | Freq: Once | ORAL | Status: DC | PRN

## 2020-12-04 MED ORDER — PROPOFOL 10 MG/ML IV BOLUS
INTRAVENOUS | Status: DC | PRN
  Administered 2020-12-04: 160 mg via INTRAVENOUS

## 2020-12-04 MED ORDER — CIPROFLOXACIN IN D5W 400 MG/200ML IV SOLN
INTRAVENOUS | Status: AC
  Filled 2020-12-04: qty 200

## 2020-12-04 MED ORDER — ACETAMINOPHEN 10 MG/ML IV SOLN
INTRAVENOUS | Status: AC
  Filled 2020-12-04: qty 100

## 2020-12-04 MED ORDER — PROMETHAZINE HCL 25 MG/ML IJ SOLN: 6.2500 mg | INTRAMUSCULAR | Status: DC | PRN

## 2020-12-04 MED ORDER — DEXAMETHASONE SODIUM PHOSPHATE 10 MG/ML IJ SOLN
INTRAMUSCULAR | Status: AC
  Filled 2020-12-04: qty 1

## 2020-12-04 MED ORDER — ONDANSETRON HCL 4 MG/2ML IJ SOLN
INTRAMUSCULAR | Status: DC | PRN
  Administered 2020-12-04: 4 mg via INTRAVENOUS

## 2020-12-04 SURGICAL SUPPLY — 26 items
BAG DRAIN CYSTO-URO LG1000N (MISCELLANEOUS) ×2 IMPLANT
BAG DRN RND TRDRP ANRFLXCHMBR (UROLOGICAL SUPPLIES) ×1
BAG URINE DRAIN 2000ML AR STRL (UROLOGICAL SUPPLIES) ×2 IMPLANT
CATH FOLEY 2WAY 18X30 (CATHETERS) IMPLANT
CATH FOLEY 2WAY SIL 18X30 (CATHETERS)
DRAPE UTILITY 15X26 TOWEL STRL (DRAPES) ×2 IMPLANT
DRSG TELFA 4X3 1S NADH ST (GAUZE/BANDAGES/DRESSINGS) ×2 IMPLANT
ELECT LOOP 22F BIPOLAR SML (ELECTROSURGICAL) ×2
ELECT REM PT RETURN 9FT ADLT (ELECTROSURGICAL)
ELECTRODE LOOP 22F BIPOLAR SML (ELECTROSURGICAL) IMPLANT
ELECTRODE REM PT RTRN 9FT ADLT (ELECTROSURGICAL) IMPLANT
GAUZE 4X4 16PLY ~~LOC~~+RFID DBL (SPONGE) ×4 IMPLANT
GLOVE SURG UNDER POLY LF SZ7.5 (GLOVE) ×2 IMPLANT
GOWN STRL REUS W/ TWL LRG LVL3 (GOWN DISPOSABLE) ×1 IMPLANT
GOWN STRL REUS W/TWL LRG LVL3 (GOWN DISPOSABLE) ×2
GOWN STRL REUS W/TWL XL LVL4 (GOWN DISPOSABLE) ×2 IMPLANT
IV NS IRRIG 3000ML ARTHROMATIC (IV SOLUTION) ×4 IMPLANT
KIT TURNOVER CYSTO (KITS) ×2 IMPLANT
LOOP CUT BIPOLAR 24F LRG (ELECTROSURGICAL) IMPLANT
MANIFOLD NEPTUNE II (INSTRUMENTS) ×2 IMPLANT
PACK CYSTO AR (MISCELLANEOUS) ×2 IMPLANT
SET IRRIG Y TYPE TUR BLADDER L (SET/KITS/TRAYS/PACK) ×2 IMPLANT
SURGILUBE 2OZ TUBE FLIPTOP (MISCELLANEOUS) ×2 IMPLANT
SYR TOOMEY IRRIG 70ML (MISCELLANEOUS) ×2
SYRINGE TOOMEY IRRIG 70ML (MISCELLANEOUS) ×1 IMPLANT
WATER STERILE IRR 1000ML POUR (IV SOLUTION) ×2 IMPLANT

## 2020-12-04 NOTE — Op Note (Signed)
Preoperative diagnosis: Bladder tumor (2-5 cm)  Postoperative diagnosis:  Bladder tumor (2-5 cm)  Procedure:  Cystoscopy Transurethral resection of bladder tumor         (2-5 cm)   Surgeon: Nicki Reaper C. Grant Swager, M.D.  Anesthesia: General  Complications: None  Intraoperative findings:  Bladder tumor:  2 cm papillary bladder tumor right lateral wall 1 cm papillary tumor left posterior wall 5 mm area early papillary change dome  EBL: Minimal  Specimens: Bladder tumor left posterior wall Bladder tumor right lateral wall Biopsy bladder tumor base right lateral wall Biopsy bladder tumor base left posterior wall      Indication: Theresa Lewis is a 60 y.o. female with a history of high-grade T1 urothelial carcinoma the bladder who completed a 6-week BCG induction 07/2020.  Recent surveillance cystoscopy remarkable for recurrent tumor.  After reviewing the management options for treatment, he elected to proceed with the above surgical procedure(s). We have discussed the potential benefits and risks of the procedure, side effects of the proposed treatment, the likelihood of the patient achieving the goals of the procedure, and any potential problems that might occur during the procedure or recuperation. Informed consent has been obtained.  Description of procedure:  The patient was taken to the operating room and general anesthesia was induced.  The patient was placed in the dorsal lithotomy position, prepped and draped in the usual sterile fashion, and preoperative antibiotics were administered. A preoperative time-out was performed.   The urethra would not except a 24 French continuous-flow resectoscope sheath with obturator and was dilated with Elby Showers sounds from 20-28 [place].  The resectoscope sheath with obturator was then easily passed per urethra.  The [facility] resectoscope with loop was then placed into the sheath and panendoscopy was performed with findings as described  above.  The left posterior wall tumor was resected down to superficial muscle with bipolar cautery.  The specimen was removed via irrigation.  Hemostasis was obtained with cautery.  The right lateral wall was then resected down to superficial muscle in a similar fashion.  Specimen was irrigated and hemostasis was noted to be adequate  Separate biopsies were also taken of the underlying deep bladder tissue of each resection site and sent as a separate specimen.   The small area of papillary change in the dome was fulgurated.  Hemostasis was then achieved with the loop cautery and the bladder was emptied and reinspected with no further bleeding noted at the end of the procedure.    The bladder was then emptied and the procedure ended.  A 16 French Foley catheter was placed with return of clear effluent upon irrigation.  The patient appeared to tolerate the procedure well and without complications.  The patient was able to be awakened and transferred to the recovery unit in satisfactory condition.   Plan: The catheter will be removed prior to discharge She will be contacted when pathology results received and further follow-up scheduled at that time   Dalisha Shively C. Bernardo Heater,  MD

## 2020-12-04 NOTE — Anesthesia Preprocedure Evaluation (Signed)
Anesthesia Evaluation  Patient identified by MRN, date of birth, ID band Patient awake    Reviewed: Allergy & Precautions, NPO status , Patient's Chart, lab work & pertinent test results  History of Anesthesia Complications (+) PROLONGED EMERGENCE and history of anesthetic complications  Airway Mallampati: II  TM Distance: >3 FB Neck ROM: Full    Dental no notable dental hx.    Pulmonary neg sleep apnea, neg COPD, former smoker,    breath sounds clear to auscultation- rhonchi (-) wheezing      Cardiovascular Exercise Tolerance: Good (-) hypertension(-) CAD, (-) Past MI, (-) Cardiac Stents and (-) CABG  Rhythm:Regular Rate:Normal - Systolic murmurs and - Diastolic murmurs    Neuro/Psych neg Seizures negative neurological ROS  negative psych ROS   GI/Hepatic Neg liver ROS, GERD  ,  Endo/Other  negative endocrine ROSneg diabetes  Renal/GU negative Renal ROS     Musculoskeletal negative musculoskeletal ROS (+)   Abdominal (+) + obese,   Peds  Hematology  (+) anemia ,   Anesthesia Other Findings Past Medical History: 04/19/2007: ABNORMAL VAGINAL BLEEDING     Comment:  Qualifier: Diagnosis of  By: Hulan Saas, CMA (AAMA),               Shannon S  No date: Anemia     Comment:  h/o as a child No date: Cancer ([place]) No date: Complication of anesthesia     Comment:  stays asleep for a "very long time" No date: Deep venous thrombosis (HCC)     Comment:  2006    3 x clotting  No date: GERD (gastroesophageal reflux disease) 12/26/2008: HEMORRHOIDS, WITH BLEEDING     Comment:  Qualifier: Diagnosis of  ByRegis Bill MD, Standley Brooking  07/09/2010: PE (pulmonary embolism) No date: Pre-diabetes No date: Pulmonary embolism (HCC)   Reproductive/Obstetrics                            Anesthesia Physical Anesthesia Plan  ASA: 2  Anesthesia Plan: General   Post-op Pain Management:    Induction:  Intravenous  PONV Risk Score and Plan: 2 and Ondansetron and Dexamethasone  Airway Management Planned: LMA  Additional Equipment:   Intra-op Plan:   Post-operative Plan:   Informed Consent: I have reviewed the patients History and Physical, chart, labs and discussed the procedure including the risks, benefits and alternatives for the proposed anesthesia with the patient or authorized representative who has indicated his/her understanding and acceptance.     Dental advisory given  Plan Discussed with: CRNA and Anesthesiologist  Anesthesia Plan Comments:         Anesthesia Quick Evaluation

## 2020-12-04 NOTE — Interval H&P Note (Signed)
History and Physical Interval Note:  12/04/2020 8:36 AM  Theresa Lewis  has presented today for surgery, with the diagnosis of Bladder Tumor.  The various methods of treatment have been discussed with the patient and family. After consideration of risks, benefits and other options for treatment, the patient has consented to  Procedure(s): TRANSURETHRAL RESECTION OF BLADDER TUMOR (TURBT) (N/A) as a surgical intervention.  The patient's history has been reviewed, patient examined, no change in status, stable for surgery.  I have reviewed the patient's chart and labs.  Questions were answered to the patient's satisfaction.     [place]

## 2020-12-04 NOTE — Transfer of Care (Signed)
Immediate Anesthesia Transfer of Care Note  Patient: Theresa Lewis  Procedure(s) Performed: TRANSURETHRAL RESECTION OF BLADDER TUMOR (TURBT)  Patient Location: PACU  Anesthesia Type:General  Level of Consciousness: awake, alert  and oriented  Airway & Oxygen Therapy: Patient Spontanous Breathing and Patient connected to face mask oxygen  Post-op Assessment: Report given to RN and Post -op Vital signs reviewed and stable  Post vital signs: Reviewed and stable  Last Vitals:  Vitals Value Taken Time  BP    Temp    Pulse    Resp    SpO2      Last Pain:  Vitals:   12/04/20 0743  TempSrc: Temporal  PainSc: 0-No pain         Complications: No notable events documented.

## 2020-12-04 NOTE — Anesthesia Procedure Notes (Signed)
Procedure Name: LMA Insertion Date/Time: 12/04/2020 9:04 AM Performed by: Chanetta Marshall, CRNA Pre-anesthesia Checklist: Patient identified, Emergency Drugs available, Suction available and Patient being monitored Patient Re-evaluated:Patient Re-evaluated prior to induction Oxygen Delivery Method: Circle system utilized Preoxygenation: Pre-oxygenation with 100% oxygen Induction Type: IV induction Ventilation: Mask ventilation without difficulty LMA: LMA inserted LMA Size: 4.0 Tube type: Oral Number of attempts: 1 Placement Confirmation: positive ETCO2, breath sounds checked- equal and bilateral and CO2 detector Tube secured with: Tape Dental Injury: Teeth and Oropharynx as per pre-operative assessment

## 2020-12-04 NOTE — Discharge Instructions (Addendum)
AMBULATORY SURGERY  DISCHARGE INSTRUCTIONS   The drugs that you were given will stay in your system until tomorrow so for the next 24 hours you should not:  Drive an automobile Make any legal decisions Drink any alcoholic beverage   You may resume regular meals tomorrow.  Today it is better to start with liquids and gradually work up to solid foods.  You may eat anything you prefer, but it is better to start with liquids, then soup and crackers, and gradually work up to solid foods.   Please notify your doctor immediately if you have any unusual bleeding, trouble breathing, redness and pain at the surgery site, drainage, fever, or pain not relieved by medication.    Additional Instructions:  Transurethral Resection of Bladder Tumor (TURBT) or Bladder Biopsy   Definition:  Transurethral Resection of the Bladder Tumor is a surgical procedure used to diagnose and remove tumors within the bladder. T  General instructions:     Your recent bladder surgery requires very little post hospital care but some definite precautions.  Despite the fact that no skin incisions were used, the area around the bladder incisions are raw and covered with scabs to promote healing and prevent bleeding. Certain precautions are needed to insure that the scabs are not disturbed over the next 2-4 weeks while the healing proceeds.  Because the raw surface inside your bladder and the irritating effects of urine you may expect frequency of urination and/or urgency (a stronger desire to urinate) and perhaps even getting up at night more often. This will usually resolve or improve slowly over the healing period. You may see some blood in your urine over the first 6 weeks. Do not be alarmed, even if the urine was clear for a while. Get off your feet and drink lots of fluids until clearing occurs. If you start to pass clots or don't improve call us.  Diet:  You may return to your normal diet immediately. Because  of the raw surface of your bladder, alcohol, spicy foods, foods high in acid and drinks with caffeine may cause irritation or frequency and should be used in moderation. To keep your urine flowing freely and avoid constipation, drink plenty of fluids during the day (8-10 glasses). Tip: Avoid cranberry juice because it is very acidic.  Activity:  Your physical activity doesn't need to be restricted. However, if you are very active, you may see some blood in the urine. We suggest that you reduce your activity under the circumstances until the bleeding has stopped.  Bowels:  It is important to keep your bowels regular during the postoperative period. Straining with bowel movements can cause bleeding. A bowel movement every other day is reasonable. Use a mild laxative if needed, such as milk of magnesia 2-3 tablespoons, or 2 Dulcolax tablets. Call if you continue to have problems. If you had been taking narcotics for pain, before, during or after your surgery, you may be constipated. Take a laxative if necessary.    Medication:  You should resume your pre-surgery medications unless told not to. In addition you may be given an antibiotic to prevent or treat infection. Antibiotics are not always necessary. All medication should be taken as prescribed until the bottles are finished unless you are having an unusual reaction to one of the drugs.  You may take over-the-counter AZO as needed for burning with urination  You may resume your warfarin 12/05/2020 if urine is clear  Follow-up: You will be contacted with your  pathology results once they return   [facility] [address] [place], [place] [address] [phone]      Please contact your physician with any problems or Same Day Surgery at [phone], Monday through Friday 6 am to 4 pm, or [place] at [facility] number at [phone].

## 2020-12-04 NOTE — Anesthesia Postprocedure Evaluation (Signed)
Anesthesia Post Note  Patient: Theresa Lewis  Procedure(s) Performed: TRANSURETHRAL RESECTION OF BLADDER TUMOR (TURBT)  Patient location during evaluation: PACU Anesthesia Type: General Level of consciousness: awake and alert and oriented Pain management: pain level controlled Vital Signs Assessment: post-procedure vital signs reviewed and stable Respiratory status: spontaneous breathing, nonlabored ventilation and respiratory function stable Cardiovascular status: blood pressure returned to baseline and stable Postop Assessment: no signs of nausea or vomiting Anesthetic complications: no   No notable events documented.   Last Vitals:  Vitals:   12/04/20 1015 12/04/20 1028  BP: 125/82 134/73  Pulse: (!) 57 (!) 50  Resp: (!) 26 16  Temp: 36.5 C (!) 36.1 C  SpO2: 100% 100%    Last Pain:  Vitals:   12/04/20 1028  TempSrc: Temporal  PainSc: 0-No pain                 Erielle Gawronski

## 2020-12-04 NOTE — H&P (Signed)
Urology H&P  Urologic history: 1.  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  Recent surveillance cystoscopy remarkable for a 5 mm posterior wall tumor and a 2 cm papillary tumor in the bladder neck.  She presents for TURBT.  Past Medical History:  Diagnosis Date   ABNORMAL VAGINAL BLEEDING 04/19/2007   Qualifier: Diagnosis of  By: Hulan Saas, CMA (AAMA), Quita Skye    Anemia    h/o as a child   Cancer ([place])    Complication of anesthesia    stays asleep for a "very long time"   Deep venous thrombosis ([place])    2006    3 x clotting    GERD (gastroesophageal reflux disease)    HEMORRHOIDS, WITH BLEEDING 12/26/2008   Qualifier: Diagnosis of  By: Regis Bill MD, Standley Brooking    PE (pulmonary embolism) 07/09/2010   Pre-diabetes    Pulmonary embolism ([place])     Past Surgical History:  Procedure Laterality Date   MYOMECTOMY     rectal tear     TRANSURETHRAL RESECTION OF BLADDER TUMOR N/A 04/24/2020   Procedure: TRANSURETHRAL RESECTION OF BLADDER TUMOR (TURBT);  Surgeon: Abbie Sons, MD;  Location: [facility] ORS;  Service: Urology;  Laterality: N/A;   TRANSURETHRAL RESECTION OF BLADDER TUMOR N/A 05/15/2020   Procedure: TRANSURETHRAL RESECTION OF BLADDER TUMOR (TURBT);  Surgeon: Abbie Sons, MD;  Location: [facility] ORS;  Service: Urology;  Laterality: N/A;   TUBAL LIGATION     vena caval filter      Home Medications:  Current Meds  Medication Sig   amoxicillin (AMOXIL) 875 MG tablet Take 1 tablet (875 mg total) by mouth every 12 (twelve) hours.   gabapentin (NEURONTIN) 100 MG capsule Take 1 capsule by mouth three times weekly as needed. (Patient taking differently: Take 100 mg by mouth See admin instructions. Take 1  capsule by mouth three times weekly as needed for pain)   omeprazole (PRILOSEC) 20 MG capsule TAKE 1 CAPSULE BY MOUTH THREE TIMES WEEKLY. (Patient taking differently: Take 20 mg by mouth 3 (three) times a week. Take 1 capsule by mouth three times weekly.)   polyethylene glycol (MIRALAX / GLYCOLAX) 17 g packet Take 17 g by mouth daily as needed for moderate constipation.   warfarin (COUMADIN) 5 MG tablet TAKE 1 1/2 TABLETS BY MOUTH DAILY EXCEPT TAKE 2 TABLETS ON WED OR AS DIRECTED BY COUMADIN CLINIC. (Patient taking differently: Take 7.5-10 mg by mouth See admin instructions. as directed by coumadin clinic. Mon Wed Fri, 10 mg; all other days 7.5 mg)    Allergies:  Allergies  Allergen Reactions   Clindamycin/Lincomycin     "unknown"   Tape Other (See Comments)    When pt has a band-aid on, the impression of band-aid stays on for a month    Family History  Problem Relation Age of Onset   Lung cancer Father 87       april 12    Pancreatic cancer Father 47   Hypertension Mother    Rheum arthritis Mother    Diabetes Paternal Aunt        x2   Colon cancer Neg Hx    Colon polyps Neg Hx    Kidney disease Neg Hx  Esophageal cancer Neg Hx    Gallbladder disease Neg Hx    Rectal cancer Neg Hx    Stomach cancer Neg Hx     Social History:  reports that she has quit smoking. Her smoking use included cigarettes. She has never used smokeless tobacco. She reports previous alcohol use. She reports that she does not use drugs.  ROS: Otherwise noncontributory except as per the HPI  Physical Exam:  Vital signs in last 24 hours: Temp:  [96.6 F (35.9 C)] 96.6 F (35.9 C) (07/12 0743) Pulse Rate:  [58] 58 (07/12 0743) Resp:  [16] 16 (07/12 0743) BP: (129)/(88) 129/88 (07/12 0743) SpO2:  [98 %] 98 % (07/12 0743) Weight:  [96.6 kg] 96.6 kg (07/12 0743) Constitutional:  Alert and oriented, No acute distress HEENT: [place] AT, moist mucus membranes.  Trachea midline, no masses Cardiovascular:  Regular rate and rhythm, no clubbing, cyanosis, or edema. Respiratory: Normal respiratory effort, lungs clear bilaterally GI: Abdomen is soft, nontender, nondistended, no abdominal masses GU: No CVA tenderness Skin: No rashes, bruises or suspicious lesions Lymph: No cervical or inguinal adenopathy Neurologic: Grossly intact, no focal deficits, moving all 4 extremities Psychiatric: Normal mood and affect   Laboratory Data:  No results for input(s): WBC, HGB, HCT in the last 72 hours. No results for input(s): NA, K, CL, CO2, GLUCOSE, BUN, CREATININE, CALCIUM in the last 72 hours. Recent Labs    12/04/20 0747  INR 1.2   No results for input(s): LABURIN in the last 72 hours. Results for orders placed or performed in visit on 11/20/20  CULTURE, URINE COMPREHENSIVE     Status: Abnormal   Collection Time: 11/20/20 10:16 AM   Specimen: Urine   UR  Result Value Ref Range Status   Urine Culture, Comprehensive Final report (A)  Final   Organism ID, Bacteria Enterococcus faecalis (A)  Final    Comment: For Enterococcus species, aminoglycosides (except for high-level resistance screening), cephalosporins, clindamycin, and trimethoprim-sulfamethoxazole are not effective clinically. (CLSI, M100-S26, 2016) 50,000-100,000 colony forming units per mL    Organism ID, Bacteria Comment  Final    Comment: Mixed urogenital flora 10,000-25,000 colony forming units per mL    ANTIMICROBIAL SUSCEPTIBILITY Comment  Final    Comment:       ** S = Susceptible; I = Intermediate; R = Resistant **                    P = Positive; N = Negative             MICS are expressed in micrograms per mL    Antibiotic                 RSLT#1    RSLT#2    RSLT#3    RSLT#4 Ciprofloxacin                  S Levofloxacin                   S Nitrofurantoin                 S Penicillin                     S Tetracycline                   R Vancomycin                     S  Impression/Assessment:  Recurrent  urothelial carcinoma bladder  Plan:  Transurethral resection bladder tumor The procedure has been discussed in detail as per the last cystoscopy note.  All questions were answered and she desires to proceed.  12/04/2020, 8:32 AM  John Giovanni, MD

## 2020-12-05 LAB — SURGICAL PATHOLOGY

## 2020-12-08 ENCOUNTER — Telehealth: Payer: Self-pay | Admitting: Urology

## 2020-12-08 NOTE — Telephone Encounter (Signed)
I contacted Theresa Lewis 12/07/2020 to discuss your bladder pathology results.  Since her procedure she has had irritative voiding symptoms which have significantly improved.  Pathology report remarkable for Ta high-grade urothelial carcinoma.  She had recurrence after BCG induction for T1 urothelial carcinoma  Since her most recent pathology is Ta high-grade BCG reinduction is recommended.  She is agreeable to proceed and will schedule to begin treatments in approximately 4-5 weeks

## 2020-12-11 ENCOUNTER — Ambulatory Visit: Payer: BC Managed Care – PPO

## 2020-12-11 ENCOUNTER — Other Ambulatory Visit: Payer: Self-pay

## 2020-12-11 DIAGNOSIS — Z7901 Long term (current) use of anticoagulants: Secondary | ICD-10-CM

## 2020-12-11 LAB — POCT INR: INR: 2.7 (ref 2.0–3.0)

## 2020-12-11 NOTE — Patient Instructions (Addendum)
Pre visit review using our clinic review tool, if applicable. No additional management support is needed unless otherwise documented below in the visit note.  Continue 7.5mg  daily except take 10mg  on Mon, Wed, and Fri and recheck in 4 wks.

## 2020-12-18 ENCOUNTER — Telehealth: Payer: Self-pay | Admitting: Family Medicine

## 2020-12-18 NOTE — Telephone Encounter (Signed)
Patient called stating she would like to go back to work on Monday and will need a letter to be released to go back.  She also needs a letter that says she needs to work at least 2 days from home. As she is having BCG and will need to work from home on Thursdays and Fridays

## 2020-12-19 NOTE — Telephone Encounter (Signed)
Okay to do letters

## 2020-12-28 ENCOUNTER — Ambulatory Visit: Payer: Self-pay | Admitting: Physician Assistant

## 2020-12-28 NOTE — Telephone Encounter (Signed)
Called patient and discussed BCG treatment information. Patient has had treatments previously in the past and is familiar. Appointments were scheduled and BCG education information was sent via my chart as a reminder

## 2021-01-10 ENCOUNTER — Ambulatory Visit: Payer: BC Managed Care – PPO

## 2021-01-11 ENCOUNTER — Other Ambulatory Visit: Payer: Self-pay

## 2021-01-11 ENCOUNTER — Ambulatory Visit (INDEPENDENT_AMBULATORY_CARE_PROVIDER_SITE_OTHER): Payer: BC Managed Care – PPO | Admitting: *Deleted

## 2021-01-11 ENCOUNTER — Ambulatory Visit (INDEPENDENT_AMBULATORY_CARE_PROVIDER_SITE_OTHER): Payer: BC Managed Care – PPO

## 2021-01-11 DIAGNOSIS — Z7901 Long term (current) use of anticoagulants: Secondary | ICD-10-CM | POA: Diagnosis not present

## 2021-01-11 DIAGNOSIS — N3289 Other specified disorders of bladder: Secondary | ICD-10-CM

## 2021-01-11 DIAGNOSIS — C679 Malignant neoplasm of bladder, unspecified: Secondary | ICD-10-CM | POA: Diagnosis not present

## 2021-01-11 LAB — POCT INR: INR: 4.9 — AB (ref 2.0–3.0)

## 2021-01-11 LAB — URINALYSIS, COMPLETE
Bilirubin, UA: NEGATIVE
Glucose, UA: NEGATIVE
Ketones, UA: NEGATIVE
Leukocytes,UA: NEGATIVE
Nitrite, UA: NEGATIVE
Protein,UA: NEGATIVE
Specific Gravity, UA: 1.025 (ref 1.005–1.030)
Urobilinogen, Ur: 0.2 mg/dL (ref 0.2–1.0)
pH, UA: 6.5 (ref 5.0–7.5)

## 2021-01-11 LAB — MICROSCOPIC EXAMINATION: Epithelial Cells (non renal): >10 /hpf — AB (ref 0–10)

## 2021-01-11 MED ORDER — BCG LIVE 50 MG IS SUSR
3.2400 mL | Freq: Once | INTRAVESICAL | Status: AC
  Administered 2021-01-11: 81 mg via INTRAVESICAL

## 2021-01-11 NOTE — Patient Instructions (Addendum)
Pre visit review using our clinic review tool, if applicable. No additional management support is needed unless otherwise documented below in the visit note.  Hold dose today and hold tomorrow continue 7.'5mg'$  daily and recheck in 2 wks.

## 2021-01-11 NOTE — Progress Notes (Signed)
.  BCG Bladder Instillation   BCG # 1    Due to Bladder Cancer patient is present today for a BCG treatment. Patient was cleaned and prepped in a sterile fashion with betadine. A 14FR catheter was inserted, urine return was noted 39m, urine was yellow in color.  539mof reconstituted BCG was instilled into the bladder. The catheter was then removed. Patient tolerated well, no complications were noted   Preformed by: JeGaspar ColaMA and CaElberta LeatherwoodMA   Follow up/ Additional notes: 1 week

## 2021-01-11 NOTE — Patient Instructions (Signed)

## 2021-01-13 NOTE — Progress Notes (Addendum)
Agree.  With hold as listed.  Was the likely cause for the elevation identified?  Please let me know.   ==============================   [name], RN  Tonia Ghent, MD Pt did report her appetite has not been very good over the last 2 weeks due to worrying about her upcoming BCG cancer treatment. This could have effected her INR today. Pt has had problems in the past with anxiety and this affecting her appetite and INR.  Shannon         ============================== See above.  Routed to PCP as FYI.

## 2021-01-18 ENCOUNTER — Ambulatory Visit (INDEPENDENT_AMBULATORY_CARE_PROVIDER_SITE_OTHER): Payer: BC Managed Care – PPO | Admitting: *Deleted

## 2021-01-18 ENCOUNTER — Other Ambulatory Visit: Payer: Self-pay

## 2021-01-18 DIAGNOSIS — C679 Malignant neoplasm of bladder, unspecified: Secondary | ICD-10-CM | POA: Diagnosis not present

## 2021-01-18 LAB — URINALYSIS, COMPLETE
Bilirubin, UA: NEGATIVE
Glucose, UA: NEGATIVE
Ketones, UA: NEGATIVE
Leukocytes,UA: NEGATIVE
Nitrite, UA: NEGATIVE
Protein,UA: NEGATIVE
Specific Gravity, UA: 1.025 (ref 1.005–1.030)
Urobilinogen, Ur: 0.2 mg/dL (ref 0.2–1.0)
pH, UA: 5 (ref 5.0–7.5)

## 2021-01-18 LAB — MICROSCOPIC EXAMINATION

## 2021-01-18 MED ORDER — BCG LIVE 50 MG IS SUSR
3.2400 mL | Freq: Once | INTRAVESICAL | Status: AC
  Administered 2021-01-18: 81 mg via INTRAVESICAL

## 2021-01-18 NOTE — Progress Notes (Signed)
BCG Bladder Instillation  BCG # 2  Due to Bladder Cancer patient is present today for a BCG treatment. Patient was cleaned and prepped in a sterile fashion with betadine. A 14FR catheter was inserted, urine return was noted 48m, urine was yellow in color.  512mof reconstituted BCG was instilled into the bladder. The catheter was then removed. Patient tolerated well, no complications were noted  Performed by: ReVerlene MayerCMA and AlKerman PasseyRMA  Follow up/ Additional notes: as scheduled

## 2021-01-25 ENCOUNTER — Encounter: Payer: Self-pay | Admitting: Physician Assistant

## 2021-01-25 ENCOUNTER — Other Ambulatory Visit: Payer: Self-pay

## 2021-01-25 ENCOUNTER — Ambulatory Visit (INDEPENDENT_AMBULATORY_CARE_PROVIDER_SITE_OTHER): Payer: BC Managed Care – PPO | Admitting: Physician Assistant

## 2021-01-25 ENCOUNTER — Ambulatory Visit (INDEPENDENT_AMBULATORY_CARE_PROVIDER_SITE_OTHER): Payer: BC Managed Care – PPO

## 2021-01-25 VITALS — BP 131/80 | HR 53 | Temp 98.0°F | Ht 66.0 in | Wt 213.0 lb

## 2021-01-25 DIAGNOSIS — Z7901 Long term (current) use of anticoagulants: Secondary | ICD-10-CM

## 2021-01-25 DIAGNOSIS — C679 Malignant neoplasm of bladder, unspecified: Secondary | ICD-10-CM

## 2021-01-25 LAB — POCT INR: INR: 2.4 (ref 2.0–3.0)

## 2021-01-25 MED ORDER — AMOXICILLIN-POT CLAVULANATE 875-125 MG PO TABS: 1.0000 | ORAL_TABLET | Freq: Two times a day (BID) | ORAL | 0 refills | Status: AC

## 2021-01-25 NOTE — Progress Notes (Signed)
Patient presented to the clinic today for a scheduled BCG instillation.  Urine grossly infected on UA, culture pending.  Patient reports her normal low grade fever, body aches, and dysuria following treatment. She is afebrile and normotensive in clinic today, nontoxic appearing.  Will treat for acute UTI today.  Prescription for Augmentin sent to pharmacy.  Patient to return to clinic next week for next scheduled BCG treatment.  We will add an additional treatment to her scheduled series to make up for today's missed instillation.  Debroah Loop, PA-C 01/25/21 9:45 AM

## 2021-01-25 NOTE — Patient Instructions (Addendum)
Pre visit review using our clinic review tool, if applicable. No additional management support is needed unless otherwise documented below in the visit note.  Increase dose today to '10mg'$  and then continue 7.'5mg'$  daily and recheck in 4 wks.

## 2021-01-28 LAB — CULTURE, URINE COMPREHENSIVE

## 2021-01-29 LAB — URINALYSIS, COMPLETE
Bilirubin, UA: NEGATIVE
Glucose, UA: NEGATIVE
Nitrite, UA: NEGATIVE
Specific Gravity, UA: 1.025 (ref 1.005–1.030)
Urobilinogen, Ur: 1 mg/dL (ref 0.2–1.0)
pH, UA: 6.5 (ref 5.0–7.5)

## 2021-01-29 LAB — MICROSCOPIC EXAMINATION: WBC, UA: >30 /hpf — AB (ref 0–5)

## 2021-02-01 ENCOUNTER — Ambulatory Visit (INDEPENDENT_AMBULATORY_CARE_PROVIDER_SITE_OTHER): Payer: BC Managed Care – PPO | Admitting: Physician Assistant

## 2021-02-01 ENCOUNTER — Other Ambulatory Visit: Payer: Self-pay

## 2021-02-01 DIAGNOSIS — C679 Malignant neoplasm of bladder, unspecified: Secondary | ICD-10-CM

## 2021-02-01 LAB — URINALYSIS, COMPLETE
Bilirubin, UA: NEGATIVE
Glucose, UA: NEGATIVE
Ketones, UA: NEGATIVE
Leukocytes,UA: NEGATIVE
Nitrite, UA: NEGATIVE
RBC, UA: NEGATIVE
Specific Gravity, UA: 1.02 (ref 1.005–1.030)
Urobilinogen, Ur: 1 mg/dL (ref 0.2–1.0)
pH, UA: 7 (ref 5.0–7.5)

## 2021-02-01 LAB — MICROSCOPIC EXAMINATION: Bacteria, UA: NONE SEEN

## 2021-02-01 MED ORDER — BCG LIVE 50 MG IS SUSR
3.2400 mL | Freq: Once | INTRAVESICAL | Status: AC
  Administered 2021-02-01: 81 mg via INTRAVESICAL

## 2021-02-01 NOTE — Progress Notes (Signed)
BCG Bladder Instillation  BCG # 3 of 6  Due to Bladder Cancer patient is present today for a BCG treatment. Patient was cleaned and prepped in a sterile fashion with betadine. A 14FR catheter was inserted, urine return was noted 80m, urine was yellow in color.  592mof reconstituted BCG was instilled into the bladder. The catheter was then removed. Patient tolerated well, no complications were noted  Performed by: SaDebroah LoopPA-C and CrBradly BienenstockCMA  Additional notes: Patient reports some urgency toward the end of the 2 hours holding period. She is holding fluids on the morning of her instillations already. If this persists, consider decreasing instillation volume to 3070m Follow up: 1 week for BCG #4 of 6

## 2021-02-08 ENCOUNTER — Other Ambulatory Visit: Payer: Self-pay

## 2021-02-08 ENCOUNTER — Ambulatory Visit (INDEPENDENT_AMBULATORY_CARE_PROVIDER_SITE_OTHER): Payer: BC Managed Care – PPO

## 2021-02-08 DIAGNOSIS — C679 Malignant neoplasm of bladder, unspecified: Secondary | ICD-10-CM

## 2021-02-08 LAB — URINALYSIS, COMPLETE
Bilirubin, UA: NEGATIVE
Glucose, UA: NEGATIVE
Nitrite, UA: NEGATIVE
RBC, UA: NEGATIVE
Specific Gravity, UA: 1.025 (ref 1.005–1.030)
Urobilinogen, Ur: 0.2 mg/dL (ref 0.2–1.0)
pH, UA: 6.5 (ref 5.0–7.5)

## 2021-02-08 LAB — MICROSCOPIC EXAMINATION
Epithelial Cells (non renal): >10 /hpf — AB (ref 0–10)
RBC, Urine: NONE SEEN /hpf (ref 0–2)

## 2021-02-08 MED ORDER — BCG LIVE 50 MG IS SUSR
3.2400 mL | Freq: Once | INTRAVESICAL | Status: AC
  Administered 2021-02-08: 81 mg via INTRAVESICAL

## 2021-02-08 NOTE — Progress Notes (Signed)
BCG Bladder Instillation  BCG # 4 of 6  Due to Bladder Cancer patient is present today for a BCG treatment. Patient was cleaned and prepped in a sterile fashion with betadine. A 14FR catheter was inserted, urine return was noted 68m, urine was yellow in color. 516mof reconstituted BCG was instilled into the bladder. The catheter was then removed. Patient tolerated well, no complications were noted.  Preformed by: OkGordy ClementCMA   Follow up/ Additional notes: RTC in 1 week as scheduled.

## 2021-02-15 ENCOUNTER — Ambulatory Visit (INDEPENDENT_AMBULATORY_CARE_PROVIDER_SITE_OTHER): Payer: BC Managed Care – PPO | Admitting: Physician Assistant

## 2021-02-15 ENCOUNTER — Other Ambulatory Visit: Payer: Self-pay

## 2021-02-15 DIAGNOSIS — C679 Malignant neoplasm of bladder, unspecified: Secondary | ICD-10-CM | POA: Diagnosis not present

## 2021-02-15 LAB — MICROSCOPIC EXAMINATION: Bacteria, UA: NONE SEEN

## 2021-02-15 LAB — URINALYSIS, COMPLETE
Bilirubin, UA: NEGATIVE
Glucose, UA: NEGATIVE
Ketones, UA: NEGATIVE
Nitrite, UA: NEGATIVE
Protein,UA: NEGATIVE
Specific Gravity, UA: 1.025 (ref 1.005–1.030)
Urobilinogen, Ur: 0.2 mg/dL (ref 0.2–1.0)
pH, UA: 5.5 (ref 5.0–7.5)

## 2021-02-15 MED ORDER — BCG LIVE 50 MG IS SUSR
3.2400 mL | Freq: Once | INTRAVESICAL | Status: AC
  Administered 2021-02-15: 81 mg via INTRAVESICAL

## 2021-02-15 NOTE — Progress Notes (Signed)
BCG Bladder Instillation  BCG # 5 of 6  Due to Bladder Cancer patient is present today for a BCG treatment. Patient was cleaned and prepped in a sterile fashion with betadine. A 14FR catheter was inserted, urine return was noted 58ml, urine was yellow in color.  69ml of reconstituted BCG was instilled into the bladder. The catheter was then removed. Patient tolerated well, no complications were noted  Performed by: Debroah Loop, PA-C   Follow up/ Additional notes: 1 week for BCG #6 of 6

## 2021-02-22 ENCOUNTER — Ambulatory Visit (INDEPENDENT_AMBULATORY_CARE_PROVIDER_SITE_OTHER): Payer: BC Managed Care – PPO | Admitting: Physician Assistant

## 2021-02-22 ENCOUNTER — Ambulatory Visit: Payer: BC Managed Care – PPO

## 2021-02-22 ENCOUNTER — Other Ambulatory Visit: Payer: Self-pay

## 2021-02-22 DIAGNOSIS — C679 Malignant neoplasm of bladder, unspecified: Secondary | ICD-10-CM | POA: Diagnosis not present

## 2021-02-22 LAB — URINALYSIS, COMPLETE
Bilirubin, UA: NEGATIVE
Glucose, UA: NEGATIVE
Ketones, UA: NEGATIVE
Nitrite, UA: NEGATIVE
RBC, UA: NEGATIVE
Specific Gravity, UA: 1.025 (ref 1.005–1.030)
Urobilinogen, Ur: 0.2 mg/dL (ref 0.2–1.0)
pH, UA: 6.5 (ref 5.0–7.5)

## 2021-02-22 LAB — MICROSCOPIC EXAMINATION: RBC, Urine: NONE SEEN /hpf (ref 0–2)

## 2021-02-22 MED ORDER — BCG LIVE 50 MG IS SUSR
3.2400 mL | Freq: Once | INTRAVESICAL | Status: AC
  Administered 2021-02-22: 81 mg via INTRAVESICAL

## 2021-02-22 MED ORDER — BCG LIVE 50 MG IS SUSR: 3.2400 mL | Freq: Once | INTRAVESICAL | Status: DC

## 2021-02-22 NOTE — Progress Notes (Signed)
BCG Bladder Instillation  BCG # 6 of 6  Due to Bladder Cancer patient is present today for a BCG treatment. Patient was cleaned and prepped in a sterile fashion with betadine. A 14FR catheter was inserted, urine return was noted 10ml, urine was yellow in color.  35ml of reconstituted BCG was instilled into the bladder. The catheter was then removed. Patient tolerated well, no complications were noted  Performed by: Debroah Loop, PA-C and DeShannon Tamala Julian, CMA  Follow up/ Additional notes: 3 month cysto with Dr. Bernardo Heater

## 2021-03-01 ENCOUNTER — Telehealth: Payer: Self-pay

## 2021-03-01 ENCOUNTER — Ambulatory Visit (INDEPENDENT_AMBULATORY_CARE_PROVIDER_SITE_OTHER): Payer: BC Managed Care – PPO

## 2021-03-01 ENCOUNTER — Other Ambulatory Visit: Payer: Self-pay

## 2021-03-01 DIAGNOSIS — Z7901 Long term (current) use of anticoagulants: Secondary | ICD-10-CM

## 2021-03-01 LAB — POCT INR: INR: 2 (ref 2.0–3.0)

## 2021-03-01 NOTE — Patient Instructions (Addendum)
Pre visit review using our clinic review tool, if applicable. No additional management support is needed unless otherwise documented below in the visit note.  Increase dose today to 10mg  and increase dose tomorrow to 10 mg and then change weekly dose to take  7.5mg  daily except take 10 mg on Mon and Thurs and recheck in 4 wks.

## 2021-03-01 NOTE — Telephone Encounter (Signed)
Patient elected to pick up 1 months worth of samples from office. Appt made for 1 mo PVR.

## 2021-03-01 NOTE — Telephone Encounter (Signed)
Patient called complaining of what she believes to be having is bladder spasms she states she has spoke to [facility] and if it continued after BCG we can call in some medication. She denies any uti symptoms, fever, chills, dysuria, etc she uses [facility] in [place]

## 2021-03-03 NOTE — Progress Notes (Signed)
Agree. Thanks

## 2021-03-22 ENCOUNTER — Other Ambulatory Visit: Payer: Self-pay

## 2021-03-22 ENCOUNTER — Ambulatory Visit (INDEPENDENT_AMBULATORY_CARE_PROVIDER_SITE_OTHER): Payer: BC Managed Care – PPO

## 2021-03-22 DIAGNOSIS — Z7901 Long term (current) use of anticoagulants: Secondary | ICD-10-CM | POA: Diagnosis not present

## 2021-03-22 LAB — POCT INR: INR: 2.2 (ref 2.0–3.0)

## 2021-03-22 NOTE — Patient Instructions (Addendum)
Pre visit review using our clinic review tool, if applicable. No additional management support is needed unless otherwise documented below in the visit note.  Increase dose today to 12.5 mg and increase dose tomorrow to 12.5 mg and then continue 7.5mg  daily except take 10 mg on Mon and Thurs and recheck in 3 wks.

## 2021-03-27 ENCOUNTER — Other Ambulatory Visit: Payer: Self-pay | Admitting: Primary Care

## 2021-03-27 DIAGNOSIS — D6869 Other thrombophilia: Secondary | ICD-10-CM

## 2021-03-27 DIAGNOSIS — Z86718 Personal history of other venous thrombosis and embolism: Secondary | ICD-10-CM

## 2021-03-27 NOTE — Telephone Encounter (Signed)
Called patient and got her scheduled for 12/21 at 8:20

## 2021-03-27 NOTE — Telephone Encounter (Signed)
Patient is due for CPE with me.  Can we get her scheduled within the next few months? Okay to wait until back at [facility] if she prefers.

## 2021-03-29 ENCOUNTER — Ambulatory Visit: Payer: BC Managed Care – PPO

## 2021-03-29 ENCOUNTER — Other Ambulatory Visit: Payer: Self-pay

## 2021-03-29 ENCOUNTER — Encounter: Payer: Self-pay | Admitting: Physician Assistant

## 2021-03-29 ENCOUNTER — Ambulatory Visit: Payer: BC Managed Care – PPO | Admitting: Physician Assistant

## 2021-03-29 VITALS — BP 135/80 | HR 54 | Ht 65.0 in | Wt 214.0 lb

## 2021-03-29 DIAGNOSIS — N3289 Other specified disorders of bladder: Secondary | ICD-10-CM | POA: Diagnosis not present

## 2021-03-29 LAB — BLADDER SCAN AMB NON-IMAGING

## 2021-03-29 MED ORDER — MIRABEGRON ER 25 MG PO TB24: 25.0000 mg | ORAL_TABLET | Freq: Every day | ORAL | 0 refills | Status: DC

## 2021-03-29 NOTE — Progress Notes (Signed)
03/29/2021 12:08 PM   Theresa Lewis [birth year] [id number]  CC: Chief Complaint  Patient presents with   Bladder Cancer    HPI: Theresa Lewis is a 60 y.o. 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   Today she reports significant improvement in her frequency, urgency, pressure, and stinging on Myrbetriq.  Overall she is pleased on this medication and wonders if she should continue it.  PVR 0 mL.  PMH: Past Medical History:  Diagnosis Date   ABNORMAL VAGINAL BLEEDING 04/19/2007   Qualifier: Diagnosis of  By: Hulan Saas, CMA (AAMA), Quita Skye    Anemia    h/o as a child   Cancer ([place])    Complication of anesthesia    stays asleep for a "very long time"   Deep venous thrombosis ([place])    2006    3 x clotting    GERD (gastroesophageal reflux disease)    HEMORRHOIDS, WITH BLEEDING 12/26/2008   Qualifier: Diagnosis of  By: Regis Bill MD, Standley Brooking    PE (pulmonary embolism) 07/09/2010   Pre-diabetes    Pulmonary embolism ([place])     Surgical History: Past Surgical History:  Procedure Laterality Date   MYOMECTOMY     rectal tear     TRANSURETHRAL RESECTION OF BLADDER TUMOR N/A 04/24/2020   Procedure: TRANSURETHRAL RESECTION OF BLADDER TUMOR (TURBT);  Surgeon: Abbie Sons, MD;  Location: [facility] OR[facility];  Service: Urology;  Laterality: N/A;   TRANSURETHRAL RESECTION OF BLADDER TUMOR N/A 05/15/2020   Procedure: TRANSURETHRAL RESECTION OF BLADDER TUMOR (TURBT);  Surgeon: Abbie Sons, MD;  Location: [facility] OR[facility];  Service: Urology;  Laterality: N/A;   TRANSURETHRAL RESECTION OF BLADDER TUMOR N/A 12/04/2020   Procedure: TRANSURETHRAL RESECTION OF BLADDER TUMOR (TURBT);  Surgeon: Abbie Sons, MD;  Location: [facility] OR[facility];  Service: Urology;  Laterality: N/A;   TUBAL LIGATION     vena caval  filter      Home Medications:  Allergies as of 03/29/2021       Reactions   Clindamycin/lincomycin    "unknown"   Tape Other (See Comments)   When pt has a band-aid on, the impression of band-aid stays on for a month        Medication List        Accurate as of March 29, 2021 12:08 PM. If you have any questions, ask your nurse or doctor.          gabapentin 100 MG capsule Commonly known as: NEURONTIN Take 1 capsule by mouth three times weekly as needed.   mirabegron ER 25 MG Tb24 tablet Commonly known as: MYRBETRIQ Take 1 tablet (25 mg total) by mouth daily. Started by: Debroah Loop, PA-C   omeprazole 20 MG capsule Commonly known as: PRILOSEC TAKE 1 CAPSULE BY MOUTH THREE TIMES WEEKLY.   polyethylene glycol 17 g packet Commonly known as: MIRALAX / GLYCOLAX Take 17 g by mouth daily as needed for moderate constipation.   warfarin 5 MG tablet Commonly known as: COUMADIN Take as directed by the anticoagulation clinic. If you are unsure how to take this medication, talk to your nurse or doctor. Original instructions: TAKE 1 & 1/2 TABLETS BY MOUTH DAILY EXCEPT TAKE 2 TABLETS ON Monday and Thursday, OR AS DIRECTED BY COUMADIN CLINIC.        Allergies:  Allergies  Allergen Reactions   Clindamycin/Lincomycin     "unknown"   Tape Other (See Comments)    When pt has a band-aid on, the impression of band-aid stays on for a month    Family History: Family History  Problem Relation Age of Onset   Lung cancer Father 99       april 12    Pancreatic cancer Father 59   Hypertension Mother    Rheum arthritis Mother    Diabetes Paternal Aunt        x2   Colon cancer Neg Hx    Colon polyps Neg Hx    Kidney disease Neg Hx    Esophageal cancer Neg Hx    Gallbladder disease Neg Hx    Rectal cancer Neg Hx    Stomach cancer Neg Hx     Social History:   reports that she has quit smoking. Her smoking use included cigarettes. She has never used smokeless  tobacco. She reports that she does not currently use alcohol. She reports that she does not use drugs.  Physical Exam: BP 135/80   Pulse (!) 54   Ht 5\' 5"  (1.651 m)   Wt 214 lb (97.1 kg)   BMI 35.61 kg/m   Constitutional:  Alert and oriented, no acute distress, nontoxic appearing HEENT: [place], AT Cardiovascular: No clubbing, cyanosis, or edema Respiratory: Normal respiratory effort, no increased work of breathing Skin: No rashes, bruises or suspicious lesions Neurologic: Grossly intact, no focal deficits, moving all 4 extremities Psychiatric: Normal mood and affect  Laboratory Data: Results for orders placed or performed in visit on 03/29/21  Bladder Scan (Post Void Residual) in office  Result Value Ref Range   Scan Result 8mL    Assessment & Plan:   1. Bladder spasm Symptoms significantly improved to resolved on Myrbetriq 25 mg daily.  We discussed continuing this with plans for drug holiday when she feels comfortable.  Her insurance does not cover Myrbetriq, so I provided her with 2 additional months of samples.  I explained that I think a drug holiday would be appropriate after 3 months of continuous medication.  Patient to keep me updated via telephone or MyChart on her progress, okay to continue the medication if needed. - Bladder Scan (Post Void Residual) in office - mirabegron ER (MYRBETRIQ) 25 MG TB24 tablet; Take 1 tablet (25 mg total) by mouth daily.  Dispense: 56 tablet; Refill: 0  Return if symptoms worsen or fail to improve.  Debroah Loop, PA-C  [facility] [address] [place], [place] [address] [phone]

## 2021-04-12 ENCOUNTER — Other Ambulatory Visit: Payer: Self-pay

## 2021-04-12 ENCOUNTER — Ambulatory Visit: Payer: BC Managed Care – PPO

## 2021-04-12 DIAGNOSIS — Z7901 Long term (current) use of anticoagulants: Secondary | ICD-10-CM

## 2021-04-12 LAB — POCT INR: INR: 5.3 — AB (ref 2.0–3.0)

## 2021-04-12 NOTE — Progress Notes (Signed)
Hold today and tomorrow and then change weekly dose to take 1 1/2 tablets daily except take 1 tablet on Mon and Thurs. Recheck in 2 wks.

## 2021-04-12 NOTE — Patient Instructions (Addendum)
Pre visit review using our clinic review tool, if applicable. No additional management support is needed unless otherwise documented below in the visit note.  Hold today and tomorrow and then change weekly dose to take 1 1/2 tablets daily except take 1 tablet on Mon and Thurs. Recheck in 2 wks.

## 2021-04-21 ENCOUNTER — Other Ambulatory Visit: Payer: Self-pay | Admitting: Primary Care

## 2021-04-21 DIAGNOSIS — K219 Gastro-esophageal reflux disease without esophagitis: Secondary | ICD-10-CM

## 2021-04-26 ENCOUNTER — Other Ambulatory Visit: Payer: Self-pay

## 2021-04-26 ENCOUNTER — Ambulatory Visit: Payer: BC Managed Care – PPO

## 2021-04-26 DIAGNOSIS — Z7901 Long term (current) use of anticoagulants: Secondary | ICD-10-CM

## 2021-04-26 LAB — POCT INR: INR: 1.7 — AB (ref 2.0–3.0)

## 2021-04-26 NOTE — Progress Notes (Signed)
Increase dose today to take  2 1/2 tablets and increase dose tomorrow to take 2 tablets and then continue 1 1/2 tablets daily except take 1 tablet on Mon and Thurs. Recheck in 2 wks.

## 2021-04-26 NOTE — Patient Instructions (Addendum)
Pre visit review using our clinic review tool, if applicable. No additional management support is needed unless otherwise documented below in the visit note.  Increase dose today to take  2 1/2 tablets and increase dose tomorrow to take 2 tablets and then continue 1 1/2 tablets daily except take 1 tablet on Mon and Thurs. Recheck in 2 wks.

## 2021-05-10 ENCOUNTER — Other Ambulatory Visit: Payer: Self-pay

## 2021-05-10 ENCOUNTER — Ambulatory Visit: Payer: BC Managed Care – PPO

## 2021-05-10 DIAGNOSIS — Z7901 Long term (current) use of anticoagulants: Secondary | ICD-10-CM

## 2021-05-10 LAB — POCT INR: INR: 2 (ref 2.0–3.0)

## 2021-05-10 NOTE — Progress Notes (Signed)
Increase dose today to 12.5 mg and then change weekly dose to take 7.5 mg daily. Recheck in 4 weeks.

## 2021-05-10 NOTE — Patient Instructions (Addendum)
Pre visit review using our clinic review tool, if applicable. No additional management support is needed unless otherwise documented below in the visit note.  Increase dose today to 12.5 mg and then change weekly dose to take 7.5 mg daily. Recheck in 4 weeks.

## 2021-05-15 ENCOUNTER — Ambulatory Visit (INDEPENDENT_AMBULATORY_CARE_PROVIDER_SITE_OTHER): Payer: BC Managed Care – PPO | Admitting: Primary Care

## 2021-05-15 ENCOUNTER — Other Ambulatory Visit: Payer: Self-pay

## 2021-05-15 ENCOUNTER — Encounter: Payer: Self-pay | Admitting: Primary Care

## 2021-05-15 ENCOUNTER — Other Ambulatory Visit (HOSPITAL_COMMUNITY)
Admission: RE | Admit: 2021-05-15 | Discharge: 2021-05-15 | Disposition: A | Discharge status: Home / Self Care | Payer: BC Managed Care – PPO | Source: Ambulatory Visit | Attending: Primary Care | Admitting: Primary Care

## 2021-05-15 VITALS — BP 130/76 | HR 64 | Temp 97.6°F | Ht 65.0 in | Wt 213.0 lb

## 2021-05-15 DIAGNOSIS — Z23 Encounter for immunization: Secondary | ICD-10-CM

## 2021-05-15 DIAGNOSIS — R7303 Prediabetes: Secondary | ICD-10-CM

## 2021-05-15 DIAGNOSIS — G500 Trigeminal neuralgia: Secondary | ICD-10-CM | POA: Diagnosis not present

## 2021-05-15 DIAGNOSIS — C679 Malignant neoplasm of bladder, unspecified: Secondary | ICD-10-CM

## 2021-05-15 DIAGNOSIS — Z124 Encounter for screening for malignant neoplasm of cervix: Secondary | ICD-10-CM

## 2021-05-15 DIAGNOSIS — Z7901 Long term (current) use of anticoagulants: Secondary | ICD-10-CM

## 2021-05-15 DIAGNOSIS — K219 Gastro-esophageal reflux disease without esophagitis: Secondary | ICD-10-CM

## 2021-05-15 DIAGNOSIS — Z0000 Encounter for general adult medical examination without abnormal findings: Secondary | ICD-10-CM | POA: Diagnosis not present

## 2021-05-15 DIAGNOSIS — K648 Other hemorrhoids: Secondary | ICD-10-CM

## 2021-05-15 DIAGNOSIS — Z1231 Encounter for screening mammogram for malignant neoplasm of breast: Secondary | ICD-10-CM

## 2021-05-15 DIAGNOSIS — D6861 Antiphospholipid syndrome: Secondary | ICD-10-CM

## 2021-05-15 LAB — COMPREHENSIVE METABOLIC PANEL
ALT: 17 U/L (ref 0–35)
AST: 21 U/L (ref 0–37)
Albumin: 3.8 g/dL (ref 3.5–5.2)
Alkaline Phosphatase: 79 U/L (ref 39–117)
BUN: 11 mg/dL (ref 6–23)
CO2: 28 mEq/L (ref 19–32)
Calcium: 9.4 mg/dL (ref 8.4–10.5)
Chloride: 106 mEq/L (ref 96–112)
Creatinine, Ser: 0.86 mg/dL (ref 0.40–1.20)
GFR: 72.83 mL/min (ref >60.00)
Glucose, Bld: 91 mg/dL (ref 70–99)
Potassium: 4.1 mEq/L (ref 3.5–5.1)
Sodium: 140 mEq/L (ref 135–145)
Total Bilirubin: 0.4 mg/dL (ref 0.2–1.2)
Total Protein: 7.3 g/dL (ref 6.0–8.3)

## 2021-05-15 LAB — CBC
HCT: 34.1 % — ABNORMAL LOW (ref 36.0–46.0)
Hemoglobin: 10.4 g/dL — ABNORMAL LOW (ref 12.0–15.0)
MCHC: 30.6 g/dL (ref 30.0–36.0)
MCV: 75.6 fl — ABNORMAL LOW (ref 78.0–100.0)
Platelets: 404 10*3/uL — ABNORMAL HIGH (ref 150.0–400.0)
RBC: 4.51 Mil/uL (ref 3.87–5.11)
RDW: 17.6 % — ABNORMAL HIGH (ref 11.5–15.5)
WBC: 4.2 10*3/uL (ref 4.0–10.5)

## 2021-05-15 LAB — LIPID PANEL
Cholesterol: 168 mg/dL (ref 0–200)
HDL: 57.4 mg/dL (ref >39.00)
LDL Cholesterol: 90 mg/dL (ref 0–99)
NonHDL: 110.25
Total CHOL/HDL Ratio: 3
Triglycerides: 99 mg/dL (ref 0.0–149.0)
VLDL: 19.8 mg/dL (ref 0.0–40.0)

## 2021-05-15 LAB — HEMOGLOBIN A1C: Hgb A1c MFr Bld: 6.3 % (ref 4.6–6.5)

## 2021-05-15 NOTE — Assessment & Plan Note (Signed)
Completed recent treatments. Pending cystoscopy for next week.  Following with Urology.

## 2021-05-15 NOTE — Assessment & Plan Note (Signed)
Following with Coumadin clinic, recent INR reviewed. Continue current regimen of warfarin.

## 2021-05-15 NOTE — Assessment & Plan Note (Signed)
Using gabapentin 100 mg sparingly, continue same.

## 2021-05-15 NOTE — Assessment & Plan Note (Signed)
No recent episodes.  Due for repeat colonoscopy in 2023.

## 2021-05-15 NOTE — Assessment & Plan Note (Signed)
Discussed the importance of a healthy diet and regular exercise in order for weight loss, and to reduce the risk of further co-morbidity. ? ?Repeat A1C pending. ?

## 2021-05-15 NOTE — Patient Instructions (Signed)
Stop by the lab prior to leaving today. I will notify you of your results once received.   Call the [facility] to schedule your mammogram.   You should receive notification around Spring 2023 from GI regarding your colonoscopy.  It was a pleasure to see you today!  Preventive Care 30-61 Years Old, Female Preventive care refers to lifestyle choices and visits with your health care provider that can promote health and wellness. Preventive care visits are also called wellness exams. What can I expect for my preventive care visit? Counseling Your health care provider may ask you questions about your: Medical history, including: Past medical problems. Family medical history. Pregnancy history. Current health, including: Menstrual cycle. Method of birth control. Emotional well-being. Home life and relationship well-being. Sexual activity and sexual health. Lifestyle, including: Alcohol, nicotine or tobacco, and drug use. Access to firearms. Diet, exercise, and sleep habits. Work and work Statistician. Sunscreen use. Safety issues such as seatbelt and bike helmet use. Physical exam Your health care provider will check your: Height and weight. These may be used to calculate your BMI (body mass index). BMI is a measurement that tells if you are at a healthy weight. Waist circumference. This measures the distance around your waistline. This measurement also tells if you are at a healthy weight and may help predict your risk of certain diseases, such as type 2 diabetes and high blood pressure. Heart rate and blood pressure. Body temperature. Skin for abnormal spots. What immunizations do I need? Vaccines are usually given at various ages, according to a schedule. Your health care provider will recommend vaccines for you based on your age, medical history, and lifestyle or other factors, such as travel or where you work. What tests do I need? Screening Your health care provider may  recommend screening tests for certain conditions. This may include: Lipid and cholesterol levels. Diabetes screening. This is done by checking your blood sugar (glucose) after you have not eaten for a while (fasting). Pelvic exam and Pap test. Hepatitis B test. Hepatitis C test. HIV (human immunodeficiency virus) test. STI (sexually transmitted infection) testing, if you are at risk. Lung cancer screening. Colorectal cancer screening. Mammogram. Talk with your health care provider about when you should start having regular mammograms. This may depend on whether you have a family history of breast cancer. BRCA-related cancer screening. This may be done if you have a family history of breast, ovarian, tubal, or peritoneal cancers. Bone density scan. This is done to screen for osteoporosis. Talk with your health care provider about your test results, treatment options, and if necessary, the need for more tests. Follow these instructions at home: Eating and drinking  Eat a diet that includes fresh fruits and vegetables, whole grains, lean protein, and low-fat dairy products. Take vitamin and mineral supplements as recommended by your health care provider. Do not drink alcohol if: Your health care provider tells you not to drink. You are pregnant, may be pregnant, or are planning to become pregnant. If you drink alcohol: Limit how much you have to 0-1 drink a day. Know how much alcohol is in your drink. In the [place] one drink equals one 12 oz bottle of beer (355 mL), one 5 oz glass of wine (148 mL), or one 1 oz glass of hard liquor (44 mL). Lifestyle Brush your teeth every morning and night with fluoride toothpaste. Floss one time each day. Exercise for at least 30 minutes 5 or more days each week. Do not use  any products that contain nicotine or tobacco. These products include cigarettes, chewing tobacco, and vaping devices, such as e-cigarettes. If you need help quitting, ask your health  care provider. Do not use drugs. If you are sexually active, practice safe sex. Use a condom or other form of protection to prevent STIs. If you do not wish to become pregnant, use a form of birth control. If you plan to become pregnant, see your health care provider for a prepregnancy visit. Take aspirin only as told by your health care provider. Make sure that you understand how much to take and what form to take. Work with your health care provider to find out whether it is safe and beneficial for you to take aspirin daily. Find healthy ways to manage stress, such as: Meditation, yoga, or listening to music. Journaling. Talking to a trusted person. Spending time with friends and family. Minimize exposure to UV radiation to reduce your risk of skin cancer. Safety Always wear your seat belt while driving or riding in a vehicle. Do not drive: If you have been drinking alcohol. Do not ride with someone who has been drinking. When you are tired or distracted. While texting. If you have been using any mind-altering substances or drugs. Wear a helmet and other protective equipment during sports activities. If you have firearms in your house, make sure you follow all gun safety procedures. Seek help if you have been physically or sexually abused. What's next? Visit your health care provider once a year for an annual wellness visit. Ask your health care provider how often you should have your eyes and teeth checked. Stay up to date on all vaccines. This information is not intended to replace advice given to you by your health care provider. Make sure you discuss any questions you have with your health care provider. Document Revised: 11/07/2020 Document Reviewed: 11/07/2020 Elsevier Patient Education  [place].

## 2021-05-15 NOTE — Assessment & Plan Note (Addendum)
Compliant to warfarin as prescribed. Recent INR reviewed. Follows with our Coumadin clinic.  Continue warfarin as prescribed.

## 2021-05-15 NOTE — Progress Notes (Signed)
Subjective:    Patient ID: Theresa Lewis, female    DOB: 07/31/1960, 60 y.o.   MRN: [id number]  HPI  Theresa Lewis is a very pleasant 60 y.o. female who presents today for complete physical and follow up of chronic conditions.   Immunizations: -Tetanus: 2014 -Influenza: Due -Covid-19: 1 J&J vaccine -Shingles:Zostavax   Diet: Fair diet.  Exercise: No regular exercise, active at home.   Eye exam: Completes annually  Dental exam: No recent visit.   Pap Smear: Completed in 2019, due Mammogram: Completed in August 2021 Colonoscopy: Completed in 2020, due 2023  BP Readings from Last 3 Encounters:  05/15/21 130/76  03/29/21 135/80  01/25/21 131/80   Wt Readings from Last 3 Encounters:  05/15/21 213 lb (96.6 kg)  03/29/21 214 lb (97.1 kg)  01/25/21 213 lb (96.6 kg)       Review of Systems  Constitutional:  Negative for unexpected weight change.  HENT:  Negative for rhinorrhea.   Respiratory:  Negative for cough and shortness of breath.   Cardiovascular:  Negative for chest pain.  Gastrointestinal:  Negative for constipation and diarrhea.       Doing well on Colace and Miralax, alternates  Genitourinary:  Negative for difficulty urinating.  Musculoskeletal:  Negative for arthralgias.  Skin:  Negative for rash.  Allergic/Immunologic: Negative for environmental allergies.  Neurological:  Negative for dizziness and headaches.  Psychiatric/Behavioral:  The patient is not nervous/anxious.         Past Medical History:  Diagnosis Date   ABNORMAL VAGINAL BLEEDING 04/19/2007   Qualifier: Diagnosis of  By: Hulan Saas, CMA (AAMA), Larene Beach S    Acute cystitis without hematuria 01/31/2020   Anemia    h/o as a child   Cancer ([place])    Complication of anesthesia    stays asleep for a "very long time"   Deep venous thrombosis ([place])    2006    3 x clotting    GERD (gastroesophageal reflux disease)    HEMORRHOIDS, WITH BLEEDING 12/26/2008   Qualifier: Diagnosis of  By:  Regis Bill MD, Standley Brooking    PE (pulmonary embolism) 07/09/2010   Pre-diabetes    Pulmonary embolism ([place])     Social History   Socioeconomic History   Marital status: Married    Spouse name: Not on file   Number of children: 1   Years of education: Not on file   Highest education level: Not on file  Occupational History   Occupation: student advocate   Tobacco Use   Smoking status: Former    Years: 4.00    Types: Cigarettes   Smokeless tobacco: Never  Vaping Use   Vaping Use: Never used  Substance and Sexual Activity   Alcohol use: Not Currently    Comment: rarely   Drug use: No   Sexual activity: Not on file  Other Topics Concern   Not on file  Social History Narrative   HH of 3  Daughter 46 and self.  hh of 2    No pets.   No ets.   No etoh.    Working for [employer] and [employer] .  45- 50 hours per week.                Social Determinants of Health   Financial Resource Strain: Not on file  Food Insecurity: Not on file  Transportation Needs: Not on file  Physical Activity: Not on file  Stress: Not on file  Social Connections:  Not on file  Intimate Partner Violence: Not on file    Past Surgical History:  Procedure Laterality Date   MYOMECTOMY     rectal tear     TRANSURETHRAL RESECTION OF BLADDER TUMOR N/A 04/24/2020   Procedure: TRANSURETHRAL RESECTION OF BLADDER TUMOR (TURBT);  Surgeon: Abbie Sons, MD;  Location: [facility];  Service: Urology;  Laterality: N/A;   TRANSURETHRAL RESECTION OF BLADDER TUMOR N/A 05/15/2020   Procedure: TRANSURETHRAL RESECTION OF BLADDER TUMOR (TURBT);  Surgeon: Abbie Sons, MD;  Location: [facility];  Service: Urology;  Laterality: N/A;   TRANSURETHRAL RESECTION OF BLADDER TUMOR N/A 12/04/2020   Procedure: TRANSURETHRAL RESECTION OF BLADDER TUMOR (TURBT);  Surgeon: Abbie Sons, MD;  Location: [facility];  Service: Urology;  Laterality: N/A;   TUBAL LIGATION     vena caval filter      Family History  Problem Relation Age  of Onset   Lung cancer Father 59       april 12    Pancreatic cancer Father 16   Hypertension Mother    Rheum arthritis Mother    Diabetes Paternal Aunt        x2   Colon cancer Neg Hx    Colon polyps Neg Hx    Kidney disease Neg Hx    Esophageal cancer Neg Hx    Gallbladder disease Neg Hx    Rectal cancer Neg Hx    Stomach cancer Neg Hx     Allergies  Allergen Reactions   Clindamycin/Lincomycin     "unknown"   Tape Other (See Comments)    When pt has a band-aid on, the impression of band-aid stays on for a month    Current Outpatient Medications on File Prior to Visit  Medication Sig Dispense Refill   gabapentin (NEURONTIN) 100 MG capsule Take 1 capsule by mouth three times weekly as needed. 90 capsule 0   mirabegron ER (MYRBETRIQ) 25 MG TB24 tablet Take 1 tablet (25 mg total) by mouth daily. 56 tablet 0   omeprazole (PRILOSEC) 20 MG capsule Take 1 capsule by mouth three times weekly for heartburn. 36 capsule 0   polyethylene glycol (MIRALAX / GLYCOLAX) 17 g packet Take 17 g by mouth daily as needed for moderate constipation.     warfarin (COUMADIN) 5 MG tablet TAKE 1 & 1/2 TABLETS BY MOUTH DAILY EXCEPT TAKE 2 TABLETS ON Monday and Thursday, OR AS DIRECTED BY COUMADIN CLINIC. 138 tablet 0   No current facility-administered medications on file prior to visit.    BP 130/76    Pulse 64    Temp 97.6 F (36.4 C) (Temporal)    Ht 5\' 5"  (1.651 m)    Wt 213 lb (96.6 kg)    SpO2 98%    BMI 35.45 kg/m  Objective:   Physical Exam HENT:     Right Ear: Tympanic membrane and ear canal normal.     Left Ear: Tympanic membrane and ear canal normal.     Nose: Nose normal.  Eyes:     Conjunctiva/sclera: Conjunctivae normal.     Pupils: Pupils are equal, round, and reactive to light.  Neck:     Thyroid: No thyromegaly.  Cardiovascular:     Rate and Rhythm: Normal rate and regular rhythm.     Heart sounds: No murmur heard. Pulmonary:     Effort: Pulmonary effort is normal.      Breath sounds: Normal breath sounds. No rales.  Abdominal:  General: Bowel sounds are normal.     Palpations: Abdomen is soft.     Tenderness: There is no abdominal tenderness.  Genitourinary:    Labia:        Right: No rash, tenderness or lesion.        Left: No rash, tenderness or lesion.      Vagina: Vaginal discharge present.     Cervix: No cervical motion tenderness.     Uterus: Normal.      Adnexa: Right adnexa normal and left adnexa normal.     Comments: Scant amount of whitish discharge. No foul smell. Musculoskeletal:        General: Normal range of motion.     Cervical back: Neck supple.  Lymphadenopathy:     Cervical: No cervical adenopathy.  Skin:    General: Skin is warm and dry.     Findings: No rash.  Neurological:     Mental Status: She is alert and oriented to person, place, and time.     Cranial Nerves: No cranial nerve deficit.     Deep Tendon Reflexes: Reflexes are normal and symmetric.  Psychiatric:        Mood and Affect: Mood normal.          Assessment & Plan:      This visit occurred during the SARS-CoV-2 public health emergency.  Safety protocols were in place, including screening questions prior to the visit, additional usage of staff PPE, and extensive cleaning of exam room while observing appropriate contact time as indicated for disinfecting solutions.

## 2021-05-15 NOTE — Assessment & Plan Note (Signed)
Doing well on PRN use of omeprazole 20 mg, continue same.

## 2021-05-15 NOTE — Assessment & Plan Note (Signed)
Influenza vaccine provided today. Pap smear due, completed today. Mammogram due, orders placed. Colonoscopy UTD, due Summer 2023, discussed with patient today.  Discussed the importance of a healthy diet and regular exercise in order for weight loss, and to reduce the risk of further co-morbidity.  Exam today stable. Labs pending and reviewed.

## 2021-05-19 ENCOUNTER — Emergency Department (HOSPITAL_COMMUNITY): Payer: BC Managed Care – PPO

## 2021-05-19 ENCOUNTER — Encounter (HOSPITAL_COMMUNITY): Payer: Self-pay

## 2021-05-19 ENCOUNTER — Other Ambulatory Visit: Payer: Self-pay

## 2021-05-19 ENCOUNTER — Emergency Department (HOSPITAL_COMMUNITY)
Admission: EM | Admit: 2021-05-19 | Discharge: 2021-05-19 | Disposition: A | Discharge status: Home / Self Care | Payer: BC Managed Care – PPO | Attending: Emergency Medicine | Admitting: Emergency Medicine

## 2021-05-19 DIAGNOSIS — Z7901 Long term (current) use of anticoagulants: Secondary | ICD-10-CM | POA: Insufficient documentation

## 2021-05-19 DIAGNOSIS — Z87891 Personal history of nicotine dependence: Secondary | ICD-10-CM | POA: Diagnosis not present

## 2021-05-19 DIAGNOSIS — Z8551 Personal history of malignant neoplasm of bladder: Secondary | ICD-10-CM | POA: Diagnosis not present

## 2021-05-19 DIAGNOSIS — R519 Headache, unspecified: Secondary | ICD-10-CM | POA: Diagnosis present

## 2021-05-19 DIAGNOSIS — J329 Chronic sinusitis, unspecified: Secondary | ICD-10-CM | POA: Insufficient documentation

## 2021-05-19 MED ORDER — HYDROMORPHONE HCL 1 MG/ML IJ SOLN
0.5000 mg | Freq: Once | INTRAMUSCULAR | Status: AC
  Administered 2021-05-19: 14:00:00 0.5 mg via INTRAMUSCULAR
  Filled 2021-05-19: qty 1

## 2021-05-19 MED ORDER — LIDOCAINE 5 % EX OINT: 1.0000 "application " | TOPICAL_OINTMENT | CUTANEOUS | 0 refills | Status: DC | PRN

## 2021-05-19 NOTE — ED Provider Notes (Signed)
Emergency Medicine Provider Triage Evaluation Note  [name] , a 60 y.o. female  was evaluated in triage.  Pt complains of headache.  She has a history of trigeminal neuralgia and has been taking gabapentin for her symptoms, however she has been trying to wean herself off of it because she has not had an episode in over a year.  However, states that few hours prior to arrival she had an attack that feels very similar to her normal trigeminal neuralgia pain but worse than normal.  States that pain is like a shock wave through the right side of her neck and face.  Denies fevers, chills.  Review of Systems  Positive:  Negative: See above  Physical Exam  BP (!) 155/86 (BP Location: Right Arm)    Pulse 61    Temp 98.6 F (37 C) (Oral)    Resp 18    SpO2 100%  Gen:   Awake, no distress   Resp:  Normal effort  MSK:   Moves extremities without difficulty Other:  Swelling noted to the left side of the face, alert and oriented and neurologically intact  Medical Decision Making  Medically screening exam initiated at 1:01 PM.  Appropriate orders placed.  Theresa Lewis was informed that the remainder of the evaluation will be completed by another provider, this initial triage assessment does not replace that evaluation, and the importance of remaining in the ED until their evaluation is complete.     Theresa Lewis 05/19/21 1304    Theresa Pence, MD 05/19/21 1320

## 2021-05-19 NOTE — ED Provider Notes (Signed)
[facility] EMERGENCY DEPARTMENT Provider Note   CSN: [id number] Arrival date & time: 05/19/21  1212     History Chief Complaint  Patient presents with   Trigeminal Neuralgia    Theresa Lewis is a 60 y.o. female presents emergency department for a trigeminal neuralgia flareup.  Patient reports that she has not had a flareup in over a year and was weaned off of her gabapentin 6 months ago due to her lack of flareups.  She reports that today around 1130 she had a flareup while in the car.  She already took 300 mg gabapentin that mildly reduce her symptoms.  Denies any unilateral weakness, dental pain, ear pain, runny nose, nasal congestion, fevers.  Patient reports this feels like her typical range of neuralgia pain only a little worse.  Medical history positive for active bladder cancer.  On warfarin.  Allergic to clindamycin.  Former smoker.  HPI     Past Medical History:  Diagnosis Date   ABNORMAL VAGINAL BLEEDING 04/19/2007   Qualifier: Diagnosis of  By: Hulan Saas, CMA (AAMA), Larene Beach S    Acute cystitis without hematuria 01/31/2020   Anemia    h/o as a child   Cancer ([place])    Complication of anesthesia    stays asleep for a "very long time"   Deep venous thrombosis ([place])    2006    3 x clotting    GERD (gastroesophageal reflux disease)    HEMORRHOIDS, WITH BLEEDING 12/26/2008   Qualifier: Diagnosis of  By: Regis Bill MD, Standley Brooking    PE (pulmonary embolism) 07/09/2010   Pre-diabetes    Pulmonary embolism [facility])     Patient Active Problem List   Diagnosis Date Noted   Preoperative clearance 11/15/2020   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   Bladder tumor 04/17/2020   Acute extremity pain 11/04/2019   Bleeding internal hemorrhoids 04/08/2018   Gastroesophageal reflux disease 04/08/2018   Long term (current) use of anticoagulants 02/27/2017   Joint swelling 04/25/2016   Trigeminal neuralgia 09/19/2015   Preventative health care 02/15/2015    Borderline diabetes 02/15/2015   Dysphagia 11/16/2014   Encounter for therapeutic drug monitoring 07/11/2013   Visit for gynecologic examination 07/08/2012   BMI 36.0-36.9,adult 07/08/2012   Skin tag of vulva  multiple  07/05/2012   Anticoagulant long-term use 03/22/2012   Antiphospholipid antibody syndrome ([place]) 07/09/2010   EDEMA 10/04/2007   Personal history of venous thrombosis and embolism 11/12/2006    Past Surgical History:  Procedure Laterality Date   MYOMECTOMY     rectal tear     TRANSURETHRAL RESECTION OF BLADDER TUMOR N/A 04/24/2020   Procedure: TRANSURETHRAL RESECTION OF BLADDER TUMOR (TURBT);  Surgeon: Abbie Sons, MD;  Location: [facility] ORS;  Service: Urology;  Laterality: N/A;   TRANSURETHRAL RESECTION OF BLADDER TUMOR N/A 05/15/2020   Procedure: TRANSURETHRAL RESECTION OF BLADDER TUMOR (TURBT);  Surgeon: Abbie Sons, MD;  Location: [facility] ORS;  Service: Urology;  Laterality: N/A;   TRANSURETHRAL RESECTION OF BLADDER TUMOR N/A 12/04/2020   Procedure: TRANSURETHRAL RESECTION OF BLADDER TUMOR (TURBT);  Surgeon: Abbie Sons, MD;  Location: [facility] ORS;  Service: Urology;  Laterality: N/A;   TUBAL LIGATION     vena caval filter       OB History   No obstetric history on file.     Family History  Problem Relation Age of Onset   Lung cancer Father 73       april 12  Pancreatic cancer Father 39   Hypertension Mother    Rheum arthritis Mother    Diabetes Paternal Aunt        x2   Colon cancer Neg Hx    Colon polyps Neg Hx    Kidney disease Neg Hx    Esophageal cancer Neg Hx    Gallbladder disease Neg Hx    Rectal cancer Neg Hx    Stomach cancer Neg Hx     Social History   Tobacco Use   Smoking status: Former    Years: 4.00    Types: Cigarettes   Smokeless tobacco: Never  Vaping Use   Vaping Use: Never used  Substance Use Topics   Alcohol use: Not Currently    Comment: rarely   Drug use: No    Home Medications Prior to Admission  medications   Medication Sig Start Date End Date Taking? Authorizing Provider  gabapentin (NEURONTIN) 100 MG capsule Take 1 capsule by mouth three times weekly as needed. 07/28/19   Pleas Koch, NP  mirabegron ER (MYRBETRIQ) 25 MG TB24 tablet Take 1 tablet (25 mg total) by mouth daily. 03/29/21   Vaillancourt, Aldona Bar, PA-C  omeprazole (PRILOSEC) 20 MG capsule Take 1 capsule by mouth three times weekly for heartburn. 04/21/21   Pleas Koch, NP  polyethylene glycol (MIRALAX / GLYCOLAX) 17 g packet Take 17 g by mouth daily as needed for moderate constipation.    [provider]  warfarin (COUMADIN) 5 MG tablet TAKE 1 & 1/2 TABLETS BY MOUTH DAILY EXCEPT TAKE 2 TABLETS ON Monday and Thursday, OR AS DIRECTED BY COUMADIN CLINIC. 03/27/21   Pleas Koch, NP    Allergies    Clindamycin/lincomycin and Tape  Review of Systems   Review of Systems  Constitutional:  Negative for chills and fever.  HENT:  Negative for congestion, dental problem, ear discharge, ear pain, nosebleeds, rhinorrhea, sinus pressure, sinus pain, sore throat and tinnitus.        Reports facial pain  Eyes:  Negative for pain and visual disturbance.  Respiratory:  Negative for cough and shortness of breath.   Cardiovascular:  Negative for chest pain and palpitations.  Gastrointestinal:  Negative for abdominal pain, nausea and vomiting.  Genitourinary:  Negative for dysuria and hematuria.  Musculoskeletal:  Negative for arthralgias and back pain.  Skin:  Negative for color change and rash.  Neurological:  Negative for dizziness, seizures, syncope and headaches.  All other systems reviewed and are negative.  Physical Exam Updated Vital Signs BP (!) 155/86 (BP Location: Right Arm)    Pulse 61    Temp 98.6 F (37 C) (Oral)    Resp 18    SpO2 100%   Physical Exam Vitals and nursing note reviewed.  Constitutional:      General: She is not in acute distress.    Appearance: Normal appearance. She is not  toxic-appearing.  HENT:     Head: Normocephalic and atraumatic.     Right Ear: Tympanic membrane, ear canal and external ear normal.     Left Ear: Tympanic membrane, ear canal and external ear normal.     Nose: Nose normal.     Mouth/Throat:     Mouth: Mucous membranes are moist.  Eyes:     General: No scleral icterus. Pulmonary:     Effort: Pulmonary effort is normal. No respiratory distress.  Musculoskeletal:     Cervical back: Normal range of motion.  Skin:    General: Skin  is dry.     Findings: No rash.  Neurological:     General: No focal deficit present.     Mental Status: She is alert. Mental status is at baseline.     Cranial Nerves: No cranial nerve deficit.     Gait: Gait normal.  Psychiatric:        Mood and Affect: Mood normal.    ED Results / Procedures / Treatments   Labs (all labs ordered are listed, but only abnormal results are displayed) Labs Reviewed - No data to display  EKG None  Radiology CT Head Wo Contrast  Result Date: 05/19/2021 CLINICAL DATA:  Headaches EXAM: CT HEAD WITHOUT CONTRAST TECHNIQUE: Contiguous axial images were obtained from the base of the skull through the vertex without intravenous contrast. COMPARISON:  None. FINDINGS: Brain: No acute intracranial findings are seen. There are no signs of bleeding within the cranium. Ventricles are not dilated. There is no shift of midline structures. Cortical sulci are prominent. Vascular: There are scattered arterial calcifications. Skull: Hyperostosis frontalis interna is seen. Sinuses/Orbits: There is mucosal thickening in the ethmoid, sphenoid and maxillary sinuses. Other: There is increased amount of CSF insula suggesting partial empty sella. IMPRESSION: No acute intracranial findings are seen in noncontrast CT brain. Chronic sinusitis. Electronically Signed   By: Elmer Picker M.D.   On: 05/19/2021 13:24    Procedures Procedures   Medications Ordered in ED Medications  HYDROmorphone  (DILAUDID) injection 0.5 mg (has no administration in time range)    ED Course  I have reviewed the triage vital signs and the nursing notes.  Pertinent labs & imaging results that were available during my care of the patient were reviewed by me and considered in my medical decision making (see chart for details).  60 year old female presents the emergency department for evaluation of left-sided facial pain onset today.  CT head obtained to rule out stroke.  No acute intracranial findings seen on noncontrast CT brain.  The patient's cranial nerves II through XII are grossly intact.  No pronator drift.  Strength 5 out of 5 in bilateral upper and lower extremities.  Sensation intact compartment soft.  Low suspicion for stroke at this time.  There is no ear, nose, mouth, or dental abnormal exam finding.  This is likely her trigeminal neuralgia.  Vital signs show mild hypertension, although patient is in pain.  Afebrile.  Normal heart rate.  Satting 100% on room air.  We will give patient dose of Dilaudid here for pain.  On reevaluation, patient reports her pain has gone down significantly and she is feeling much better.  Is with her the normal CT findings.  Recommended she follow-up with her neurologist for further treatment and reevaluation of his condition.  I advised her to restart taking her gabapentin and to progress to 200 mg of gabapentin 3 times daily after a few days.  Prescribed topical lidocaine for her to apply to the pinpoint area as needed.  Strict return precautions given.  Patient agrees to plan.  Patient is stable being discharged home in good condition.  I discussed this case with my attending physician who cosigned this note including patient's presenting symptoms, physical exam, and planned diagnostics and interventions. Attending physician stated agreement with plan or made changes to plan which were implemented.   Attending physician assessed patient at bedside.   MDM  Rules/Calculators/A&P  Final Clinical Impression(s) / ED Diagnoses Final diagnoses:  Facial pain    Rx / DC Orders ED Discharge Orders          Ordered    lidocaine (XYLOCAINE) 5 % ointment  As needed        05/19/21 1411             Sherrell Puller, [place] 05/19/21 1421    Dorie Rank, MD 05/22/21 1336

## 2021-05-19 NOTE — ED Triage Notes (Addendum)
Pt arrived POV c/o trigeminal neuralgia. Pt states her nerve in her head is starting to swell. Pt states her head is killing her. Pt states the pain is 10/10. Pt states she has had the pain before but this is worse than before.

## 2021-05-19 NOTE — Discharge Instructions (Addendum)
Today for evaluation of your left-sided facial pain.  This is likely your trigeminal neuralgia flareup.  You can increase your gabapentin to 200 mg 3 times daily instead of 100 mg 3 times daily.  Follow-up with care primary care or neurologist, whoever handles return terminal neuralgia, for reevaluation and further treatment.  If you have any weakness, slurring speech, numbness, tingling, blurry vision, please return to the nearest emergency department for reevaluation.

## 2021-05-24 ENCOUNTER — Encounter: Payer: Self-pay | Admitting: Urology

## 2021-05-24 ENCOUNTER — Ambulatory Visit: Payer: BC Managed Care – PPO | Admitting: Urology

## 2021-05-24 ENCOUNTER — Other Ambulatory Visit: Payer: Self-pay

## 2021-05-24 VITALS — BP 120/77 | HR 61 | Ht 67.0 in | Wt 213.0 lb

## 2021-05-24 DIAGNOSIS — C679 Malignant neoplasm of bladder, unspecified: Secondary | ICD-10-CM

## 2021-05-24 LAB — MICROSCOPIC EXAMINATION
Bacteria, UA: NONE SEEN
Epithelial Cells (non renal): >10 /hpf — AB (ref 0–10)

## 2021-05-24 LAB — URINALYSIS, COMPLETE
Bilirubin, UA: NEGATIVE
Glucose, UA: NEGATIVE
Ketones, UA: NEGATIVE
Leukocytes,UA: NEGATIVE
Nitrite, UA: NEGATIVE
RBC, UA: NEGATIVE
Specific Gravity, UA: >1.03 — ABNORMAL HIGH (ref 1.005–1.030)
Urobilinogen, Ur: 0.2 mg/dL (ref 0.2–1.0)
pH, UA: 5.5 (ref 5.0–7.5)

## 2021-05-24 NOTE — Progress Notes (Signed)
° °  05/24/21  CC:  Chief Complaint  Patient presents with   Cysto    Urologic history:  1.T1 high grade urothelial carcinoma bladder TURBT 03/2020 >6 cm papillary bladder tumor Path T1 high-grade urothelial carcinoma Restaging TURBT 04/2020; no residual tumor 6 week induction BCG completed March 2022 Multifocal recurrence cystoscopy 10/31/2020 TURBT 11/2020 Ta high-grade UCa Reinduction BCG completed 02/22/2021  HPI: 60 y.o. female presents for for cystoscopy after completion reinduction BCG.  OAB symptoms which are stable on Myrbetriq.  Denies gross hematuria  Blood pressure 120/77, pulse 61, height 5\' 7"  (1.702 m),   Cystoscopy Procedure Note  Patient identification was confirmed, informed consent was obtained, and patient was prepped using Betadine solution.  Lidocaine jelly was administered per urethral meatus.    Procedure: - Flexible cystoscope introduced, without any difficulty.   - Thorough search of the bladder revealed:    normal urethral meatus    no solid or papillary lesions    Minimal erythema most likely BCG effect    no stones    no ulcers     no tumors    no urethral polyps    no trabeculation  - Ureteral orifices were normal in position and appearance.  Post-Procedure: - Patient tolerated the procedure well  Assessment/ Plan: No recurrent bladder tumor Urine cytology sent and if negative recommend starting maintenance BCG    Abbie Sons, MD

## 2021-05-26 LAB — CYTOLOGY - PAP
Adequacy: ABSENT
Comment: NEGATIVE
Diagnosis: UNDETERMINED — AB
High risk HPV: NEGATIVE

## 2021-05-28 ENCOUNTER — Ambulatory Visit: Payer: BC Managed Care – PPO | Attending: Primary Care

## 2021-05-28 LAB — CYTOLOGY - NON PAP

## 2021-05-29 ENCOUNTER — Encounter: Payer: Self-pay | Admitting: *Deleted

## 2021-06-07 ENCOUNTER — Other Ambulatory Visit: Payer: Self-pay

## 2021-06-07 ENCOUNTER — Ambulatory Visit: Payer: BC Managed Care – PPO

## 2021-06-07 DIAGNOSIS — Z7901 Long term (current) use of anticoagulants: Secondary | ICD-10-CM

## 2021-06-07 LAB — POCT INR: INR: 1.4 — AB (ref 2.0–3.0)

## 2021-06-07 NOTE — Patient Instructions (Addendum)
Pre visit review using our clinic review tool, if applicable. No additional management support is needed unless otherwise documented below in the visit note.  Increase dose today and tomorrow to 12.5 mg and then change weekly dose to take 7.5 mg daily except take 10 mg on Mon, Wed and Fri.. Recheck in 2 weeks.

## 2021-06-07 NOTE — Progress Notes (Signed)
Increase dose today and tomorrow to 12.5 mg and then change weekly dose to take 7.5 mg daily except take 10 mg on Mon, Wed and Fri.. Recheck in 2 weeks.

## 2021-06-13 ENCOUNTER — Other Ambulatory Visit: Payer: Self-pay | Admitting: Primary Care

## 2021-06-13 DIAGNOSIS — Z86718 Personal history of other venous thrombosis and embolism: Secondary | ICD-10-CM

## 2021-06-13 DIAGNOSIS — D6869 Other thrombophilia: Secondary | ICD-10-CM

## 2021-06-21 ENCOUNTER — Ambulatory Visit: Payer: BC Managed Care – PPO

## 2021-06-21 ENCOUNTER — Other Ambulatory Visit: Payer: Self-pay

## 2021-06-21 DIAGNOSIS — Z7901 Long term (current) use of anticoagulants: Secondary | ICD-10-CM

## 2021-06-21 LAB — POCT INR: INR: 3.3 — AB (ref 2.0–3.0)

## 2021-06-21 NOTE — Progress Notes (Signed)
Continue 7.5 mg daily except take 10 mg on Mon, Wed and Fri.. Recheck in 4 weeks.

## 2021-06-21 NOTE — Patient Instructions (Addendum)
Pre visit review using our clinic review tool, if applicable. No additional management support is needed unless otherwise documented below in the visit note.  Continue 7.5 mg daily except take 10 mg on Mon, Wed and Fri.. Recheck in 4 weeks.

## 2021-07-10 ENCOUNTER — Other Ambulatory Visit: Payer: Self-pay | Admitting: Primary Care

## 2021-07-10 DIAGNOSIS — K219 Gastro-esophageal reflux disease without esophagitis: Secondary | ICD-10-CM

## 2021-07-19 ENCOUNTER — Other Ambulatory Visit: Payer: Self-pay

## 2021-07-19 ENCOUNTER — Ambulatory Visit: Payer: BC Managed Care – PPO

## 2021-07-19 DIAGNOSIS — Z7901 Long term (current) use of anticoagulants: Secondary | ICD-10-CM

## 2021-07-19 LAB — POCT INR: INR: 4.8 — AB (ref 2.0–3.0)

## 2021-07-19 NOTE — Patient Instructions (Addendum)
Pre visit review using our clinic review tool, if applicable. No additional management support is needed unless otherwise documented below in the visit note.  Hold dose today and decrease dose tomorrow to take 1 tablet only and then change weekly dose to take 1 1/2 tablets  daily except take 1 tablet on Wednesdays. Recheck in 2 weeks.

## 2021-07-19 NOTE — Progress Notes (Signed)
Hold dose today and decrease dose tomorrow to take 1 tablet only and then change weekly dose to take 1 1/2 tablets  daily except take 1 tablet on Wednesdays. Recheck in 2 weeks.

## 2021-07-24 ENCOUNTER — Ambulatory Visit
Admission: RE | Admit: 2021-07-24 | Discharge: 2021-07-24 | Disposition: A | Discharge status: Home / Self Care | Payer: BC Managed Care – PPO | Source: Ambulatory Visit | Attending: Primary Care | Admitting: Primary Care

## 2021-07-24 ENCOUNTER — Other Ambulatory Visit: Payer: Self-pay

## 2021-07-24 DIAGNOSIS — Z1231 Encounter for screening mammogram for malignant neoplasm of breast: Secondary | ICD-10-CM | POA: Diagnosis not present

## 2021-08-01 ENCOUNTER — Telehealth: Payer: Self-pay | Admitting: Primary Care

## 2021-08-01 ENCOUNTER — Ambulatory Visit: Payer: BC Managed Care – PPO

## 2021-08-01 NOTE — Telephone Encounter (Signed)
RS pt for 3/16 per pt request. Offered pt apt this afternoon but she is not able to make apt this afternoon due to work.  ?

## 2021-08-01 NOTE — Telephone Encounter (Signed)
Patient called, needs to reschedule her INR for today ? ?Please call the patient at [phone] ?

## 2021-08-08 ENCOUNTER — Ambulatory Visit: Payer: BC Managed Care – PPO

## 2021-08-08 ENCOUNTER — Other Ambulatory Visit: Payer: Self-pay

## 2021-08-08 DIAGNOSIS — Z7901 Long term (current) use of anticoagulants: Secondary | ICD-10-CM | POA: Diagnosis not present

## 2021-08-08 LAB — POCT INR: INR: 4.3 — AB (ref 2.0–3.0)

## 2021-08-08 NOTE — Patient Instructions (Addendum)
Pre visit review using our clinic review tool, if applicable. No additional management support is needed unless otherwise documented below in the visit note. ? ?Hold dose today and then change weekly dose to take 1 1/2 tablets daily except take 1 tablet on Mondays, Wednesdays and Fridays.  Recheck in 3 weeks.  ?

## 2021-08-08 NOTE — Progress Notes (Signed)
Hold dose today and then change weekly dose to take 1 1/2 tablets daily except take 1 tablet on Mondays, Wednesdays and Fridays.  Recheck in 3 weeks.  ?

## 2021-08-29 ENCOUNTER — Telehealth: Payer: Self-pay

## 2021-08-29 ENCOUNTER — Ambulatory Visit (INDEPENDENT_AMBULATORY_CARE_PROVIDER_SITE_OTHER): Payer: BC Managed Care – PPO

## 2021-08-29 DIAGNOSIS — Z7901 Long term (current) use of anticoagulants: Secondary | ICD-10-CM

## 2021-08-29 LAB — POCT INR: INR: 1.5 — AB (ref 2.0–3.0)

## 2021-08-29 NOTE — Patient Instructions (Addendum)
Pre visit review using our clinic review tool, if applicable. No additional management support is needed unless otherwise documented below in the visit note. ? ?Increase dose today to take 2 tablets and then change weekly dose to take 1 tablet daily except take 2 tablets on Mondays, Wednesdays and Fridays. Recheck in 1 week. ?

## 2021-08-29 NOTE — Progress Notes (Addendum)
Increase dose today to take 2 tablets and then change weekly dose to take 1 tablet daily except take 2 tablets on Mondays, Wednesdays and Fridays. Recheck in 1 week. ?

## 2021-08-29 NOTE — Telephone Encounter (Signed)
Incoming call on triage line from pt in regards to BCG. Pt states she was to have a "few months off" but then should be starting mBCG. Pt would like to know when she will be starting. Advised pt I would send a message to Dr Bernardo Heater and give her a call back next week.  ?

## 2021-09-01 NOTE — Telephone Encounter (Signed)
Sched cystoscopy and 3 week mBCG if cysto neg ?

## 2021-09-03 ENCOUNTER — Ambulatory Visit (INDEPENDENT_AMBULATORY_CARE_PROVIDER_SITE_OTHER): Payer: BC Managed Care – PPO | Admitting: Primary Care

## 2021-09-03 ENCOUNTER — Ambulatory Visit (INDEPENDENT_AMBULATORY_CARE_PROVIDER_SITE_OTHER)
Admission: RE | Admit: 2021-09-03 | Discharge: 2021-09-03 | Disposition: A | Discharge status: Home / Self Care | Payer: BC Managed Care – PPO | Source: Ambulatory Visit | Attending: Primary Care | Admitting: Primary Care

## 2021-09-03 ENCOUNTER — Encounter: Payer: Self-pay | Admitting: Primary Care

## 2021-09-03 VITALS — BP 124/72 | HR 62 | Ht 65.0 in | Wt 216.0 lb

## 2021-09-03 DIAGNOSIS — M25561 Pain in right knee: Secondary | ICD-10-CM

## 2021-09-03 DIAGNOSIS — G8929 Other chronic pain: Secondary | ICD-10-CM | POA: Insufficient documentation

## 2021-09-03 NOTE — Patient Instructions (Signed)
Stop by the lab and xray prior to leaving today. I will notify you of your results once received.  ? ?You can apply diclofenac (Voltaren) gel to your knee 3 times daily as needed for pain and inflammation. ? ?You may continue Tylenol as needed. ? ?It was a pleasure to see you today! ? ?

## 2021-09-03 NOTE — Assessment & Plan Note (Signed)
Differentials include osteoarthritis, gout. ? ?Lower likelihood of rheumatoid arthritis.  ? ?Checking uric acid level today. ?Plain films of the right knee pending. ? ?We discussed use of diclofenac gel and Tylenol as needed. ?Await results. ?

## 2021-09-03 NOTE — Telephone Encounter (Signed)
Patient scheduled for cysto. Pt confirmed.  ?

## 2021-09-03 NOTE — Progress Notes (Signed)
? ?Subjective:  ? ? Patient ID: Theresa Lewis, female    DOB: January 15, 1961, 61 y.o.   MRN: [id number] ? ?HPI ? ?Theresa Lewis is a very pleasant 61 y.o. female with a history of bladder cancer, antiphospholipid antibody syndrome, prediabetes, obesity who presents today to discuss knee pain. ? ?Her pain is located to the anterior right knee which began about 6 weeks ago. She's also noticed intermittent swelling to the knee for the last one year. Her swelling has been without pain.  ? ?She describes her pain as a constant and ache, walks with a limp. Her pain is worse when rising after sitting for prolonged periods of time. ? ?She denies redness, erythema, injury, trauma, joint aches elsewhere.   ? ?She works from home, sits throughout most of her work day. She's been taking Tylenol with temporary improvement.  ? ? ?Review of Systems  ?Musculoskeletal:  Positive for arthralgias and joint swelling.  ?Skin:  Negative for color change.  ? ?   ? ? ?Past Medical History:  ?Diagnosis Date  ? ABNORMAL VAGINAL BLEEDING 04/19/2007  ? Qualifier: Diagnosis of  By: Hulan Saas, CMA (AAMA), [place]e   ? Acute cystitis without hematuria 01/31/2020  ? Anemia   ? h/o as a child  ? Cancer [facility])   ? Complication of anesthesia   ? stays asleep for a "very long time"  ? Deep venous thrombosis ([place])   ? 2006    3 x clotting   ? GERD (gastroesophageal reflux disease)   ? HEMORRHOIDS, WITH BLEEDING 12/26/2008  ? Qualifier: Diagnosis of  By: Regis Bill MD, Standley Brooking   ? PE (pulmonary embolism) 07/09/2010  ? Pre-diabetes   ? Pulmonary embolism ([place])   ? ? ?Social History  ? ?Socioeconomic History  ? Marital status: Married  ?  Spouse name: Not on file  ? Number of children: 1  ? Years of education: Not on file  ? Highest education level: Not on file  ?Occupational History  ? Occupation: student advocate   ?Tobacco Use  ? Smoking status: Former  ?  Years: 4.00  ?  Types: Cigarettes  ? Smokeless tobacco: Never  ?Vaping Use  ? Vaping Use: Never used   ?Substance and Sexual Activity  ? Alcohol use: Not Currently  ?  Comment: rarely  ? Drug use: No  ? Sexual activity: Not on file  ?Other Topics Concern  ? Not on file  ?Social History Narrative  ? HH of 3  Daughter 34 and self.  hh of 2   ? No pets.  ? No ets.  ? No etoh.   ? Working for [employer] and [employer] .  45- 50 hours per week.   ?   ?   ?   ?   ? ?Social Determinants of Health  ? ?Financial Resource Strain: Not on file  ?Food Insecurity: Not on file  ?Transportation Needs: Not on file  ?Physical Activity: Not on file  ?Stress: Not on file  ?Social Connections: Not on file  ?Intimate Partner Violence: Not on file  ? ? ?Past Surgical History:  ?Procedure Laterality Date  ? MYOMECTOMY    ? rectal tear    ? TRANSURETHRAL RESECTION OF BLADDER TUMOR N/A 04/24/2020  ? Procedure: TRANSURETHRAL RESECTION OF BLADDER TUMOR (TURBT);  Surgeon: Abbie Sons, MD;  Location: [facility] ORS;  Service: Urology;  Laterality: N/A;  ? TRANSURETHRAL RESECTION OF BLADDER TUMOR N/A 05/15/2020  ? Procedure: TRANSURETHRAL RESECTION OF BLADDER  TUMOR (TURBT);  Surgeon: Abbie Sons, MD;  Location: [facility] ORS;  Service: Urology;  Laterality: N/A;  ? TRANSURETHRAL RESECTION OF BLADDER TUMOR N/A 12/04/2020  ? Procedure: TRANSURETHRAL RESECTION OF BLADDER TUMOR (TURBT);  Surgeon: Abbie Sons, MD;  Location: [facility] ORS;  Service: Urology;  Laterality: N/A;  ? TUBAL LIGATION    ? vena caval filter    ? ? ?Family History  ?Problem Relation Age of Onset  ? Hypertension Mother   ? Rheum arthritis Mother   ? Lung cancer Father 33  ?     april 12   ? Pancreatic cancer Father 76  ? Diabetes Paternal Aunt   ?     x2  ? Colon cancer Neg Hx   ? Colon polyps Neg Hx   ? Kidney disease Neg Hx   ? Esophageal cancer Neg Hx   ? Gallbladder disease Neg Hx   ? Rectal cancer Neg Hx   ? Stomach cancer Neg Hx   ? Breast cancer Neg Hx   ? ? ?Allergies  ?Allergen Reactions  ? Clindamycin/Lincomycin   ?  "unknown"  ? Tape Other (See Comments)  ?  When pt has a  band-aid on, the impression of band-aid stays on for a month  ? ? ?Current Outpatient Medications on File Prior to Visit  ?Medication Sig Dispense Refill  ? gabapentin (NEURONTIN) 100 MG capsule Take 1 capsule by mouth three times weekly as needed. 90 capsule 0  ? mirabegron ER (MYRBETRIQ) 25 MG TB24 tablet Take 1 tablet (25 mg total) by mouth daily. 56 tablet 0  ? omeprazole (PRILOSEC) 20 MG capsule TAKE 1 CAPSULE BY MOUTH THREE TIMES WEEKLY FOR HEARTBURN. 36 capsule 3  ? polyethylene glycol (MIRALAX / GLYCOLAX) 17 g packet Take 17 g by mouth daily as needed for moderate constipation.    ? warfarin (COUMADIN) 5 MG tablet Take 2 tablets by mouth on Monday, Wednesday, Friday. Take 1.5 tablets by mouth all other days. OR AS DIRECTED BY COUMADIN CLINIC. 145 tablet 1  ? lidocaine (XYLOCAINE) 5 % ointment Apply 1 application topically as needed. (Patient not taking: Reported on 09/03/2021) 35.44 g 0  ? ?No current facility-administered medications on file prior to visit.  ? ? ?BP 124/72   Pulse 62   Ht '5\' 5"'$  (1.651 m)   Wt 216 lb (98 kg)   SpO2 95%   BMI 35.94 kg/m?  ?Objective:  ? Physical Exam ?Constitutional:   ?   General: She is not in acute distress. ?Musculoskeletal:  ?   Right knee: No bony tenderness. Decreased range of motion. Tenderness present over the medial joint line and lateral joint line.  ?     Legs: ? ?   Comments: 5/5 strength to bilateral lower extremities.  ?Mild swelling noted to right anterior patellar region  ?Skin: ?   General: Skin is warm and dry.  ?   Findings: No erythema.  ?Neurological:  ?   Mental Status: She is alert.  ? ? ? ? ? ?   ?Assessment & Plan:  ? ? ? ? ?This visit occurred during the SARS-CoV-2 public health emergency.  Safety protocols were in place, including screening questions prior to the visit, additional usage of staff PPE, and extensive cleaning of exam room while observing appropriate contact time as indicated for disinfecting solutions.  ?

## 2021-09-04 LAB — URIC ACID: Uric Acid, Serum: 4.2 mg/dL (ref 2.4–7.0)

## 2021-09-05 ENCOUNTER — Ambulatory Visit (INDEPENDENT_AMBULATORY_CARE_PROVIDER_SITE_OTHER): Payer: BC Managed Care – PPO

## 2021-09-05 DIAGNOSIS — Z7901 Long term (current) use of anticoagulants: Secondary | ICD-10-CM

## 2021-09-05 LAB — POCT INR: INR: 3.4 — AB (ref 2.0–3.0)

## 2021-09-05 NOTE — Patient Instructions (Addendum)
Pre visit review using our clinic review tool, if applicable. No additional management support is needed unless otherwise documented below in the visit note. ? ?Continue 1 tablet daily except take 2 tablets on Mondays, Wednesdays and Fridays. Recheck in 4 week. ?

## 2021-09-05 NOTE — Progress Notes (Signed)
Continue 1 tablet daily except take 2 tablets on Mondays, Wednesdays and Fridays. Recheck in 4 week. ?

## 2021-09-13 ENCOUNTER — Encounter: Payer: Self-pay | Admitting: Urology

## 2021-09-13 ENCOUNTER — Ambulatory Visit: Payer: BC Managed Care – PPO | Admitting: Urology

## 2021-09-13 VITALS — BP 128/84 | HR 60 | Ht 65.0 in | Wt 216.0 lb

## 2021-09-13 DIAGNOSIS — Z8551 Personal history of malignant neoplasm of bladder: Secondary | ICD-10-CM | POA: Diagnosis not present

## 2021-09-13 DIAGNOSIS — C679 Malignant neoplasm of bladder, unspecified: Secondary | ICD-10-CM

## 2021-09-13 LAB — URINALYSIS, COMPLETE
Bilirubin, UA: NEGATIVE
Glucose, UA: NEGATIVE
Ketones, UA: NEGATIVE
Leukocytes,UA: NEGATIVE
Nitrite, UA: NEGATIVE
Protein,UA: NEGATIVE
RBC, UA: NEGATIVE
Specific Gravity, UA: 1.02 (ref 1.005–1.030)
Urobilinogen, Ur: 0.2 mg/dL (ref 0.2–1.0)
pH, UA: 7 (ref 5.0–7.5)

## 2021-09-13 LAB — MICROSCOPIC EXAMINATION: Bacteria, UA: NONE SEEN

## 2021-09-13 NOTE — Progress Notes (Signed)
? ?  09/13/21 ? ?CC:  ?Chief Complaint  ?Patient presents with  ? Cysto  ? ? ?Urologic history: ? ?1.T1 high grade urothelial carcinoma bladder ?TURBT 03/2020 >6 cm papillary bladder tumor ?Path T1 high-grade urothelial carcinoma ?Restaging TURBT 04/2020; no residual tumor ?6 week induction BCG completed March 2022 ?Multifocal recurrence cystoscopy 10/31/2020 ?TURBT 11/2020 Ta high-grade UCa ?Reinduction BCG completed 02/22/2021 ? ?HPI: 61 y.o. female presents for surveillance cystoscopy.  She has no complaints ?Blood pressure 120/77, pulse 61, height '5\' 7"'$  (1.702 m),  ? ?Cystoscopy Procedure Note ? ?Patient identification was confirmed, informed consent was obtained, and patient was prepped using Betadine solution.  Lidocaine jelly was administered per urethral meatus.   ? ?Procedure: ?- Flexible cystoscope introduced, without any difficulty.   ?- Thorough search of the bladder revealed: ?   normal urethral meatus ?   no solid or papillary lesions ?   Mild posterior wall erythema ?   no stones ?   no ulcers  ?   no tumors ?   no urethral polyps ?   no trabeculation ? ?- Ureteral orifices were normal in position and appearance. ? ?Post-Procedure: ?- Patient tolerated the procedure well ? ?Assessment/ Plan: ?No recurrent bladder tumor ?Mild posterior wall erythema ?Urine cytology sent and if negative she desires to start maintenance BCG ?Surveillance cystoscopy 3 months ? ? ? ?Abbie Sons, MD ? ?

## 2021-09-16 LAB — CYTOLOGY - NON PAP

## 2021-09-24 ENCOUNTER — Telehealth: Payer: Self-pay | Admitting: *Deleted

## 2021-09-24 NOTE — Telephone Encounter (Signed)
-----   Message from Abbie Sons, MD sent at 09/24/2021  3:05 PM EDT ----- ?Urine cytology showed no cell suspicious for cancer.  Keep scheduled follow-up appointment ?

## 2021-09-24 NOTE — Telephone Encounter (Signed)
Notified patient as instructed, patient pleased. Discussed follow-up appointments, patient agrees  

## 2021-09-29 ENCOUNTER — Telehealth: Payer: Self-pay | Admitting: Urology

## 2021-09-29 NOTE — Telephone Encounter (Signed)
Please sched mBCG X3 ?

## 2021-10-03 ENCOUNTER — Ambulatory Visit: Payer: BC Managed Care – PPO

## 2021-10-03 DIAGNOSIS — Z7901 Long term (current) use of anticoagulants: Secondary | ICD-10-CM

## 2021-10-03 LAB — POCT INR: INR: 5.4 — AB (ref 2.0–3.0)

## 2021-10-03 NOTE — Patient Instructions (Addendum)
Pre visit review using our clinic review tool, if applicable. No additional management support is needed unless otherwise documented below in the visit note. ? ?Hold dose today and hold dose tomorrow and then continue 1 tablet daily except take 2 tablets on Mondays, Wednesdays and Fridays. Recheck in 2 week. ?

## 2021-10-03 NOTE — Progress Notes (Addendum)
Pt reports she has been taking 1 1/2 tablets on Sundays, Tuesdays, Thursdays and Saturdays instead of the prescribed 1 tablet on those days. Pt reports she made a mistake and thought it was supposed to be 1 1/2 tablets. So no long term change will be made because pt was in range with this dose the last visit. ? ? ?Hold dose today and hold dose tomorrow and then continue 1 tablet daily except take 2 tablets on Mondays, Wednesdays and Fridays. Recheck in 2 week. ?

## 2021-10-04 ENCOUNTER — Telehealth: Payer: Self-pay

## 2021-10-04 NOTE — Telephone Encounter (Signed)
-----   Message from Evelina Bucy, [place] sent at 09/30/2021  8:39 AM EDT ----- ?mBCG x 3 ? ?

## 2021-10-04 NOTE — Telephone Encounter (Signed)
Patient scheduled for 3 mBCG appts. Pt confirmed.  ?

## 2021-10-11 ENCOUNTER — Ambulatory Visit: Payer: BC Managed Care – PPO | Admitting: Physician Assistant

## 2021-10-17 ENCOUNTER — Ambulatory Visit: Payer: BC Managed Care – PPO

## 2021-10-17 DIAGNOSIS — Z7901 Long term (current) use of anticoagulants: Secondary | ICD-10-CM

## 2021-10-17 LAB — POCT INR: INR: 2.4 (ref 2.0–3.0)

## 2021-10-17 NOTE — Progress Notes (Addendum)
Pt will start BCG treatment for bladder cancer tomorrow and will be repeated for 3 weeks.  Increase dose today to take 1 1/2 tablets and then continue 1 tablet daily except take 2 tablets on Mondays, Wednesdays and Fridays. Recheck in 4 week.

## 2021-10-17 NOTE — Patient Instructions (Addendum)
Pre visit review using our clinic review tool, if applicable. No additional management support is needed unless otherwise documented below in the visit note.  Increase dose today to take 1 1/2 tablets and then continue 1 tablet daily except take 2 tablets on Mondays, Wednesdays and Fridays. Recheck in 4 week.

## 2021-10-18 ENCOUNTER — Ambulatory Visit: Payer: BC Managed Care – PPO | Admitting: Physician Assistant

## 2021-10-18 DIAGNOSIS — C679 Malignant neoplasm of bladder, unspecified: Secondary | ICD-10-CM | POA: Diagnosis not present

## 2021-10-18 DIAGNOSIS — N3289 Other specified disorders of bladder: Secondary | ICD-10-CM

## 2021-10-18 DIAGNOSIS — R310 Gross hematuria: Secondary | ICD-10-CM

## 2021-10-18 LAB — URINALYSIS, COMPLETE
Bilirubin, UA: NEGATIVE
Glucose, UA: NEGATIVE
Ketones, UA: NEGATIVE
Leukocytes,UA: NEGATIVE
Nitrite, UA: NEGATIVE
Protein,UA: NEGATIVE
Specific Gravity, UA: 1.02 (ref 1.005–1.030)
Urobilinogen, Ur: 0.2 mg/dL (ref 0.2–1.0)
pH, UA: 7.5 (ref 5.0–7.5)

## 2021-10-18 LAB — MICROSCOPIC EXAMINATION

## 2021-10-18 MED ORDER — BCG LIVE 50 MG IS SUSR
3.2400 mL | Freq: Once | INTRAVESICAL | Status: AC
  Administered 2021-10-18: 81 mg via INTRAVESICAL

## 2021-10-18 NOTE — Progress Notes (Signed)
BCG Bladder Instillation  BCG # 1 of 3  Due to Bladder Cancer patient is present today for a BCG treatment. Patient was cleaned and prepped in a sterile fashion with betadine. A 14FR catheter was inserted, urine return was noted 4m, urine was yellow in color.  515mof reconstituted BCG was instilled into the bladder. The catheter was then removed. Patient tolerated well, no complications were noted  Performed by: SaDebroah LoopPA-C and ReVerlene MayerCMA  Follow up/ Additional notes: 1 week for BCG #2 of 3

## 2021-10-25 ENCOUNTER — Encounter: Payer: Self-pay | Admitting: Gastroenterology

## 2021-10-25 ENCOUNTER — Ambulatory Visit: Payer: BC Managed Care – PPO | Admitting: Physician Assistant

## 2021-10-25 DIAGNOSIS — C679 Malignant neoplasm of bladder, unspecified: Secondary | ICD-10-CM | POA: Diagnosis not present

## 2021-10-25 DIAGNOSIS — N3289 Other specified disorders of bladder: Secondary | ICD-10-CM

## 2021-10-25 DIAGNOSIS — R310 Gross hematuria: Secondary | ICD-10-CM

## 2021-10-25 LAB — URINALYSIS, COMPLETE
Bilirubin, UA: NEGATIVE
Glucose, UA: NEGATIVE
Ketones, UA: NEGATIVE
Nitrite, UA: NEGATIVE
RBC, UA: NEGATIVE
Specific Gravity, UA: 1.03 (ref 1.005–1.030)
Urobilinogen, Ur: 0.2 mg/dL (ref 0.2–1.0)
pH, UA: 6.5 (ref 5.0–7.5)

## 2021-10-25 LAB — MICROSCOPIC EXAMINATION

## 2021-10-25 MED ORDER — BCG LIVE 50 MG IS SUSR
3.2400 mL | Freq: Once | INTRAVESICAL | Status: AC
  Administered 2021-10-25: 81 mg via INTRAVESICAL

## 2021-10-25 NOTE — Progress Notes (Signed)
BCG Bladder Instillation  BCG # 2 of 3  Due to Bladder Cancer patient is present today for a BCG treatment. Patient was cleaned and prepped in a sterile fashion with betadine. A 14FR catheter was inserted, urine return was noted 32m, urine was yellow in color.  583mof reconstituted BCG was instilled into the bladder. The catheter was then removed. Patient tolerated well, no complications were noted  Performed by: SaDebroah LoopPA-C and ReVerlene MayerCMA  Additional notes: She reports some dysuria and urgency following instillation last week. Will restart Myrbetriq; she has some left over from her last BCG course.  Follow up/ Additional notes: 1 week for BCG #3 of 3

## 2021-10-29 ENCOUNTER — Telehealth: Payer: Self-pay

## 2021-10-29 NOTE — Telephone Encounter (Signed)
Patient called stating that she is having dysuria post BCG as well as burning in her vaginal area. Dysuria is only when she urinates but the burning in the vaginal area is constant. Patient unable to state if vagina is red, swollen, or if white/yeast present. She will examine herself and call back with update. She was offered a UA&CX drop off on lab schedule per Sam to rule out infection prior to her BCG instillation this week. She will notify us if she wishes to do this. Patient was encouraged to utilize AZO OTC to help with dysuria. Denies fever, chills, nausea and vomiting.

## 2021-11-01 ENCOUNTER — Encounter: Payer: Self-pay | Admitting: Physician Assistant

## 2021-11-01 ENCOUNTER — Ambulatory Visit (INDEPENDENT_AMBULATORY_CARE_PROVIDER_SITE_OTHER): Payer: BC Managed Care – PPO | Admitting: Physician Assistant

## 2021-11-01 VITALS — Temp 97.9°F

## 2021-11-01 DIAGNOSIS — N3289 Other specified disorders of bladder: Secondary | ICD-10-CM

## 2021-11-01 DIAGNOSIS — N3001 Acute cystitis with hematuria: Secondary | ICD-10-CM

## 2021-11-01 DIAGNOSIS — N390 Urinary tract infection, site not specified: Secondary | ICD-10-CM | POA: Diagnosis not present

## 2021-11-01 LAB — MICROSCOPIC EXAMINATION: WBC, UA: >30 /hpf — AB (ref 0–5)

## 2021-11-01 LAB — URINALYSIS, COMPLETE
Bilirubin, UA: NEGATIVE
Ketones, UA: NEGATIVE
Nitrite, UA: NEGATIVE
Specific Gravity, UA: 1.02 (ref 1.005–1.030)
Urobilinogen, Ur: 1 mg/dL (ref 0.2–1.0)
pH, UA: 8.5 — ABNORMAL HIGH (ref 5.0–7.5)

## 2021-11-01 MED ORDER — AMOXICILLIN-POT CLAVULANATE 875-125 MG PO TABS: 1.0000 | ORAL_TABLET | Freq: Two times a day (BID) | ORAL | 0 refills | Status: AC

## 2021-11-01 NOTE — Progress Notes (Signed)
Patient presented to the clinic today for a scheduled BCG instillation.  Urine grossly infected on UA, culture pending.  Patient reports dysuria, body aches, subjective fever, and malaise but has not taken her temperature at home and she is afebrile this morning in clinic.  Will treat for acute UTI today.  Prescription for Augmentin 875-'125mg'$  BID x5 days sent to pharmacy.  Patient to return to clinic next week for next scheduled BCG treatment.  We will add an additional treatment to her scheduled series to make up for today's missed instillation. We discussed proceeding to the ED if she develops a fever greater than 101F.  Debroah Loop, PA-C 11/01/21 11:06 AM

## 2021-11-05 LAB — CULTURE, URINE COMPREHENSIVE

## 2021-11-08 ENCOUNTER — Ambulatory Visit: Payer: BC Managed Care – PPO | Admitting: Physician Assistant

## 2021-11-08 DIAGNOSIS — N390 Urinary tract infection, site not specified: Secondary | ICD-10-CM

## 2021-11-08 DIAGNOSIS — N3289 Other specified disorders of bladder: Secondary | ICD-10-CM

## 2021-11-08 LAB — URINALYSIS, COMPLETE
Bilirubin, UA: NEGATIVE
Glucose, UA: NEGATIVE
Ketones, UA: NEGATIVE
Nitrite, UA: NEGATIVE
Specific Gravity, UA: 1.02 (ref 1.005–1.030)
Urobilinogen, Ur: 0.2 mg/dL (ref 0.2–1.0)
pH, UA: 7.5 (ref 5.0–7.5)

## 2021-11-08 LAB — MICROSCOPIC EXAMINATION

## 2021-11-08 MED ORDER — FOSFOMYCIN TROMETHAMINE 3 G PO PACK: 3.0000 g | PACK | Freq: Once | ORAL | 0 refills | Status: AC

## 2021-11-08 NOTE — Progress Notes (Signed)
Patient presented to the clinic today for a scheduled BCG instillation.  Urine remains grossly infected on UA, culture pending.  Patient reports she feels better than last week, but her symptoms have not completely resolved. She has persistent frequency and dysuria without fevers.  Will treat for acute UTI today.  Prescription for fosfomycin 3g x1 dose sent to pharmacy and coupon provided.  Patient to return to clinic next week for next scheduled BCG treatment.   Debroah Loop, PA-C 11/08/21 10:36 AM

## 2021-11-11 LAB — CULTURE, URINE COMPREHENSIVE

## 2021-11-12 ENCOUNTER — Ambulatory Visit: Payer: BC Managed Care – PPO | Admitting: Physician Assistant

## 2021-11-12 DIAGNOSIS — C679 Malignant neoplasm of bladder, unspecified: Secondary | ICD-10-CM

## 2021-11-12 DIAGNOSIS — N3289 Other specified disorders of bladder: Secondary | ICD-10-CM

## 2021-11-12 DIAGNOSIS — N3001 Acute cystitis with hematuria: Secondary | ICD-10-CM

## 2021-11-12 LAB — URINALYSIS, COMPLETE
Bilirubin, UA: NEGATIVE
Glucose, UA: NEGATIVE
Ketones, UA: NEGATIVE
Nitrite, UA: NEGATIVE
Specific Gravity, UA: 1.02 (ref 1.005–1.030)
Urobilinogen, Ur: 1 mg/dL (ref 0.2–1.0)
pH, UA: 7 (ref 5.0–7.5)

## 2021-11-12 LAB — MICROSCOPIC EXAMINATION: WBC, UA: >30 /hpf — AB (ref 0–5)

## 2021-11-12 MED ORDER — BCG LIVE 50 MG IS SUSR
3.2400 mL | Freq: Once | INTRAVESICAL | Status: AC
  Administered 2021-11-12: 81 mg via INTRAVESICAL

## 2021-11-12 NOTE — Progress Notes (Signed)
BCG Bladder Instillation  BCG # 3 of 3  Due to Bladder Cancer patient is present today for a BCG treatment. Patient was cleaned and prepped in a sterile fashion with betadine. A 14FR catheter was inserted, urine return was noted 77m, urine was yellow in color.  585mof reconstituted BCG was instilled into the bladder. The catheter was then removed. Patient tolerated well, no complications were noted  Performed by: SaDebroah LoopPA-C and CaElberta LeatherwoodCMA  Follow up/ Additional notes: 3 month cysto with Dr. StBernardo Heater

## 2021-11-14 ENCOUNTER — Ambulatory Visit: Payer: BC Managed Care – PPO

## 2021-11-14 DIAGNOSIS — Z7901 Long term (current) use of anticoagulants: Secondary | ICD-10-CM

## 2021-11-14 LAB — POCT INR: INR: 4.5 — AB (ref 2.0–3.0)

## 2021-11-14 NOTE — Patient Instructions (Addendum)
Pre visit review using our clinic review tool, if applicable. No additional management support is needed unless otherwise documented below in the visit note.  Hold dose today and then reduce dose tomorrow to take 1 tablet and then change weekly dose to take 1 tablet daily except take 2 tablet on Mondays and Thursday. Recheck in 2 weeks.

## 2021-11-15 LAB — CULTURE, URINE COMPREHENSIVE

## 2021-11-28 ENCOUNTER — Ambulatory Visit (INDEPENDENT_AMBULATORY_CARE_PROVIDER_SITE_OTHER): Payer: BC Managed Care – PPO

## 2021-11-28 DIAGNOSIS — Z7901 Long term (current) use of anticoagulants: Secondary | ICD-10-CM | POA: Diagnosis not present

## 2021-11-28 LAB — POCT INR: INR: 4.4 — AB (ref 2.0–3.0)

## 2021-11-28 NOTE — Patient Instructions (Addendum)
Pre visit review using our clinic review tool, if applicable. No additional management support is needed unless otherwise documented below in the visit note.  Hold dose today and hold dose tomorrow to take 1 tablet and then change weekly dose to take 1 tablet daily except take 2 tablet on Thursdays. Recheck in 2 weeks.

## 2021-11-28 NOTE — Progress Notes (Addendum)
Pt started myrbetriq due to bladder spasms from BCG treatments. Pt also having urgency, frequency and dysuria. Pt has also been taking tylenol for the pain but also has taken ibuprofen a few times for the pain.   Hold dose today and hold dose tomorrow to take 1 tablet and then change weekly dose to take 1 tablet daily except take 2 tablet on Thursdays. Recheck in 2 weeks.

## 2021-12-02 ENCOUNTER — Emergency Department: Payer: BC Managed Care – PPO

## 2021-12-02 ENCOUNTER — Other Ambulatory Visit: Payer: Self-pay

## 2021-12-02 ENCOUNTER — Emergency Department
Admission: EM | Admit: 2021-12-02 | Discharge: 2021-12-02 | Disposition: A | Discharge status: Home / Self Care | Payer: BC Managed Care – PPO | Attending: Student in an Organized Health Care Education/Training Program | Admitting: Student in an Organized Health Care Education/Training Program

## 2021-12-02 ENCOUNTER — Encounter: Payer: Self-pay | Admitting: Emergency Medicine

## 2021-12-02 DIAGNOSIS — R1030 Lower abdominal pain, unspecified: Secondary | ICD-10-CM

## 2021-12-02 DIAGNOSIS — R1031 Right lower quadrant pain: Secondary | ICD-10-CM | POA: Diagnosis not present

## 2021-12-02 DIAGNOSIS — Z8551 Personal history of malignant neoplasm of bladder: Secondary | ICD-10-CM | POA: Diagnosis not present

## 2021-12-02 LAB — URINALYSIS, ROUTINE W REFLEX MICROSCOPIC
Bilirubin Urine: NEGATIVE
Glucose, UA: NEGATIVE mg/dL
Hgb urine dipstick: NEGATIVE
Ketones, ur: NEGATIVE mg/dL
Nitrite: NEGATIVE
Protein, ur: 100 mg/dL — AB
Specific Gravity, Urine: >1.046 — ABNORMAL HIGH (ref 1.005–1.030)
WBC, UA: NONE SEEN WBC/hpf (ref 0–5)
pH: 5 (ref 5.0–8.0)

## 2021-12-02 LAB — BASIC METABOLIC PANEL
Anion gap: 9 (ref 5–15)
BUN: 18 mg/dL (ref 8–23)
CO2: 24 mmol/L (ref 22–32)
Calcium: 9.2 mg/dL (ref 8.9–10.3)
Chloride: 108 mmol/L (ref 98–111)
Creatinine, Ser: 1.01 mg/dL — ABNORMAL HIGH (ref 0.44–1.00)
GFR, Estimated: >60 mL/min (ref >60)
Glucose, Bld: 111 mg/dL — ABNORMAL HIGH (ref 70–99)
Potassium: 3.9 mmol/L (ref 3.5–5.1)
Sodium: 141 mmol/L (ref 135–145)

## 2021-12-02 LAB — WET PREP, GENITAL
Clue Cells Wet Prep HPF POC: NONE SEEN
Sperm: NONE SEEN
Trich, Wet Prep: NONE SEEN
WBC, Wet Prep HPF POC: <10 (ref <10)
Yeast Wet Prep HPF POC: NONE SEEN

## 2021-12-02 LAB — CBC
HCT: 34.2 % — ABNORMAL LOW (ref 36.0–46.0)
Hemoglobin: 10 g/dL — ABNORMAL LOW (ref 12.0–15.0)
MCH: 22.5 pg — ABNORMAL LOW (ref 26.0–34.0)
MCHC: 29.2 g/dL — ABNORMAL LOW (ref 30.0–36.0)
MCV: 77 fL — ABNORMAL LOW (ref 80.0–100.0)
Platelets: 398 10*3/uL (ref 150–400)
RBC: 4.44 MIL/uL (ref 3.87–5.11)
RDW: 18.5 % — ABNORMAL HIGH (ref 11.5–15.5)
WBC: 3.8 10*3/uL — ABNORMAL LOW (ref 4.0–10.5)
nRBC: 0 % (ref 0.0–0.2)

## 2021-12-02 MED ORDER — IOHEXOL 350 MG/ML SOLN: 100.0000 mL | Freq: Once | INTRAVENOUS | Status: DC | PRN

## 2021-12-02 MED ORDER — OXYCODONE-ACETAMINOPHEN 5-325 MG PO TABS: 1.0000 | ORAL_TABLET | ORAL | 0 refills | Status: DC | PRN

## 2021-12-02 MED ORDER — ONDANSETRON HCL 4 MG/2ML IJ SOLN
4.0000 mg | Freq: Once | INTRAMUSCULAR | Status: AC
  Administered 2021-12-02: 4 mg via INTRAVENOUS
  Filled 2021-12-02: qty 2

## 2021-12-02 MED ORDER — MORPHINE SULFATE (PF) 4 MG/ML IV SOLN
4.0000 mg | INTRAVENOUS | Status: DC | PRN
  Administered 2021-12-02: 4 mg via INTRAVENOUS
  Filled 2021-12-02: qty 1

## 2021-12-02 MED ORDER — IOHEXOL 300 MG/ML  SOLN
100.0000 mL | Freq: Once | INTRAMUSCULAR | Status: AC | PRN
  Administered 2021-12-02: 100 mL via INTRAVENOUS

## 2021-12-02 MED ORDER — OXYCODONE-ACETAMINOPHEN 5-325 MG PO TABS
1.0000 | ORAL_TABLET | ORAL | Status: DC | PRN
  Administered 2021-12-02: 1 via ORAL
  Filled 2021-12-02: qty 1

## 2021-12-02 NOTE — ED Provider Notes (Signed)
[facility] Provider Note    Event Date/Time   First MD Initiated Contact with Patient 12/02/21 1739     (approximate)   History   No chief complaint on file.   HPI  Theresa Lewis is a 61 y.o. female with a history of bladder cancer presents to the ER for evaluation of tingling sensation in her vagina with moderate to severe pain.  Pain does radiate to the right lower quadrant.  She is able to after bladder.  No burning discomfort.  No vaginal discharge.  He is receiving BCG.  Denies any numbness or tingling.  No fevers or chills.     Physical Exam   Triage Vital Signs: ED Triage Vitals  Enc Vitals Group     BP 12/02/21 1321 (!) 143/86     Pulse Rate 12/02/21 1321 80     Resp 12/02/21 1321 18     Temp 12/02/21 1759 98 F (36.7 C)     Temp Source 12/02/21 1321 Oral     SpO2 12/02/21 1321 98 %     Weight 12/02/21 1321 216 lb 0.8 oz (98 kg)     Height 12/02/21 1321 '5\' 5"'$  (1.651 m)     Head Circumference --      Peak Flow --      Pain Score 12/02/21 1321 10     Pain Loc --      Pain Edu? --      Excl. in [place]? --     Most recent vital signs: Vitals:   12/02/21 1321 12/02/21 1759  BP: (!) 143/86 140/80  Pulse: 80 78  Resp: 18 18  Temp:  98 F (36.7 C)  SpO2: 98% 98%     Constitutional: Alert  Eyes: Conjunctivae are normal.  Head: Atraumatic. Nose: No congestion/rhinnorhea. Mouth/Throat: Mucous membranes are moist.   Neck: Painless ROM.  Cardiovascular:   Good peripheral circulation. Respiratory: Normal respiratory effort.  No retractions.  Gastrointestinal: Soft mild tenderness to palpation of the right lower quadrant and suprapubic region.  External genitalia is normal no fluctuance or abscess.  No evidence of prolapse.  Pelvic exam chaperoned by RN Vaughan Basta with normal-appearing cervix. Musculoskeletal:  no deformity Neurologic:  MAE spontaneously. No gross focal neurologic deficits are appreciated.  Skin:  Skin is warm, dry and  intact. No rash noted. Psychiatric: Mood and affect are normal. Speech and behavior are normal.    ED Results / Procedures / Treatments   Labs (all labs ordered are listed, but only abnormal results are displayed) Labs Reviewed  CBC - Abnormal; Notable for the following components:      Result Value   WBC 3.8 (*)    Hemoglobin 10.0 (*)    HCT 34.2 (*)    MCV 77.0 (*)    MCH 22.5 (*)    MCHC 29.2 (*)    RDW 18.5 (*)    All other components within normal limits  BASIC METABOLIC PANEL - Abnormal; Notable for the following components:   Glucose, Bld 111 (*)    Creatinine, Ser 1.01 (*)    All other components within normal limits  URINALYSIS, ROUTINE W REFLEX MICROSCOPIC - Abnormal; Notable for the following components:   Color, Urine STRAW (*)    APPearance CLEAR (*)    Specific Gravity, Urine >1.046 (*)    Protein, ur 100 (*)    Leukocytes,Ua MODERATE (*)    Bacteria, UA RARE (*)    All other components within  normal limits  WET PREP, GENITAL     EKG     RADIOLOGY Please see ED Course for my review and interpretation.  I personally reviewed all radiographic images ordered to evaluate for the above acute complaints and reviewed radiology reports and findings.  These findings were personally discussed with the patient.  Please see medical record for radiology report.    PROCEDURES:  Critical Care performed: no  Procedures   MEDICATIONS ORDERED IN ED: Medications  oxyCODONE-acetaminophen (PERCOCET/ROXICET) 5-325 MG per tablet 1 tablet (1 tablet Oral Given 12/02/21 1328)  morphine (PF) 4 MG/ML injection 4 mg (4 mg Intravenous Given 12/02/21 1841)  ondansetron (ZOFRAN) injection 4 mg (4 mg Intravenous Given 12/02/21 1840)  iohexol (OMNIPAQUE) 300 MG/ML solution 100 mL (100 mLs Intravenous Contrast Given 12/02/21 1823)     IMPRESSION / MDM / ASSESSMENT AND PLAN / ED COURSE  I reviewed the triage vital signs and the nursing notes.                               Differential diagnosis includes, but is not limited to, cystitis, mass, prolapse, mass, appendicitis, colitis, SBO, urethritis  This patient presents to the ER for evaluation of symptoms as described above.  Patient nontoxic-appearing on presentation complicated by her past medical history age and risk factors.  Exam as above.  Blood work ordered out of triage is reassuring.  Blood work will be sent for the blood differential.   Clinical Course as of 12/02/21 2029  Mon Dec 02, 2021  1840 T imaging on my review interpretation does not show any evidence of obstruction or mass will await formal radiology report. [PR]  2028 Imaging is reassuring.  Wet prep negative.  Is not consistent with PID.  Symptoms been ongoing for quite some time.  Uncertain as to etiology of her symptoms but no sign of UTI.  At this point do believe she stable and appropriate for referral for outpatient work-up.  May be secondary to her treatment from bladder cancer.  Discussed return precautions and supportive care.  Patient agreeable to plan. [PR]    Clinical Course User Index [PR] Merlyn Lot, MD     FINAL CLINICAL IMPRESSION(S) / ED DIAGNOSES   Final diagnoses:  Lower abdominal pain     Rx / DC Orders   ED Discharge Orders          Ordered    oxyCODONE-acetaminophen (PERCOCET) 5-325 MG tablet  Every 4 hours PRN        12/02/21 2027             Note:  This document was prepared using Dragon voice recognition software and may include unintentional dictation errors.    Merlyn Lot, MD 12/02/21 2029

## 2021-12-02 NOTE — Discharge Instructions (Addendum)

## 2021-12-02 NOTE — ED Triage Notes (Signed)
Pt to ED via POV from home. Pt reports since Tuesday she feels like there is something pulling down in her vagina and it feels swollen. Pt denies any abnormal bleeding.   Pt reports hx bladder cancer and had to receive antibiotics after last checkup.  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 Pt reports last INR was 4.4.

## 2021-12-02 NOTE — ED Notes (Signed)
Blue top sent if needed

## 2021-12-03 ENCOUNTER — Telehealth: Payer: Self-pay | Admitting: Emergency Medicine

## 2021-12-03 ENCOUNTER — Telehealth: Payer: Self-pay | Admitting: Physician Assistant

## 2021-12-03 MED ORDER — OXYBUTYNIN CHLORIDE 5 MG PO TABS: 5.0000 mg | ORAL_TABLET | Freq: Three times a day (TID) | ORAL | 2 refills | Status: DC | PRN

## 2021-12-03 MED ORDER — OXYCODONE-ACETAMINOPHEN 5-325 MG PO TABS
1.0000 | ORAL_TABLET | ORAL | 0 refills | Status: DC | PRN
Start: 2021-12-03 — End: 2022-06-05

## 2021-12-03 NOTE — Telephone Encounter (Signed)
Spoke with patient and she will also add the oxybutyin with percocet if it will give her some relief per pt. She will follow-up with Dr. Bernardo Heater in august.

## 2021-12-03 NOTE — Telephone Encounter (Signed)
Please contact the patient and check on her after her ED visit yesterday.  I have reviewed her labs and imaging and note no significant abnormalities; I have low suspicion for UTI at this time.  Vaginal cramping and pain are not typical side effects of intravesical BCG, however in case her vaginal pain is referred bladder discomfort, I think it would be appropriate to start her on a course of oxybutynin 5 mg 3 times daily as needed. I've sent this rx to the [facility] in [place]. She should follow up with Dr. Bernardo Heater as scheduled.

## 2021-12-03 NOTE — Telephone Encounter (Signed)
Pharmacy states they did not receive the patient's Percocet prescription.  It appears to have been sent electronically.  I will resend it electronically to the same pharmacy.

## 2021-12-04 ENCOUNTER — Ambulatory Visit (INDEPENDENT_AMBULATORY_CARE_PROVIDER_SITE_OTHER): Payer: BC Managed Care – PPO | Admitting: Primary Care

## 2021-12-04 ENCOUNTER — Encounter: Payer: Self-pay | Admitting: Primary Care

## 2021-12-04 VITALS — BP 118/84 | HR 58 | Temp 98.0°F | Ht 65.0 in | Wt 214.4 lb

## 2021-12-04 DIAGNOSIS — R102 Pelvic and perineal pain: Secondary | ICD-10-CM | POA: Insufficient documentation

## 2021-12-04 DIAGNOSIS — G500 Trigeminal neuralgia: Secondary | ICD-10-CM

## 2021-12-04 HISTORY — DX: Pelvic and perineal pain: R10.2

## 2021-12-04 MED ORDER — GABAPENTIN 100 MG PO CAPS
ORAL_CAPSULE | ORAL | 0 refills | Status: DC
Start: 2021-12-04 — End: 2022-06-22

## 2021-12-04 MED ORDER — CYCLOBENZAPRINE HCL 5 MG PO TABS: 5.0000 mg | ORAL_TABLET | Freq: Three times a day (TID) | ORAL | 0 refills | Status: DC | PRN

## 2021-12-04 NOTE — Assessment & Plan Note (Signed)
Unclear etiology, although symptoms could very well be muscular.  It seems less likely that her symptoms are secondary to bladder spasms.  Reviewed ED notes, labs, imaging from 12/02/2021.   I offered to complete a pelvic exam today, however we both feel that since she had a pelvic exam 2 days ago the exam findings may not be different.  Start cyclobenzaprine 5 mg 3 times daily as needed. Discussed to start stretching and walking when able. Discussed to limit use of Percocet when possible.  Consider addition of gabapentin for pain in place of Percocet.  Consider pelvic floor physical therapy.  She will update in 2 days.

## 2021-12-04 NOTE — Progress Notes (Signed)
Subjective:    Patient ID: Theresa Lewis, female    DOB: 03-09-1961, 61 y.o.   MRN: [id number]  HPI  Theresa Lewis is a very pleasant 61 y.o. female with a history of urothelial carcinoma of bladder, antiphospholipid antibody syndrome, prediabetes, vulvar skin tag, internal hemorrhoids who presents today to discuss pelvic discomfort.  She is also needing a refill of her gabapentin.  Evaluated at [facility] ED on 12/02/2021 for tingling sensation to vagina with moderate to severe pain.  She did endorse radiation to right lower abdomen.  During her stay in the ED she underwent wet prep which was negative.  Exam not consistent with PID.  UA was without evidence of UTI.  She underwent CT abdomen pelvis which was without acute findings.  She was discharged home later that evening with a prescription for Percocet.  She was contacted by her urology office yesterday who reviewed her ED visit.  Their suspicion for UTI was low, and they mentioned that vaginal cramping and pain were not typical side effects of intravesical BCG treatment.  It was suspected that her symptoms could be referred bladder discomfort so she was initiated on a course of oxybutynin 5 mg 3 times daily as needed.  Today she explains a pulling type muscle ache pain in the vaginal canal that began 8 days ago. Two evenings ago her pain became so severe for which prompted her to go to the ED. During BCG treatments she experiences side effects of urinary urgency and leakage. As a result of her treatments she has to strain to have a bowel movement or urinate,  so she believes she may have pulled a muscle while trying to urinate after one of her treatments.   Symptoms are worse when laying on her side in bed, sitting in a chair with a soft cushion. Heating pads and sitting on a firm surface helps to improve pain.   She denies heavy lifting, strenuous activity, groin numbness, back pain, foul-smelling urine, hematuria. She's been taking  Percocet which has helped to reduce her pain. She took a dose of her gabapentin a few days ago which helped some. She took one dose of oxybutynin today but hasn't noticed any improvement.  She does not believe her symptoms are secondary to her bladder.  Her pain is provoked with certain movements.  Review of Systems  Constitutional:  Negative for chills and fever.  Gastrointestinal:  Negative for abdominal pain.  Genitourinary:  Positive for frequency, pelvic pain, urgency and vaginal pain. Negative for dysuria, vaginal bleeding and vaginal discharge.  Musculoskeletal:  Positive for back pain.         Past Medical History:  Diagnosis Date   ABNORMAL VAGINAL BLEEDING 04/19/2007   Qualifier: Diagnosis of  By: Hulan Saas, CMA (AAMA), Larene Beach S    Acute cystitis without hematuria 01/31/2020   Anemia    h/o as a child   Cancer ([place])    Complication of anesthesia    stays asleep for a "very long time"   Deep venous thrombosis ([place])    2006    3 x clotting    GERD (gastroesophageal reflux disease)    HEMORRHOIDS, WITH BLEEDING 12/26/2008   Qualifier: Diagnosis of  By: Regis Bill MD, Standley Brooking    PE (pulmonary embolism) 07/09/2010   Pre-diabetes    Pulmonary embolism ([place])     Social History   Socioeconomic History   Marital status: Married    Spouse name: Not on file   Number  of children: 1   Years of education: Not on file   Highest education level: Not on file  Occupational History   Occupation: student advocate   Tobacco Use   Smoking status: Former    Years: 4.00    Types: Cigarettes    Passive exposure: Past   Smokeless tobacco: Never  Vaping Use   Vaping Use: Never used  Substance and Sexual Activity   Alcohol use: Not Currently    Comment: rarely   Drug use: No   Sexual activity: Not on file  Other Topics Concern   Not on file  Social History Narrative   HH of 3  Daughter 29 and self.  hh of 2    No pets.   No ets.   No etoh.    Working for [employer] and [employer] .   45- 50 hours per week.                Social Determinants of Health   Financial Resource Strain: Not on file  Food Insecurity: Not on file  Transportation Needs: Not on file  Physical Activity: Not on file  Stress: Not on file  Social Connections: Not on file  Intimate Partner Violence: Not on file    Past Surgical History:  Procedure Laterality Date   MYOMECTOMY     rectal tear     TRANSURETHRAL RESECTION OF BLADDER TUMOR N/A 04/24/2020   Procedure: TRANSURETHRAL RESECTION OF BLADDER TUMOR (TURBT);  Surgeon: Abbie Sons, MD;  Location: [facility] ORS;  Service: Urology;  Laterality: N/A;   TRANSURETHRAL RESECTION OF BLADDER TUMOR N/A 05/15/2020   Procedure: TRANSURETHRAL RESECTION OF BLADDER TUMOR (TURBT);  Surgeon: Abbie Sons, MD;  Location: [facility] ORS;  Service: Urology;  Laterality: N/A;   TRANSURETHRAL RESECTION OF BLADDER TUMOR N/A 12/04/2020   Procedure: TRANSURETHRAL RESECTION OF BLADDER TUMOR (TURBT);  Surgeon: Abbie Sons, MD;  Location: [facility] ORS;  Service: Urology;  Laterality: N/A;   TUBAL LIGATION     vena caval filter      Family History  Problem Relation Age of Onset   Hypertension Mother    Rheum arthritis Mother    Lung cancer Father 7       april 12    Pancreatic cancer Father 64   Diabetes Paternal Aunt        x2   Colon cancer Neg Hx    Colon polyps Neg Hx    Kidney disease Neg Hx    Esophageal cancer Neg Hx    Gallbladder disease Neg Hx    Rectal cancer Neg Hx    Stomach cancer Neg Hx    Breast cancer Neg Hx     Allergies  Allergen Reactions   Clindamycin/Lincomycin     "unknown"   Tape Other (See Comments)    When pt has a band-aid on, the impression of band-aid stays on for a month    Current Outpatient Medications on File Prior to Visit  Medication Sig Dispense Refill   lidocaine (XYLOCAINE) 5 % ointment Apply 1 application topically as needed. 35.44 g 0   mirabegron ER (MYRBETRIQ) 25 MG TB24 tablet Take 1 tablet (25 mg  total) by mouth daily. 56 tablet 0   omeprazole (PRILOSEC) 20 MG capsule TAKE 1 CAPSULE BY MOUTH THREE TIMES WEEKLY FOR HEARTBURN. 36 capsule 3   oxybutynin (DITROPAN) 5 MG tablet Take 1 tablet (5 mg total) by mouth every 8 (eight) hours as needed for bladder spasms. [place]  tablet 2   oxyCODONE-acetaminophen (PERCOCET) 5-325 MG tablet Take 1 tablet by mouth every 4 (four) hours as needed for severe pain. 10 tablet 0   polyethylene glycol (MIRALAX / GLYCOLAX) 17 g packet Take 17 g by mouth daily as needed for moderate constipation.     warfarin (COUMADIN) 5 MG tablet Take 2 tablets by mouth on Monday, Wednesday, Friday. Take 1.5 tablets by mouth all other days. OR AS DIRECTED BY COUMADIN CLINIC. 145 tablet 1   No current facility-administered medications on file prior to visit.    BP 118/84 (BP Location: Left Arm, Patient Position: Sitting)   Pulse (!) 58   Temp 98 F (36.7 C) (Oral)   Ht '5\' 5"'$  (1.651 m)   Wt 214 lb 6 oz (97.2 kg)   SpO2 98%   BMI 35.67 kg/m  Objective:   Physical Exam Constitutional:      General: She is not in acute distress. Cardiovascular:     Rate and Rhythm: Normal rate.  Pulmonary:     Effort: Pulmonary effort is normal.  Abdominal:     Palpations: Abdomen is soft.     Tenderness: There is no abdominal tenderness.  Musculoskeletal:     Lumbar back: No tenderness or bony tenderness. Normal range of motion. Negative right straight leg raise test and negative left straight leg raise test.  Skin:    General: Skin is warm and dry.           Assessment & Plan:   Problem List Items Addressed This Visit       Nervous and Auditory   Trigeminal neuralgia   Relevant Medications   cyclobenzaprine (FLEXERIL) 5 MG tablet   gabapentin (NEURONTIN) 100 MG capsule     Other   Acute pelvic pain, female - Primary    Unclear etiology, although symptoms could very well be muscular.  It seems less likely that her symptoms are secondary to bladder spasms.  Reviewed  ED notes, labs, imaging from 12/02/2021.   I offered to complete a pelvic exam today, however we both feel that since she had a pelvic exam 2 days ago the exam findings may not be different.  Start cyclobenzaprine 5 mg 3 times daily as needed. Discussed to start stretching and walking when able. Discussed to limit use of Percocet when possible.  Consider addition of gabapentin for pain in place of Percocet.  Consider pelvic floor physical therapy.  She will update in 2 days.      Relevant Medications   cyclobenzaprine (FLEXERIL) 5 MG tablet       Pleas Koch, NP

## 2021-12-04 NOTE — Patient Instructions (Signed)
Start cyclobenzaprine muscle relaxer. Take 1 tablet by mouth three times daily as needed. This may cause drowsiness.  Try walking and stretching if possible.   Please update me Friday as discussed!

## 2021-12-07 ENCOUNTER — Other Ambulatory Visit: Payer: Self-pay | Admitting: Primary Care

## 2021-12-07 DIAGNOSIS — D6869 Other thrombophilia: Secondary | ICD-10-CM

## 2021-12-07 DIAGNOSIS — Z86718 Personal history of other venous thrombosis and embolism: Secondary | ICD-10-CM

## 2021-12-12 ENCOUNTER — Ambulatory Visit (INDEPENDENT_AMBULATORY_CARE_PROVIDER_SITE_OTHER): Payer: BC Managed Care – PPO

## 2021-12-12 DIAGNOSIS — Z7901 Long term (current) use of anticoagulants: Secondary | ICD-10-CM | POA: Diagnosis not present

## 2021-12-12 LAB — POCT INR: INR: 1.6 — AB (ref 2.0–3.0)

## 2021-12-12 NOTE — Patient Instructions (Addendum)
Pre visit review using our clinic review tool, if applicable. No additional management support is needed unless otherwise documented below in the visit note.  Increase dose today to take 3 tablets and increase dose tomorrow to take 1 1/2 tablets and then change weekly dose to take 1 1/2 tablets daily except take 1 tablet on Mondays, Wednesdays and Fridays. Recheck in 2 weeks.

## 2021-12-12 NOTE — Progress Notes (Signed)
Pt in ER since last visit for abdominal pain. No etiology found. No abnormal bruising or bleeding. Pt had apt with PCP for ER f/u and was prescribed muscle relaxer and advised to use gabapentin for pain. Diagnosis possible muscle strain of pelvic/bladder wall.  Pt reports pain is better but not back to normal. Still has pulling sensation. Reports gabapentin is helping and she is still taking oxybutynin.  Increase dose today to take 3 tablets and increase dose tomorrow to take 1 1/2 tablets and then change weekly dose to take 1 1/2 tablets daily except take 1 tablet on Mondays, Wednesdays and Fridays. Recheck in 2 weeks.

## 2021-12-13 ENCOUNTER — Other Ambulatory Visit: Payer: BC Managed Care – PPO | Admitting: Urology

## 2021-12-26 ENCOUNTER — Ambulatory Visit: Payer: BC Managed Care – PPO

## 2021-12-26 DIAGNOSIS — Z7901 Long term (current) use of anticoagulants: Secondary | ICD-10-CM | POA: Diagnosis not present

## 2021-12-26 LAB — POCT INR: INR: 1.6 — AB (ref 2.0–3.0)

## 2021-12-26 NOTE — Patient Instructions (Addendum)
Pre visit review using our clinic review tool, if applicable. No additional management support is needed unless otherwise documented below in the visit note.  Increase dose today to take 3 tablets and increase dose tomorrow to take 1 1/2 tablets and then change weekly dose to take 1 1/2 tablets daily except take 1 tablet on Mondays, Wednesdays and Fridays. Recheck in 2 weeks.

## 2021-12-26 NOTE — Progress Notes (Addendum)
Pt started myrbetriq due to bladder spasms from BCG treatments. Pt in ER since last visit for abdominal pain. No etiology found. No abnormal bruising or bleeding. Pt had apt with PCP for ER f/u and was prescribed muscle relaxer and advised to use gabapentin for pain. Diagnosis possible muscle strain of pelvic/bladder wall. Pt reports pain is better but not back to normal. Still has pulling sensation. Reports gabapentin is helping and she is still taking oxybutynin.   Pt reported today she just finished all of the medication listed above and would like to keep the prior dosing because higher dosing has pushed her above her target. Interaction with the medications above have been noted. Pt will have booster dose today and tomorrow and resume changes made to dosing last apt and recheck in 2 weeks.  Pt also reported lump, she reports hard with defined borders and about the size of her palm, on the thigh above L knee. Started about a week ago when she started all the medication for bladder spasms. Lump has decreased in size now and is size of silver dollar but no redness, pain or warmth. Pt is now finished with all of the medications. Advised pt if this lump reoccurs or gets bigger or she has any symptoms mentioned above to contact office. Pt reports she is very aware of signs and symptoms of DVT and it is not acting as a DVT. Pt advised to call clinic or go to ER if changes. Pt verbalized understanding.   Increase dose today to take 3 tablets and increase dose tomorrow to take 1 1/2 tablets and then change weekly dose to take 1 1/2 tablets daily except take 1 tablet on Mondays, Wednesdays and Fridays. Recheck in 2 weeks.

## 2022-01-02 ENCOUNTER — Ambulatory Visit (INDEPENDENT_AMBULATORY_CARE_PROVIDER_SITE_OTHER): Payer: BC Managed Care – PPO | Admitting: Primary Care

## 2022-01-02 ENCOUNTER — Encounter: Payer: Self-pay | Admitting: Primary Care

## 2022-01-02 VITALS — BP 120/74 | HR 73 | Temp 98.6°F | Ht 65.0 in | Wt 212.0 lb

## 2022-01-02 DIAGNOSIS — M7989 Other specified soft tissue disorders: Secondary | ICD-10-CM | POA: Diagnosis not present

## 2022-01-02 NOTE — Progress Notes (Signed)
Subjective:    Patient ID: Theresa Lewis, female    DOB: 1961-03-16, 61 y.o.   MRN: [id number]  HPI  Theresa Lewis is a very pleasant 61 y.o. female with a history of urothelial carcinoma of bladder, antiphospholipid antibody syndrome, venous thrombosis and embolism, chronic anticoagulant use, prediabetes who presents today to discuss skin mass.   The mass is located to the left anterior thigh proximal to her knee for which she initially noticed about three weeks ago. She denies pain, erythema, increased or decreased size in the mass, injury/trauma. She is compliant to her warfarin as prescribed.   She does experience chronic knee pain to the right side, has begun to notice left anterior knee pain.    Review of Systems  Musculoskeletal:  Positive for arthralgias.  Skin:  Negative for color change.       Soft tissue skin mass         Past Medical History:  Diagnosis Date   ABNORMAL VAGINAL BLEEDING 04/19/2007   Qualifier: Diagnosis of  By: Hulan Saas, CMA (AAMA), Larene Beach S    Acute cystitis without hematuria 01/31/2020   Anemia    h/o as a child   Cancer ([place])    Complication of anesthesia    stays asleep for a "very long time"   Deep venous thrombosis ([place])    2006    3 x clotting    GERD (gastroesophageal reflux disease)    HEMORRHOIDS, WITH BLEEDING 12/26/2008   Qualifier: Diagnosis of  By: Regis Bill MD, Standley Brooking    PE (pulmonary embolism) 07/09/2010   Pre-diabetes    Pulmonary embolism ([place])     Social History   Socioeconomic History   Marital status: Married    Spouse name: Not on file   Number of children: 1   Years of education: Not on file   Highest education level: Not on file  Occupational History   Occupation: student advocate   Tobacco Use   Smoking status: Former    Years: 4.00    Types: Cigarettes    Passive exposure: Past   Smokeless tobacco: Never  Vaping Use   Vaping Use: Never used  Substance and Sexual Activity   Alcohol use: Not  Currently    Comment: rarely   Drug use: No   Sexual activity: Not on file  Other Topics Concern   Not on file  Social History Narrative   HH of 3  Daughter 82 and self.  hh of 2    No pets.   No ets.   No etoh.    Working for [employer] and [employer] .  45- 50 hours per week.                Social Determinants of Health   Financial Resource Strain: Not on file  Food Insecurity: Not on file  Transportation Needs: Not on file  Physical Activity: Not on file  Stress: Not on file  Social Connections: Not on file  Intimate Partner Violence: Not on file    Past Surgical History:  Procedure Laterality Date   MYOMECTOMY     rectal tear     TRANSURETHRAL RESECTION OF BLADDER TUMOR N/A 04/24/2020   Procedure: TRANSURETHRAL RESECTION OF BLADDER TUMOR (TURBT);  Surgeon: Abbie Sons, MD;  Location: [facility] ORS;  Service: Urology;  Laterality: N/A;   TRANSURETHRAL RESECTION OF BLADDER TUMOR N/A 05/15/2020   Procedure: TRANSURETHRAL RESECTION OF BLADDER TUMOR (TURBT);  Surgeon: Abbie Sons, MD;  Location: [facility] OR[facility];  Service: Urology;  Laterality: N/A;   TRANSURETHRAL RESECTION OF BLADDER TUMOR N/A 12/04/2020   Procedure: TRANSURETHRAL RESECTION OF BLADDER TUMOR (TURBT);  Surgeon: Abbie Sons, MD;  Location: [facility] ORS;  Service: Urology;  Laterality: N/A;   TUBAL LIGATION     vena caval filter      Family History  Problem Relation Age of Onset   Hypertension Mother    Rheum arthritis Mother    Lung cancer Father 37       april 12    Pancreatic cancer Father 68   Diabetes Paternal Aunt        x2   Colon cancer Neg Hx    Colon polyps Neg Hx    Kidney disease Neg Hx    Esophageal cancer Neg Hx    Gallbladder disease Neg Hx    Rectal cancer Neg Hx    Stomach cancer Neg Hx    Breast cancer Neg Hx     Allergies  Allergen Reactions   Clindamycin/Lincomycin     "unknown"   Tape Other (See Comments)    When pt has a band-aid on, the impression of band-aid stays on for a  month    Current Outpatient Medications on File Prior to Visit  Medication Sig Dispense Refill   cyclobenzaprine (FLEXERIL) 5 MG tablet Take 1 tablet (5 mg total) by mouth 3 (three) times daily as needed for muscle spasms. 30 tablet 0   gabapentin (NEURONTIN) 100 MG capsule Take 1 capsule by mouth three times weekly as needed. 90 capsule 0   lidocaine (XYLOCAINE) 5 % ointment Apply 1 application topically as needed. 35.44 g 0   mirabegron ER (MYRBETRIQ) 25 MG TB24 tablet Take 1 tablet (25 mg total) by mouth daily. 56 tablet 0   omeprazole (PRILOSEC) 20 MG capsule TAKE 1 CAPSULE BY MOUTH THREE TIMES WEEKLY FOR HEARTBURN. 36 capsule 3   oxybutynin (DITROPAN) 5 MG tablet Take 1 tablet (5 mg total) by mouth every 8 (eight) hours as needed for bladder spasms. 30 tablet 2   oxyCODONE-acetaminophen (PERCOCET) 5-325 MG tablet Take 1 tablet by mouth every 4 (four) hours as needed for severe pain. 10 tablet 0   polyethylene glycol (MIRALAX / GLYCOLAX) 17 g packet Take 17 g by mouth daily as needed for moderate constipation.     warfarin (COUMADIN) 5 MG tablet Take 1 tablet by mouth everyday except for Thursday. Take 2 tablets by mouth on Thursdays. 102 tablet 1   No current facility-administered medications on file prior to visit.    BP 120/74   Pulse 73   Temp 98.6 F (37 C) (Oral)   Ht '5\' 5"'$  (1.651 m)   Wt 212 lb (96.2 kg)   SpO2 98%   BMI 35.28 kg/m  Objective:   Physical Exam Musculoskeletal:     Right knee: No swelling. Normal range of motion.     Left knee: No swelling. Normal range of motion.     Comments: 7 cm X 4 cm soft tissue mass to left anterior/medial thigh proximal to knee. Immobile, non tender. No erythema. No popliteal mass to either side.   Skin:    General: Skin is warm and dry.     Findings: No erythema.           Assessment & Plan:   Problem List Items Addressed This Visit       Other   Mass of soft tissue of left lower extremity - Primary  From exam her  soft tissue mass appears to be a fatty cyst or some form of cyst. Low suspicion for DVT. Given her history of bladder cancer we need to rule out any tumor growth.  US soft tissue of lower extremity ordered and pending. Await results.       Relevant Orders   [place] LT LOWER EXTREM LTD SOFT TISSUE NON VASCULAR       Pleas Koch, NP

## 2022-01-02 NOTE — Patient Instructions (Signed)
You will be contacted regarding your ultrasound.  Please let us know if you have not been contacted within two weeks.   It was a pleasure to see you today!

## 2022-01-02 NOTE — Assessment & Plan Note (Signed)
From exam her soft tissue mass appears to be a fatty cyst or some form of cyst. Low suspicion for DVT. Given her history of bladder cancer we need to rule out any tumor growth.  US soft tissue of lower extremity ordered and pending. Await results.

## 2022-01-03 ENCOUNTER — Ambulatory Visit
Admission: RE | Admit: 2022-01-03 | Discharge: 2022-01-03 | Disposition: A | Discharge status: Home / Self Care | Payer: BC Managed Care – PPO | Source: Ambulatory Visit | Attending: Primary Care | Admitting: Primary Care

## 2022-01-03 ENCOUNTER — Ambulatory Visit: Payer: BC Managed Care – PPO | Admitting: Urology

## 2022-01-03 ENCOUNTER — Encounter: Payer: Self-pay | Admitting: Urology

## 2022-01-03 VITALS — BP 120/82 | HR 66 | Ht 65.0 in | Wt 212.0 lb

## 2022-01-03 DIAGNOSIS — M7989 Other specified soft tissue disorders: Secondary | ICD-10-CM

## 2022-01-03 DIAGNOSIS — C679 Malignant neoplasm of bladder, unspecified: Secondary | ICD-10-CM

## 2022-01-03 LAB — URINALYSIS, COMPLETE
Bilirubin, UA: NEGATIVE
Glucose, UA: NEGATIVE
Ketones, UA: NEGATIVE
Nitrite, UA: NEGATIVE
Specific Gravity, UA: 1.025 (ref 1.005–1.030)
Urobilinogen, Ur: 0.2 mg/dL (ref 0.2–1.0)
pH, UA: 8.5 — ABNORMAL HIGH (ref 5.0–7.5)

## 2022-01-03 LAB — MICROSCOPIC EXAMINATION

## 2022-01-03 MED ORDER — SULFAMETHOXAZOLE-TRIMETHOPRIM 800-160 MG PO TABS
1.0000 | ORAL_TABLET | Freq: Once | ORAL | Status: AC
  Administered 2022-01-03: 1 via ORAL

## 2022-01-03 NOTE — Progress Notes (Signed)
   01/03/22  CC:  Chief Complaint  Patient presents with   Cysto    Urologic history:  1.T1 high grade urothelial carcinoma bladder TURBT 03/2020 >6 cm papillary bladder tumor Path T1 high-grade urothelial carcinoma Restaging TURBT 04/2020; no residual tumor 6 week induction BCG completed March 2022 Multifocal recurrence cystoscopy 10/31/2020 TURBT 11/2020 Ta high-grade UCa Reinduction BCG completed 02/22/2021 3-week maintenance BCG: 10/2021  HPI: 61 y.o. female presents for surveillance cystoscopy.  Had positive urine culture and cultures grew mixed flora with maintenance BCG.  Also had a ED visit for pelvic symptoms of undetermined etiology.  Currently feeling better Blood pressure 120/77, pulse 61, height '5\' 7"'$  (1.702 m),   Cystoscopy Procedure Note  Patient identification was confirmed, informed consent was obtained, and patient was prepped using Betadine solution.  Lidocaine jelly was administered per urethral meatus.    Procedure: - Flexible cystoscope introduced, without any difficulty.   - Thorough search of the bladder revealed:    normal urethral meatus    no solid or papillary lesions    Mild posterior wall erythema    no stones    no ulcers     no tumors    no urethral polyps    no trabeculation  - Ureteral orifices were normal in position and appearance.  Post-Procedure: - Patient tolerated the procedure well  Assessment/ Plan: No recurrent bladder tumor Mild posterior wall erythema Urine cytology sent  Surveillance cystoscopy 3 months Tentative maintenance BCG December 2023    Abbie Sons, MD

## 2022-01-03 NOTE — Addendum Note (Signed)
Addended by: Kyra Manges on: 01/03/2022 01:30 PM   Modules accepted: Orders

## 2022-01-07 LAB — CYTOLOGY - NON PAP

## 2022-01-08 ENCOUNTER — Encounter: Payer: Self-pay | Admitting: *Deleted

## 2022-01-09 ENCOUNTER — Ambulatory Visit (INDEPENDENT_AMBULATORY_CARE_PROVIDER_SITE_OTHER): Payer: BC Managed Care – PPO

## 2022-01-09 DIAGNOSIS — Z7901 Long term (current) use of anticoagulants: Secondary | ICD-10-CM

## 2022-01-09 LAB — POCT INR: INR: 2.1 (ref 2.0–3.0)

## 2022-01-09 NOTE — Progress Notes (Signed)
Increase dose today to take 2 tablets and increase dose tomorrow to take 1 1/2 tablets and then change weekly dose to take 1 1/2 tablets daily except take 1 tablet on Mondays. Recheck in 2 weeks.

## 2022-01-09 NOTE — Patient Instructions (Addendum)
Pre visit review using our clinic review tool, if applicable. No additional management support is needed unless otherwise documented below in the visit note.  Increase dose today to take 2 tablets and increase dose tomorrow to take 1 1/2 tablets and then change weekly dose to take 1 1/2 tablets daily except take 1 tablet on Mondays. Recheck in 2 weeks.

## 2022-01-23 ENCOUNTER — Ambulatory Visit: Payer: BC Managed Care – PPO

## 2022-01-23 DIAGNOSIS — Z7901 Long term (current) use of anticoagulants: Secondary | ICD-10-CM | POA: Diagnosis not present

## 2022-01-23 LAB — POCT INR: INR: 2.3 (ref 2.0–3.0)

## 2022-01-23 NOTE — Progress Notes (Addendum)
Increase dose today to take 2 1/2 tablets and then change weekly dose to take 1 1/2 tablets daily. Recheck in 3 weeks.   Pt also requested to schedule apt for October for INR check before she goes on a cruise. She is scheduled for 10/26.

## 2022-01-23 NOTE — Patient Instructions (Addendum)
Pre visit review using our clinic review tool, if applicable. No additional management support is needed unless otherwise documented below in the visit note.  Increase dose today to take 2 1/2 tablets and then change weekly dose to take 1 1/2 tablets daily. Recheck in 3 weeks.

## 2022-02-13 ENCOUNTER — Ambulatory Visit: Payer: BC Managed Care – PPO

## 2022-02-13 DIAGNOSIS — Z7901 Long term (current) use of anticoagulants: Secondary | ICD-10-CM

## 2022-02-13 LAB — POCT INR: INR: 2.8 (ref 2.0–3.0)

## 2022-02-13 NOTE — Progress Notes (Signed)
Pt also requested to schedule apt for October for INR check before she goes on a cruise. She is scheduled for 10/26. Continue 1 1/2 tablets daily. Recheck in 4 weeks.

## 2022-02-13 NOTE — Patient Instructions (Addendum)
Pre visit review using our clinic review tool, if applicable. No additional management support is needed unless otherwise documented below in the visit note.  Continue 1 1/2 tablets daily. Recheck in 4 weeks.

## 2022-03-20 ENCOUNTER — Ambulatory Visit: Payer: BC Managed Care – PPO

## 2022-03-20 DIAGNOSIS — Z7901 Long term (current) use of anticoagulants: Secondary | ICD-10-CM | POA: Diagnosis not present

## 2022-03-20 LAB — POCT INR: INR: 2.6 (ref 2.0–3.0)

## 2022-03-20 NOTE — Progress Notes (Signed)
Pt reports missing last night's dose.  Continue 1 1/2 tablets daily. Recheck in 5 weeks.

## 2022-03-20 NOTE — Patient Instructions (Signed)
Continue 1 1/2 tablets daily. Recheck in 5 weeks, on 04/24/22 at 9:30.

## 2022-04-11 ENCOUNTER — Ambulatory Visit: Payer: BC Managed Care – PPO | Admitting: Urology

## 2022-04-11 ENCOUNTER — Encounter: Payer: Self-pay | Admitting: Urology

## 2022-04-11 VITALS — BP 121/78 | HR 88 | Ht 65.0 in | Wt 216.0 lb

## 2022-04-11 DIAGNOSIS — Z8551 Personal history of malignant neoplasm of bladder: Secondary | ICD-10-CM

## 2022-04-11 DIAGNOSIS — C679 Malignant neoplasm of bladder, unspecified: Secondary | ICD-10-CM

## 2022-04-11 LAB — URINALYSIS, COMPLETE
Bilirubin, UA: NEGATIVE
Glucose, UA: NEGATIVE
Ketones, UA: NEGATIVE
Leukocytes,UA: NEGATIVE
Nitrite, UA: NEGATIVE
Protein,UA: NEGATIVE
RBC, UA: NEGATIVE
Specific Gravity, UA: 1.025 (ref 1.005–1.030)
Urobilinogen, Ur: 0.2 mg/dL (ref 0.2–1.0)
pH, UA: 5.5 (ref 5.0–7.5)

## 2022-04-11 LAB — MICROSCOPIC EXAMINATION

## 2022-04-11 NOTE — Progress Notes (Signed)
    04/11/22  CC:  Chief Complaint  Patient presents with   Cysto    Urologic history:  1.T1 high grade urothelial carcinoma bladder TURBT 03/2020 >6 cm papillary bladder tumor Path T1 high-grade urothelial carcinoma Restaging TURBT 04/2020; no residual tumor 6 week induction BCG completed March 2022 Multifocal recurrence cystoscopy 10/31/2020 TURBT 11/2020 Ta high-grade UCa Reinduction BCG completed 02/22/2021 3-week maintenance BCG: 10/2021  HPI: 61 y.o. female presents for surveillance cystoscopy.    Blood pressure 120/77, pulse 61, height '5\' 7"'$  (1.702 m),   Cystoscopy Procedure Note  Patient identification was confirmed, informed consent was obtained, and patient was prepped using Betadine solution.  Lidocaine jelly was administered per urethral meatus.    Procedure: - Flexible cystoscope introduced, without any difficulty.   - Thorough search of the bladder revealed:    normal urethral meatus    no solid or papillary lesions    no stones    no ulcers     no tumors    no urethral polyps    no trabeculation  - Ureteral orifices were normal in position and appearance.  Post-Procedure: - Patient tolerated the procedure well  Assessment/ Plan: No recurrent bladder tumor Surveillance cystoscopy 3 months Maintenance BCG December 2023    Abbie Sons, MD

## 2022-04-24 ENCOUNTER — Ambulatory Visit: Payer: BC Managed Care – PPO

## 2022-04-24 DIAGNOSIS — Z7901 Long term (current) use of anticoagulants: Secondary | ICD-10-CM

## 2022-04-24 LAB — POCT INR: INR: 2.4 (ref 2.0–3.0)

## 2022-04-24 NOTE — Patient Instructions (Signed)
Increase dose today to 2 tablets.   Then continue 1 1/2 tablets daily. Recheck on 06/05/22 at 8:15.

## 2022-04-24 NOTE — Progress Notes (Signed)
Increase dose today to 2 tablets.   Then continue 1 1/2 tablets daily. Recheck in 6 weeks per pt request.

## 2022-06-01 ENCOUNTER — Other Ambulatory Visit: Payer: Self-pay | Admitting: Primary Care

## 2022-06-01 DIAGNOSIS — Z86718 Personal history of other venous thrombosis and embolism: Secondary | ICD-10-CM

## 2022-06-01 DIAGNOSIS — D6869 Other thrombophilia: Secondary | ICD-10-CM

## 2022-06-01 NOTE — Telephone Encounter (Signed)
Patient is due for her annual CPE/follow up with me. Please schedule.

## 2022-06-02 NOTE — Telephone Encounter (Signed)
Patient has been scheduled

## 2022-06-05 ENCOUNTER — Ambulatory Visit: Payer: BC Managed Care – PPO

## 2022-06-05 ENCOUNTER — Encounter: Payer: Self-pay | Admitting: Primary Care

## 2022-06-05 ENCOUNTER — Other Ambulatory Visit (HOSPITAL_COMMUNITY)
Admission: RE | Admit: 2022-06-05 | Discharge: 2022-06-05 | Disposition: A | Discharge status: Home / Self Care | Payer: BC Managed Care – PPO | Source: Ambulatory Visit | Attending: Primary Care | Admitting: Primary Care

## 2022-06-05 ENCOUNTER — Ambulatory Visit (INDEPENDENT_AMBULATORY_CARE_PROVIDER_SITE_OTHER): Payer: BC Managed Care – PPO | Admitting: Primary Care

## 2022-06-05 VITALS — BP 106/68 | HR 60 | Temp 97.2°F | Ht 65.0 in | Wt 215.0 lb

## 2022-06-05 DIAGNOSIS — D6861 Antiphospholipid syndrome: Secondary | ICD-10-CM

## 2022-06-05 DIAGNOSIS — M25562 Pain in left knee: Secondary | ICD-10-CM

## 2022-06-05 DIAGNOSIS — Z124 Encounter for screening for malignant neoplasm of cervix: Secondary | ICD-10-CM | POA: Insufficient documentation

## 2022-06-05 DIAGNOSIS — Z0000 Encounter for general adult medical examination without abnormal findings: Secondary | ICD-10-CM | POA: Diagnosis not present

## 2022-06-05 DIAGNOSIS — Z23 Encounter for immunization: Secondary | ICD-10-CM | POA: Diagnosis not present

## 2022-06-05 DIAGNOSIS — C679 Malignant neoplasm of bladder, unspecified: Secondary | ICD-10-CM

## 2022-06-05 DIAGNOSIS — D649 Anemia, unspecified: Secondary | ICD-10-CM | POA: Diagnosis not present

## 2022-06-05 DIAGNOSIS — Z7901 Long term (current) use of anticoagulants: Secondary | ICD-10-CM

## 2022-06-05 DIAGNOSIS — Z1231 Encounter for screening mammogram for malignant neoplasm of breast: Secondary | ICD-10-CM | POA: Diagnosis not present

## 2022-06-05 DIAGNOSIS — G500 Trigeminal neuralgia: Secondary | ICD-10-CM

## 2022-06-05 DIAGNOSIS — G8929 Other chronic pain: Secondary | ICD-10-CM

## 2022-06-05 DIAGNOSIS — M25561 Pain in right knee: Secondary | ICD-10-CM

## 2022-06-05 DIAGNOSIS — R7303 Prediabetes: Secondary | ICD-10-CM

## 2022-06-05 DIAGNOSIS — K219 Gastro-esophageal reflux disease without esophagitis: Secondary | ICD-10-CM

## 2022-06-05 LAB — CBC
HCT: 35.5 % — ABNORMAL LOW (ref 36.0–46.0)
Hemoglobin: 11 g/dL — ABNORMAL LOW (ref 12.0–15.0)
MCHC: 30.9 g/dL (ref 30.0–36.0)
MCV: 74 fl — ABNORMAL LOW (ref 78.0–100.0)
Platelets: 407 10*3/uL — ABNORMAL HIGH (ref 150.0–400.0)
RBC: 4.8 Mil/uL (ref 3.87–5.11)
RDW: 19.9 % — ABNORMAL HIGH (ref 11.5–15.5)
WBC: 4.5 10*3/uL (ref 4.0–10.5)

## 2022-06-05 LAB — COMPREHENSIVE METABOLIC PANEL
ALT: 11 U/L (ref 0–35)
AST: 14 U/L (ref 0–37)
Albumin: 4 g/dL (ref 3.5–5.2)
Alkaline Phosphatase: 84 U/L (ref 39–117)
BUN: 11 mg/dL (ref 6–23)
CO2: 30 mEq/L (ref 19–32)
Calcium: 9.1 mg/dL (ref 8.4–10.5)
Chloride: 105 mEq/L (ref 96–112)
Creatinine, Ser: 0.96 mg/dL (ref 0.40–1.20)
GFR: 63.35 mL/min (ref >60.00)
Glucose, Bld: 89 mg/dL (ref 70–99)
Potassium: 4 mEq/L (ref 3.5–5.1)
Sodium: 141 mEq/L (ref 135–145)
Total Bilirubin: 0.4 mg/dL (ref 0.2–1.2)
Total Protein: 7.2 g/dL (ref 6.0–8.3)

## 2022-06-05 LAB — POCT INR: INR: 2.3 (ref 2.0–3.0)

## 2022-06-05 LAB — LIPID PANEL
Cholesterol: 184 mg/dL (ref 0–200)
HDL: 57 mg/dL (ref >39.00)
LDL Cholesterol: 96 mg/dL (ref 0–99)
NonHDL: 126.91
Total CHOL/HDL Ratio: 3
Triglycerides: 155 mg/dL — ABNORMAL HIGH (ref 0.0–149.0)
VLDL: 31 mg/dL (ref 0.0–40.0)

## 2022-06-05 LAB — HEMOGLOBIN A1C: Hgb A1c MFr Bld: 6.2 % (ref 4.6–6.5)

## 2022-06-05 NOTE — Patient Instructions (Addendum)
Stop by the lab prior to leaving today. I will notify you of your results once received.   Call to schedule your colonoscopy.  Consider meeting with Dr. Lorelei Pont for your knee.  It was a pleasure to see you today!  Preventive Care 36-63 Years Old, Female Preventive care refers to lifestyle choices and visits with your health care provider that can promote health and wellness. Preventive care visits are also called wellness exams. What can I expect for my preventive care visit? Counseling Your health care provider may ask you questions about your: Medical history, including: Past medical problems. Family medical history. Pregnancy history. Current health, including: Menstrual cycle. Method of birth control. Emotional well-being. Home life and relationship well-being. Sexual activity and sexual health. Lifestyle, including: Alcohol, nicotine or tobacco, and drug use. Access to firearms. Diet, exercise, and sleep habits. Work and work Statistician. Sunscreen use. Safety issues such as seatbelt and bike helmet use. Physical exam Your health care provider will check your: Height and weight. These may be used to calculate your BMI (body mass index). BMI is a measurement that tells if you are at a healthy weight. Waist circumference. This measures the distance around your waistline. This measurement also tells if you are at a healthy weight and may help predict your risk of certain diseases, such as type 2 diabetes and high blood pressure. Heart rate and blood pressure. Body temperature. Skin for abnormal spots. What immunizations do I need?  Vaccines are usually given at various ages, according to a schedule. Your health care provider will recommend vaccines for you based on your age, medical history, and lifestyle or other factors, such as travel or where you work. What tests do I need? Screening Your health care provider may recommend screening tests for certain conditions. This  may include: Lipid and cholesterol levels. Diabetes screening. This is done by checking your blood sugar (glucose) after you have not eaten for a while (fasting). Pelvic exam and Pap test. Hepatitis B test. Hepatitis C test. HIV (human immunodeficiency virus) test. STI (sexually transmitted infection) testing, if you are at risk. Lung cancer screening. Colorectal cancer screening. Mammogram. Talk with your health care provider about when you should start having regular mammograms. This may depend on whether you have a family history of breast cancer. BRCA-related cancer screening. This may be done if you have a family history of breast, ovarian, tubal, or peritoneal cancers. Bone density scan. This is done to screen for osteoporosis. Talk with your health care provider about your test results, treatment options, and if necessary, the need for more tests. Follow these instructions at home: Eating and drinking  Eat a diet that includes fresh fruits and vegetables, whole grains, lean protein, and low-fat dairy products. Take vitamin and mineral supplements as recommended by your health care provider. Do not drink alcohol if: Your health care provider tells you not to drink. You are pregnant, may be pregnant, or are planning to become pregnant. If you drink alcohol: Limit how much you have to 0-1 drink a day. Know how much alcohol is in your drink. In the [place] one drink equals one 12 oz bottle of beer (355 mL), one 5 oz glass of wine (148 mL), or one 1 oz glass of hard liquor (44 mL). Lifestyle Brush your teeth every morning and night with fluoride toothpaste. Floss one time each day. Exercise for at least 30 minutes 5 or more days each week. Do not use any products that contain nicotine or tobacco.  These products include cigarettes, chewing tobacco, and vaping devices, such as e-cigarettes. If you need help quitting, ask your health care provider. Do not use drugs. If you are sexually  active, practice safe sex. Use a condom or other form of protection to prevent STIs. If you do not wish to become pregnant, use a form of birth control. If you plan to become pregnant, see your health care provider for a prepregnancy visit. Take aspirin only as told by your health care provider. Make sure that you understand how much to take and what form to take. Work with your health care provider to find out whether it is safe and beneficial for you to take aspirin daily. Find healthy ways to manage stress, such as: Meditation, yoga, or listening to music. Journaling. Talking to a trusted person. Spending time with friends and family. Minimize exposure to UV radiation to reduce your risk of skin cancer. Safety Always wear your seat belt while driving or riding in a vehicle. Do not drive: If you have been drinking alcohol. Do not ride with someone who has been drinking. When you are tired or distracted. While texting. If you have been using any mind-altering substances or drugs. Wear a helmet and other protective equipment during sports activities. If you have firearms in your house, make sure you follow all gun safety procedures. Seek help if you have been physically or sexually abused. What's next? Visit your health care provider once a year for an annual wellness visit. Ask your health care provider how often you should have your eyes and teeth checked. Stay up to date on all vaccines. This information is not intended to replace advice given to you by your health care provider. Make sure you discuss any questions you have with your health care provider. Document Revised: 11/07/2020 Document Reviewed: 11/07/2020 Elsevier Patient Education  [place].

## 2022-06-05 NOTE — Progress Notes (Signed)
BCG Bladder Instillation  BCG # 1/3  Due to Bladder Cancer patient is present today for a BCG treatment. Patient was cleaned and prepped in a sterile fashion with betadine. A 14 FR catheter was inserted, urine return was noted 200 ml, urine was yellow in color.  57m of reconstituted BCG was instilled into the bladder. The catheter was then removed. Patient tolerated well, no complications were noted  Performed by: SZara Council PA-C and JGaspar Cola CMA   Follow up/ Additional notes: One week for 2/3 BCG

## 2022-06-05 NOTE — Patient Instructions (Addendum)
Pre visit review using our clinic review tool, if applicable. No additional management support is needed unless otherwise documented below in the visit note.  Increase dose today to 2 tablets and then change weekly dose to take 1 1/2 tablets except take 2 tablets on Wednesdays.  Recheck in 3 weeks.

## 2022-06-05 NOTE — Assessment & Plan Note (Addendum)
Following with Urology, office notes reviewed from November 2023. Continue BCG maintenance injections. Repeat cystoscopy pending for February 2024.  Will renew letter to work from home.

## 2022-06-05 NOTE — Assessment & Plan Note (Signed)
Controlled.  Continue Alka-Seltzer or Pepcid 20 mg PRN.

## 2022-06-05 NOTE — Progress Notes (Signed)
Subjective:    Patient ID: Theresa Lewis, female    DOB: [birth year], 62 y.o.   MRN: [id number]  HPI  Theresa Lewis is a very pleasant 62 y.o. female  has a past medical history of ABNORMAL VAGINAL BLEEDING (04/19/2007), Acute cystitis without hematuria (01/31/2020), Acute extremity pain (11/04/2019), Acute pelvic pain, female (12/04/2021), Anemia, Cancer ([place]), Complication of anesthesia, Deep venous thrombosis ([place]), GERD (gastroesophageal reflux disease), HEMORRHOIDS, WITH BLEEDING (12/26/2008), PE (pulmonary embolism) (07/09/2010), Pre-diabetes, and Pulmonary embolism ([place]). who presents today for complete physical and follow up of chronic conditions.  Immunizations: -Tetanus: Completed in 2014 -Influenza: Completed today -Shingles: Completed Zostavax, never had chicken pox.  Diet: Fair diet.  Exercise: No regular exercise.  Eye exam: Completes annually  Dental exam: Completes semi-annually   Pap Smear: Completed in December 2022, ASC-US Mammogram: Completed in March 2023  Colonoscopy: Completed in June 2020, due June 2023   BP Readings from Last 3 Encounters:  06/05/22 106/68  04/11/22 121/78  01/03/22 120/82       Review of Systems  Constitutional:  Negative for unexpected weight change.  HENT:  Negative for rhinorrhea.   Eyes:  Negative for visual disturbance.  Respiratory:  Negative for cough and shortness of breath.   Cardiovascular:  Negative for chest pain.  Gastrointestinal:  Negative for constipation and diarrhea.  Genitourinary:  Negative for difficulty urinating.  Musculoskeletal:  Positive for arthralgias.  Skin:  Negative for rash.  Allergic/Immunologic: Negative for environmental allergies.  Neurological:  Negative for dizziness and headaches.  Psychiatric/Behavioral:  The patient is not nervous/anxious.          Past Medical History:  Diagnosis Date   ABNORMAL VAGINAL BLEEDING 04/19/2007   Qualifier: Diagnosis of  By: Hulan Saas, CMA  (AAMA), Larene Beach S    Acute cystitis without hematuria 01/31/2020   Acute extremity pain 11/04/2019   Acute pelvic pain, female 12/04/2021   Anemia    h/o as a child   Cancer ([place])    Complication of anesthesia    stays asleep for a "very long time"   Deep venous thrombosis ([place])    2006    3 x clotting    GERD (gastroesophageal reflux disease)    HEMORRHOIDS, WITH BLEEDING 12/26/2008   Qualifier: Diagnosis of  By: Regis Bill MD, Standley Brooking    PE (pulmonary embolism) 07/09/2010   Pre-diabetes    Pulmonary embolism ([place])     Social History   Socioeconomic History   Marital status: Married    Spouse name: Not on file   Number of children: 1   Years of education: Not on file   Highest education level: Not on file  Occupational History   Occupation: student advocate   Tobacco Use   Smoking status: Former    Years: 4.00    Types: Cigarettes    Passive exposure: Past   Smokeless tobacco: Never  Vaping Use   Vaping Use: Never used  Substance and Sexual Activity   Alcohol use: Not Currently    Comment: rarely   Drug use: No   Sexual activity: Not on file  Other Topics Concern   Not on file  Social History Narrative   HH of 3  Daughter 15 and self.  hh of 2    No pets.   No ets.   No etoh.    Working for [employer] and [employer] .  45- 50 hours per week.  Social Determinants of Health   Financial Resource Strain: Not on file  Food Insecurity: Not on file  Transportation Needs: Not on file  Physical Activity: Not on file  Stress: Not on file  Social Connections: Not on file  Intimate Partner Violence: Not on file    Past Surgical History:  Procedure Laterality Date   MYOMECTOMY     rectal tear     TRANSURETHRAL RESECTION OF BLADDER TUMOR N/A 04/24/2020   Procedure: TRANSURETHRAL RESECTION OF BLADDER TUMOR (TURBT);  Surgeon: Abbie Sons, MD;  Location: [facility] ORS;  Service: Urology;  Laterality: N/A;   TRANSURETHRAL RESECTION OF BLADDER TUMOR N/A  05/15/2020   Procedure: TRANSURETHRAL RESECTION OF BLADDER TUMOR (TURBT);  Surgeon: Abbie Sons, MD;  Location: [facility] OR[facility];  Service: Urology;  Laterality: N/A;   TRANSURETHRAL RESECTION OF BLADDER TUMOR N/A 12/04/2020   Procedure: TRANSURETHRAL RESECTION OF BLADDER TUMOR (TURBT);  Surgeon: Abbie Sons, MD;  Location: [facility] OR[facility];  Service: Urology;  Laterality: N/A;   TUBAL LIGATION     vena caval filter      Family History  Problem Relation Age of Onset   Hypertension Mother    Rheum arthritis Mother    Lung cancer Father 22       april 12    Pancreatic cancer Father 40   Diabetes Paternal Aunt        x2   Colon cancer Neg Hx    Colon polyps Neg Hx    Kidney disease Neg Hx    Esophageal cancer Neg Hx    Gallbladder disease Neg Hx    Rectal cancer Neg Hx    Stomach cancer Neg Hx    Breast cancer Neg Hx     Allergies  Allergen Reactions   Clindamycin/Lincomycin     "unknown"   Tape Other (See Comments)    When pt has a band-aid on, the impression of band-aid stays on for a month    Current Outpatient Medications on File Prior to Visit  Medication Sig Dispense Refill   gabapentin (NEURONTIN) 100 MG capsule Take 1 capsule by mouth three times weekly as needed. 90 capsule 0   lidocaine (XYLOCAINE) 5 % ointment Apply 1 application topically as needed. 35.44 g 0   polyethylene glycol (MIRALAX / GLYCOLAX) 17 g packet Take 17 g by mouth daily as needed for moderate constipation.     warfarin (COUMADIN) 5 MG tablet Take 1 and 1/2 tablet by mouth everyday. 135 tablet 0   No current facility-administered medications on file prior to visit.    BP 106/68   Pulse 60   Temp (!) 97.2 F (36.2 C) (Temporal)   Ht '5\' 5"'$  (1.651 m)   Wt 215 lb (97.5 kg)   SpO2 92%   BMI 35.78 kg/m  Objective:   Physical Exam Exam conducted with a chaperone present.  HENT:     Right Ear: Tympanic membrane and ear canal normal.     Left Ear: Tympanic membrane and ear canal normal.     Nose:  Nose normal.  Eyes:     Conjunctiva/sclera: Conjunctivae normal.     Pupils: Pupils are equal, round, and reactive to light.  Neck:     Thyroid: No thyromegaly.  Cardiovascular:     Rate and Rhythm: Normal rate and regular rhythm.     Heart sounds: No murmur heard. Pulmonary:     Effort: Pulmonary effort is normal.     Breath sounds: Normal breath sounds.  No rales.  Abdominal:     General: Bowel sounds are normal.     Palpations: Abdomen is soft.     Tenderness: There is no abdominal tenderness.  Genitourinary:    Labia:        Right: No tenderness or lesion.        Left: No tenderness or lesion.      Vagina: No vaginal discharge or erythema.     Cervix: No cervical motion tenderness, discharge or erythema.     Uterus: Normal.      Adnexa: Right adnexa normal and left adnexa normal.  Musculoskeletal:        General: Normal range of motion.     Cervical back: Neck supple.  Lymphadenopathy:     Cervical: No cervical adenopathy.  Skin:    General: Skin is warm and dry.     Findings: No rash.  Neurological:     Mental Status: She is alert and oriented to person, place, and time.     Cranial Nerves: No cranial nerve deficit.     Deep Tendon Reflexes: Reflexes are normal and symmetric.           Assessment & Plan:  Preventative health care Assessment & Plan: Immunizations UTD. Influenza vaccine provided today. Pap smear due, completed today. Mammogram due in March, orders placed. Colonoscopy due, discussed this today. She will call to schedule.   Discussed the importance of a healthy diet and regular exercise in order for weight loss, and to reduce the risk of further co-morbidity.  Exam stable. Labs pending.  Follow up in 1 year for repeat physical.'   Urothelial carcinoma of bladder [facility]) Assessment & Plan: Following with Urology, office notes reviewed from November 2023. Continue BCG maintenance injections. Repeat cystoscopy pending for February  2024.  Will renew letter to work from home.    Antiphospholipid antibody syndrome (HCC) Assessment & Plan: Stable.  Continue warfarin per [facility].    Screening mammogram for breast cancer -     3D Screening Mammogram, Left and Right; Future  Gastroesophageal reflux disease, unspecified whether esophagitis present Assessment & Plan: Controlled.  Continue Alka-Seltzer or Pepcid 20 mg PRN.   Trigeminal neuralgia Assessment & Plan: Controlled.  Continue gabapentin 100 mg TID daily PRN.    Chronic pain of both knees Assessment & Plan: Bilaterally.  Reviewed xray of right knee from 2023 which shows osteoarthritis.  Overall stable.  Continue to monitor.    Borderline diabetes Assessment & Plan: Repeat A1C pending.  Orders: -     Lipid panel -     Hemoglobin A1c -     Comprehensive metabolic panel  Screening for cervical cancer -     Cytology - PAP  Anemia, unspecified type -     CBC        Pleas Koch, NP

## 2022-06-05 NOTE — Assessment & Plan Note (Signed)
Stable.  Continue warfarin per [facility].

## 2022-06-05 NOTE — Assessment & Plan Note (Signed)
Controlled.  Continue gabapentin 100 mg TID daily PRN.

## 2022-06-05 NOTE — Assessment & Plan Note (Signed)
Bilaterally.  Reviewed xray of right knee from 2023 which shows osteoarthritis.  Overall stable.  Continue to monitor.

## 2022-06-05 NOTE — Assessment & Plan Note (Signed)
Immunizations UTD. Influenza vaccine provided today. Pap smear due, completed today. Mammogram due in March, orders placed. Colonoscopy due, discussed this today. She will call to schedule.   Discussed the importance of a healthy diet and regular exercise in order for weight loss, and to reduce the risk of further co-morbidity.  Exam stable. Labs pending.  Follow up in 1 year for repeat physical.'

## 2022-06-05 NOTE — Progress Notes (Signed)
Increase dose today to 2 tablets and then change weekly dose to take 1 1/2 tablets except take 2 tablets on Wednesdays.   Recheck in 3 weeks.

## 2022-06-05 NOTE — Assessment & Plan Note (Signed)
Repeat A1C pending. 

## 2022-06-06 ENCOUNTER — Ambulatory Visit: Payer: BC Managed Care – PPO | Admitting: Urology

## 2022-06-06 ENCOUNTER — Other Ambulatory Visit (INDEPENDENT_AMBULATORY_CARE_PROVIDER_SITE_OTHER): Payer: BC Managed Care – PPO

## 2022-06-06 DIAGNOSIS — C679 Malignant neoplasm of bladder, unspecified: Secondary | ICD-10-CM

## 2022-06-06 DIAGNOSIS — D649 Anemia, unspecified: Secondary | ICD-10-CM | POA: Diagnosis not present

## 2022-06-06 LAB — IBC + FERRITIN
Ferritin: 6.5 ng/mL — ABNORMAL LOW (ref 10.0–291.0)
Iron: 34 ug/dL — ABNORMAL LOW (ref 42–145)
Saturation Ratios: 8.1 % — ABNORMAL LOW (ref 20.0–50.0)
TIBC: 418.6 ug/dL (ref 250.0–450.0)
Transferrin: 299 mg/dL (ref 212.0–360.0)

## 2022-06-06 LAB — URINALYSIS, COMPLETE
Bilirubin, UA: NEGATIVE
Glucose, UA: NEGATIVE
Ketones, UA: NEGATIVE
Leukocytes,UA: NEGATIVE
Nitrite, UA: NEGATIVE
RBC, UA: NEGATIVE
Specific Gravity, UA: 1.02 (ref 1.005–1.030)
Urobilinogen, Ur: 0.2 mg/dL (ref 0.2–1.0)
pH, UA: 7.5 (ref 5.0–7.5)

## 2022-06-06 LAB — MICROSCOPIC EXAMINATION: Epithelial Cells (non renal): >10 /hpf — AB (ref 0–10)

## 2022-06-06 MED ORDER — BCG LIVE 50 MG IS SUSR
3.2400 mL | Freq: Once | INTRAVESICAL | Status: AC
  Administered 2022-06-06: 81 mg via INTRAVESICAL

## 2022-06-06 NOTE — Addendum Note (Signed)
Addended by: Ellamae Sia on: 06/06/2022 07:29 AM   Modules accepted: Orders

## 2022-06-09 ENCOUNTER — Other Ambulatory Visit: Payer: Self-pay | Admitting: Primary Care

## 2022-06-09 DIAGNOSIS — K5909 Other constipation: Secondary | ICD-10-CM

## 2022-06-09 MED ORDER — POLYETHYLENE GLYCOL 3350 17 G PO PACK: 17.0000 g | PACK | Freq: Every day | ORAL | 5 refills | Status: AC | PRN

## 2022-06-09 NOTE — Telephone Encounter (Signed)
From: Theresa Lewis To: Office of Pleas Koch, NP Sent: 06/09/2022 9:15 AM EST Subject: Medication Renewal Request  Refills have been requested for the following medications:   polyethylene glycol (MIRALAX / GLYCOLAX) 17 g packet  [id number] [place] [id number] method: PArlyss Gandy

## 2022-06-10 LAB — CYTOLOGY - PAP
Comment: NEGATIVE
Diagnosis: NEGATIVE
High risk HPV: NEGATIVE

## 2022-06-11 ENCOUNTER — Ambulatory Visit (INDEPENDENT_AMBULATORY_CARE_PROVIDER_SITE_OTHER): Payer: BC Managed Care – PPO

## 2022-06-11 DIAGNOSIS — Z23 Encounter for immunization: Secondary | ICD-10-CM | POA: Diagnosis not present

## 2022-06-11 NOTE — Progress Notes (Signed)
06/12/2022 Theresa Lewis [id number] Jul 30, 1960  Referring provider: Pleas Koch, NP Primary GI doctor: Dr. Tarri Glenn  ASSESSMENT AND PLAN:   Iron deficiency anemia, unspecified iron deficiency anemia type I recommend upper gastrointestinal and colorectal evaluation with an EGD and colonoscopy.  Risk of bowel prep, conscious sedation, and EGD and colonoscopy were discussed.  Risks include but are not limited to dehydration, pain, bleeding, cardiopulmonary process, bowel perforation, or other possible adverse outcomes..  Treatment plan was discussed with patient, and agreed upon.  History of adenomatous polyp of colon 10/28/2018 colonoscopy due to rectal bleeding, recall colonoscopy 3 years  Antiphospholipid antibody syndrome  --Hold Coumadinx for 5 days before procedure - will instruct when and how to resume after procedure. Patient understands that there is a low but real risk of cardiovascular event such as heart attack, stroke, or embolism /  thrombosis, or ischemia while off Coumadin. The patient consents to proceed. Will communicate by phone or EMR with patient's prescribing provider to confirm that holding Coumadin is reasonable in this case.   Patient Care Team: Pleas Koch, NP as PCP - General (Nurse Practitioner)  HISTORY OF PRESENT ILLNESS: 62 y.o. 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   11/2014 barium swallow moderate hiatal hernia with moderate GERD, no stricture.  10/28/2018 colonoscopy due to rectal bleeding, recall colonoscopy 3 years   06/06/2022 Hgb 11, MCV 74, iron 34, ferritin 6.5 referred for hematology She got OTC iron medications  yesterday.  No family history of GI cancer, father with pancreatic and lung cancer.   Patient has a BM every day, she is on miralax daily and once a week adds a colace  Patient denies change in bowel habits, constipation, diarrhea.  Very rare hematochezia with straining but with current medications has not seen for a long time.  Patient reports GERD, was on omeprazole but stopped due to forgetting to take it. Now will take tums as needed for red sauce, does not eat after 8 pm.  She had previous dysphagia, barium 2016 showed hiatal hernia and GERD, she states she does not have dysphagia but chews her food very well.  Patient denies nausea, vomiting, melena.  Denies changes in appetite, unintentional weight loss.  Denies smoking, no ETOH, no NSAIDS.  Has been off her coumadin for recent bladder surgeries without issues, Dion Body, NP prescribes it.   She  reports that she has quit smoking. Her smoking use included cigarettes. She has been exposed to tobacco smoke. She has never used smokeless tobacco. She reports that she does not currently use alcohol. She reports that she does not use drugs.  Current Medications:       Current Outpatient Medications (Hematological):    warfarin (COUMADIN) 5 MG tablet, Take 1 and 1/2 tablet by mouth everyday.  Current Outpatient Medications (Other):    gabapentin (NEURONTIN) 100 MG capsule, Take 1 capsule by mouth three times weekly as needed.   lidocaine (XYLOCAINE) 5 % ointment, Apply 1 application topically as needed.   polyethylene glycol (MIRALAX / GLYCOLAX) 17 g packet, Take 17 g by mouth daily as needed for moderate constipation.  Medical History:  Past Medical History:  Diagnosis Date   ABNORMAL VAGINAL BLEEDING 04/19/2007   Qualifier: Diagnosis  of  By: Hulan Saas, CMA Deborra Medina), Larene Beach S    Acute cystitis without hematuria 01/31/2020   Acute extremity pain 11/04/2019   Acute pelvic pain, female 12/04/2021   Anemia    h/o as a child   Cancer  ([place])    Complication of anesthesia    stays asleep for a "very long time"   Deep venous thrombosis ([place])    2006    3 x clotting    GERD (gastroesophageal reflux disease)    HEMORRHOIDS, WITH BLEEDING 12/26/2008   Qualifier: Diagnosis of  By: Regis Bill MD, Standley Brooking    PE (pulmonary embolism) 07/09/2010   Pre-diabetes    Pulmonary embolism (HCC)    Allergies:  Allergies  Allergen Reactions   Clindamycin/Lincomycin     "unknown"   Tape Other (See Comments)    When pt has a band-aid on, the impression of band-aid stays on for a month     Surgical History:  She  has a past surgical history that includes Tubal ligation; Myomectomy; vena caval filter; rectal tear; Transurethral resection of bladder tumor (N/A, 04/24/2020); Transurethral resection of bladder tumor (N/A, 05/15/2020); and Transurethral resection of bladder tumor (N/A, 12/04/2020). Family History:  Her family history includes Diabetes in her paternal aunt; Hypertension in her mother; Lung cancer (age of onset: 2) in her father; Pancreatic cancer (age of onset: 78) in her father; Rheum arthritis in her mother.  REVIEW OF SYSTEMS  : All other systems reviewed and negative except where noted in the History of Present Illness.  PHYSICAL EXAM: BP 132/82   Pulse (!) 48   Ht '5\' 5"'$  (1.651 m)   Wt 214 lb (97.1 kg)   BMI 35.61 kg/m  General:   Pleasant, well developed female in no acute distress Head:   Normocephalic and atraumatic. Eyes:  sclerae anicteric,conjunctive pink  Heart:   regular rate and rhythm Pulm:  Clear anteriorly; no wheezing Abdomen:   Soft, Obese AB, Active bowel sounds. No tenderness , No organomegaly appreciated. Rectal: Not evaluated Extremities:  Without edema. Msk: Symmetrical without gross deformities. Peripheral pulses intact.  Neurologic:  Alert and  oriented x4;  No focal deficits.  Skin:   Dry and intact without significant lesions or rashes. Psychiatric:  Cooperative. Normal mood and  affect.  RELEVANT LABS AND IMAGING: CBC    Component Value Date/Time   WBC 4.5 06/05/2022 0812   RBC 4.80 06/05/2022 0812   HGB 11.0 (L) 06/05/2022 0812   HCT 35.5 (L) 06/05/2022 0812   PLT 407.0 (H) 06/05/2022 0812   MCV 74.0 (L) 06/05/2022 0812   MCH 22.5 (L) 12/02/2021 1324   MCHC 30.9 06/05/2022 0812   RDW 19.9 (H) 06/05/2022 0812   LYMPHSABS 1.6 03/16/2020 1308   MONOABS 0.3 03/16/2020 1308   EOSABS 0.3 03/16/2020 1308   BASOSABS 0.1 03/16/2020 1308    CMP     Component Value Date/Time   NA 141 06/05/2022 0812   K 4.0 06/05/2022 0812   CL 105 06/05/2022 0812   CO2 30 06/05/2022 0812   GLUCOSE 89 06/05/2022 0812   BUN 11 06/05/2022 0812   CREATININE 0.96 06/05/2022 0812   CALCIUM 9.1 06/05/2022 0812   PROT 7.2 06/05/2022 0812   ALBUMIN 4.0 06/05/2022 0812   AST 14 06/05/2022 0812   ALT 11 06/05/2022 0812   ALKPHOS 84 06/05/2022 0812   BILITOT 0.4 06/05/2022 0812   GFRNONAA >60 12/02/2021 1324   GFRAA >90 07/21/2011 1352     Estill Bamberg  Ernestene Kiel, PA-C 9:56 AM

## 2022-06-11 NOTE — Progress Notes (Signed)
Pneumococcal 23 vaccine given today in left deltoid. Patient tolerated injection well and VIS sheet given to patient.

## 2022-06-12 ENCOUNTER — Ambulatory Visit: Payer: BC Managed Care – PPO | Admitting: Physician Assistant

## 2022-06-12 ENCOUNTER — Telehealth: Payer: Self-pay | Admitting: *Deleted

## 2022-06-12 ENCOUNTER — Encounter: Payer: Self-pay | Admitting: Physician Assistant

## 2022-06-12 VITALS — BP 132/82 | HR 48 | Ht 65.0 in | Wt 214.0 lb

## 2022-06-12 DIAGNOSIS — D6861 Antiphospholipid syndrome: Secondary | ICD-10-CM | POA: Diagnosis not present

## 2022-06-12 DIAGNOSIS — Z86010 Personal history of colonic polyps: Secondary | ICD-10-CM

## 2022-06-12 DIAGNOSIS — D509 Iron deficiency anemia, unspecified: Secondary | ICD-10-CM | POA: Diagnosis not present

## 2022-06-12 MED ORDER — NA SULFATE-K SULFATE-MG SULF 17.5-3.13-1.6 GM/177ML PO SOLN: ORAL | 0 refills | Status: DC

## 2022-06-12 NOTE — Progress Notes (Signed)
Reviewed and agree with management plans. ? ?Shalva Rozycki L. Riniyah Speich, MD, MPH  ?

## 2022-06-12 NOTE — Telephone Encounter (Signed)
[facility] Pre-operative Risk Assessment     Request for surgical clearance:     Endoscopy Procedure  What type of surgery is being performed?     EGD/Colonoscopy  When is this surgery scheduled?     08/01/2022  What type of clearance is required ?   Pharmacy  Are there any medications that need to be held prior to surgery and how long? Coumadin 5 days   Practice name and name of physician performing surgery?      [facility]  Wylie Hail  What is your office phone and fax number?      Phone- [phone]  [phone]  Anesthesia type (None, local, MAC, general) ?       MAC

## 2022-06-12 NOTE — Progress Notes (Signed)
BCG Bladder Instillation  BCG # 2/3  Due to Bladder Cancer patient is present today for a BCG treatment. Patient was cleaned and prepped in a sterile fashion with betadine. A 14 FR catheter was inserted, urine return was noted 30 ml, urine was yellow in color.  40m of reconstituted BCG was instilled into the bladder. The catheter was then removed. Patient tolerated well, no complications were noted  Performed by: SZara Council PA-C and DSharlee Blew CMA  Follow up/ Additional notes: In one week for #3/3 BCG

## 2022-06-12 NOTE — Patient Instructions (Signed)
You have been scheduled for an endoscopy and colonoscopy. Please follow the written instructions given to you at your visit today. Please pick up your prep supplies at the pharmacy within the next 1-3 days. If you use inhalers (even only as needed), please bring them with you on the day of your procedure.   _______________________________________________________  If your blood pressure at your visit was 140/90 or greater, please contact your primary care physician to follow up on this.  _______________________________________________________  If you are age 9 or older, your body mass index should be between 23-30. Your Body mass index is 35.61 kg/m. If this is out of the aforementioned range listed, please consider follow up with your Primary Care Provider.  If you are age 61 or younger, your body mass index should be between 19-25. Your Body mass index is 35.61 kg/m. If this is out of the aformentioned range listed, please consider follow up with your Primary Care Provider.   ________________________________________________________  The [facility] providers would like to encourage you to use [facility] to communicate with providers for non-urgent requests or questions.  Due to long hold times on the telephone, sending your provider a message by [facility] may be a faster and more efficient way to get a response.  Please allow 48 business hours for a response.  Please remember that this is for non-urgent requests.  _______________________________________________________   Dennis Bast will be contacted by our office prior to your procedure for directions on holding your Coumadin.  If you do not hear from our office 1 week prior to your scheduled procedure, please call [phone] to discuss.  Due to recent changes in healthcare laws, you may see the results of your imaging and laboratory studies on MyChart before your provider has had a chance to review them.  We understand that in some cases there may be  results that are confusing or concerning to you. Not all laboratory results come back in the same time frame and the provider may be waiting for multiple results in order to interpret others.  Please give [place] 48 hours in order for your provider to thoroughly review all the results before contacting the office for clarification of your results.    Thank you for choosing [facility]   [facility]

## 2022-06-13 ENCOUNTER — Ambulatory Visit: Payer: BC Managed Care – PPO | Admitting: Urology

## 2022-06-13 DIAGNOSIS — C679 Malignant neoplasm of bladder, unspecified: Secondary | ICD-10-CM

## 2022-06-13 LAB — URINALYSIS, COMPLETE
Bilirubin, UA: NEGATIVE
Glucose, UA: NEGATIVE
Ketones, UA: NEGATIVE
Nitrite, UA: NEGATIVE
RBC, UA: NEGATIVE
Specific Gravity, UA: 1.02 (ref 1.005–1.030)
Urobilinogen, Ur: 0.2 mg/dL (ref 0.2–1.0)
pH, UA: 7.5 (ref 5.0–7.5)

## 2022-06-13 LAB — MICROSCOPIC EXAMINATION

## 2022-06-13 MED ORDER — BCG LIVE 50 MG IS SUSR
3.2400 mL | Freq: Once | INTRAVESICAL | Status: AC
  Administered 2022-06-13: 81 mg via INTRAVESICAL

## 2022-06-13 NOTE — Telephone Encounter (Signed)
This patient is not followed at [facility], we do not manage her Coumadin either. Clearance request should go to managing provider.

## 2022-06-13 NOTE — Telephone Encounter (Signed)
Preoperative team, patient is not being followed by [facility].  Please contact requesting office and let them know that recommendations for preoperative cardiac evaluation and recommendations for Coumadin will need to come from patient's cardiologist/prescribing provider.  Thank you for your help.  Jossie Ng. Cordaro Mukai NP-C     06/13/2022, 4:47 PM [place] [address] [address] Office [phone] Fax [phone]

## 2022-06-13 NOTE — Telephone Encounter (Signed)
I have faxed over pre-op clearance update to requesting surgeon's office.

## 2022-06-18 ENCOUNTER — Ambulatory Visit: Payer: BC Managed Care – PPO | Admitting: Urology

## 2022-06-18 ENCOUNTER — Encounter: Payer: Self-pay | Admitting: Urology

## 2022-06-18 VITALS — BP 141/92 | HR 67 | Wt 214.0 lb

## 2022-06-18 DIAGNOSIS — R3989 Other symptoms and signs involving the genitourinary system: Secondary | ICD-10-CM

## 2022-06-18 DIAGNOSIS — R300 Dysuria: Secondary | ICD-10-CM

## 2022-06-18 DIAGNOSIS — Z7901 Long term (current) use of anticoagulants: Secondary | ICD-10-CM

## 2022-06-18 LAB — URINALYSIS, COMPLETE
Bilirubin, UA: NEGATIVE
Glucose, UA: NEGATIVE
Nitrite, UA: NEGATIVE
Specific Gravity, UA: >1.03 — ABNORMAL HIGH (ref 1.005–1.030)
Urobilinogen, Ur: 0.2 mg/dL (ref 0.2–1.0)
pH, UA: 5.5 (ref 5.0–7.5)

## 2022-06-18 LAB — MICROSCOPIC EXAMINATION: WBC, UA: >30 /hpf — AB (ref 0–5)

## 2022-06-18 LAB — BLADDER SCAN AMB NON-IMAGING: Scan Result: 86

## 2022-06-18 MED ORDER — URIBEL 118 MG PO CAPS: 118.0000 mg | ORAL_CAPSULE | Freq: Four times a day (QID) | ORAL | 0 refills | Status: DC | PRN

## 2022-06-18 MED ORDER — CEPHALEXIN 500 MG PO CAPS: 500.0000 mg | ORAL_CAPSULE | Freq: Four times a day (QID) | ORAL | 0 refills | Status: AC

## 2022-06-18 MED ORDER — CEPHALEXIN 250 MG PO CAPS
500.0000 mg | ORAL_CAPSULE | Freq: Once | ORAL | Status: AC
  Administered 2022-06-18: 500 mg via ORAL

## 2022-06-18 NOTE — Progress Notes (Signed)
06/18/2022 4:52 PM   Theresa Lewis 06/10/1960 [id number]  Referring provider: Pleas Koch, NP [place],  [place] [address]  Urological history: 1. T1 high grade urothelial carcinoma bladder TURBT 03/2020 >6 cm papillary bladder tumor Path T1 high-grade urothelial carcinoma Restaging TURBT 04/2020; no residual tumor 6 week induction BCG completed March 2022 Multifocal recurrence cystoscopy 10/31/2020 TURBT 11/2020 Ta high-grade UCa Reinduction BCG completed 02/22/2021 3-week maintenance BCG: 10/2021 Contrast CT (11/2021) - no worrisome findings  Cysto (12/2021) - NED Urine cytology (12/2021) - negative   Chief Complaint  Patient presents with   Dysuria    HPI: Theresa Lewis is a 62 y.o. female who presents today for frequency and bladder pain.    She has been experiencing frequency, urgency and dysuria and bladder pain since her BCG treatment on Friday.  She has not been able to get any sleep and she has not consumed a lot of fluid as she was trying to avoid urinating because it was so painful.  She tried soaking in the tub and that did not treat her symptoms.  Patient denies any modifying or aggravating factors.  Patient denies any suprapubic/flank pain.  Patient denies any fevers, chills, nausea or vomiting.    UA yellow slightly cloudy, ketones trace, specific gravity greater than 1.030, 1+ blood, pH 5.5, 3+ protein, 2+ leukocytes, greater than 30 WBCs, 3-10 RBCs, 0-10 epithelial cells and moderate bacteria.  PVR 82 mL    PMH: Past Medical History:  Diagnosis Date   ABNORMAL VAGINAL BLEEDING 04/19/2007   Qualifier: Diagnosis of  By: Hulan Saas, CMA (AAMA), Larene Beach S    Acute cystitis without hematuria 01/31/2020   Acute extremity pain 11/04/2019   Acute pelvic pain, female 12/04/2021   Anemia    h/o as a child   Cancer ([place])    Complication of anesthesia    stays asleep for a "very long time"   Deep venous thrombosis ([place])    2006    3  x clotting    GERD (gastroesophageal reflux disease)    HEMORRHOIDS, WITH BLEEDING 12/26/2008   Qualifier: Diagnosis of  By: Regis Bill MD, Standley Brooking    PE (pulmonary embolism) 07/09/2010   Pre-diabetes    Pulmonary embolism ([place])     Surgical History: Past Surgical History:  Procedure Laterality Date   MYOMECTOMY     rectal tear     TRANSURETHRAL RESECTION OF BLADDER TUMOR N/A 04/24/2020   Procedure: TRANSURETHRAL RESECTION OF BLADDER TUMOR (TURBT);  Surgeon: Abbie Sons, MD;  Location: [facility] ORS;  Service: Urology;  Laterality: N/A;   TRANSURETHRAL RESECTION OF BLADDER TUMOR N/A 05/15/2020   Procedure: TRANSURETHRAL RESECTION OF BLADDER TUMOR (TURBT);  Surgeon: Abbie Sons, MD;  Location: [facility] ORS;  Service: Urology;  Laterality: N/A;   TRANSURETHRAL RESECTION OF BLADDER TUMOR N/A 12/04/2020   Procedure: TRANSURETHRAL RESECTION OF BLADDER TUMOR (TURBT);  Surgeon: Abbie Sons, MD;  Location: [facility] ORS;  Service: Urology;  Laterality: N/A;   TUBAL LIGATION     vena caval filter      Home Medications:  Allergies as of 06/18/2022       Reactions   Clindamycin/lincomycin    "unknown"   Tape Other (See Comments)   When pt has a band-aid on, the impression of band-aid stays on for a month        Medication List        Accurate as of June 18, 2022  4:52 PM. If you have any questions, ask your nurse or doctor.          cephALEXin 500 MG capsule Commonly known as: KEFLEX Take 1 capsule (500 mg total) by mouth 4 (four) times daily for 10 days.   gabapentin 100 MG capsule Commonly known as: NEURONTIN Take 1 capsule by mouth three times weekly as needed.   lidocaine 5 % ointment Commonly known as: XYLOCAINE Apply 1 application topically as needed.   Na Sulfate-K Sulfate-Mg Sulf 17.5-3.13-1.6 GM/177ML Soln 1 colonscopy prep per GI office instructions   polyethylene glycol 17 g packet Commonly known as: MIRALAX / GLYCOLAX Take 17 g by mouth daily as needed  for moderate constipation.   Uribel 118 MG Caps Take 1 capsule (118 mg total) by mouth every 6 (six) hours as needed.   warfarin 5 MG tablet Commonly known as: COUMADIN Take as directed by the anticoagulation clinic. If you are unsure how to take this medication, talk to your nurse or doctor. Original instructions: Take 1 and 1/2 tablet by mouth everyday.        Allergies:  Allergies  Allergen Reactions   Clindamycin/Lincomycin     "unknown"   Tape Other (See Comments)    When pt has a band-aid on, the impression of band-aid stays on for a month    Family History: Family History  Problem Relation Age of Onset   Hypertension Mother    Rheum arthritis Mother    Lung cancer Father 75       april 12    Pancreatic cancer Father 35   Diabetes Paternal Aunt        x2   Colon cancer Neg Hx    Colon polyps Neg Hx    Kidney disease Neg Hx    Esophageal cancer Neg Hx    Gallbladder disease Neg Hx    Rectal cancer Neg Hx    Stomach cancer Neg Hx    Breast cancer Neg Hx     Social History:  reports that she has quit smoking. Her smoking use included cigarettes. She has been exposed to tobacco smoke. She has never used smokeless tobacco. She reports that she does not currently use alcohol. She reports that she does not use drugs.  ROS: Pertinent ROS in HPI  Physical Exam: BP (!) 141/92   Pulse 67   Wt 214 lb (97.1 kg)   BMI 35.61 kg/m   Constitutional:  Well nourished. Alert and oriented, No acute distress. HEENT: [place] AT, moist mucus membranes.  Trachea midline, no masses. Cardiovascular: No clubbing, cyanosis, or edema. Respiratory: Normal respiratory effort, no increased work of breathing. GU: No CVA tenderness.  Bladder tender to palpation.  Normal external genitalia, normal pubic hair distribution, no lesions.  Normal urethral meatus, no lesions, no prolapse, no discharge.   No urethral masses, tenderness and/or tenderness. No bladder fullness, tenderness or masses.  Pale vagina mucosa, fair estrogen effect, no discharge, no lesions, fair pelvic support, grade I cystocele and no rectocele noted.  Anus and perineum are without rashes or lesions.    Neurologic: Grossly intact, no focal deficits, moving all 4 extremities. Psychiatric: Normal mood and affect.    Laboratory Data: Lab Results  Component Value Date   WBC 4.5 06/05/2022   HGB 11.0 (L) 06/05/2022   HCT 35.5 (L) 06/05/2022   MCV 74.0 (L) 06/05/2022   PLT 407.0 (H) 06/05/2022    Lab Results  Component Value Date   CREATININE 0.96 06/05/2022  Lab Results  Component Value Date   HGBA1C 6.2 06/05/2022       Component Value Date/Time   CHOL 184 06/05/2022 0812   HDL 57.00 06/05/2022 0812   CHOLHDL 3 06/05/2022 0812   VLDL 31.0 06/05/2022 0812   LDLCALC 96 06/05/2022 0812    Lab Results  Component Value Date   AST 14 06/05/2022   Lab Results  Component Value Date   ALT 11 06/05/2022    Urinalysis See EPIC and HPI I have reviewed the labs.   Pertinent Imaging:  06/18/22 14:31  Scan Result 86 ml    Assessment & Plan:    1. Bladder pain  -UA suspicious for infection -Urine sent for culture -Patient prescribed Keflex 500 mg 4 times daily, she is given 500 mg tablet of Keflex here in the office -I have also prescribed Uribel, up to 4 times daily prn for the bladder pain -I also instilled lidocaine into her urethra for symptom relief and that was effective -I offered her a repeat instillation tomorrow if she is still having significant symptoms  Return for pending urine culture results .  These notes generated with voice recognition software. I apologize for typographical errors.  [place], [place] [address]  [place], [place] [address] [phone]

## 2022-06-19 ENCOUNTER — Ambulatory Visit
Admission: RE | Admit: 2022-06-19 | Discharge: 2022-06-19 | Disposition: A | Discharge status: Home / Self Care | Payer: BC Managed Care – PPO | Source: Ambulatory Visit | Attending: Urology | Admitting: Urology

## 2022-06-19 ENCOUNTER — Ambulatory Visit (INDEPENDENT_AMBULATORY_CARE_PROVIDER_SITE_OTHER): Payer: BC Managed Care – PPO | Admitting: Urology

## 2022-06-19 DIAGNOSIS — Z7901 Long term (current) use of anticoagulants: Secondary | ICD-10-CM

## 2022-06-19 DIAGNOSIS — R1030 Lower abdominal pain, unspecified: Secondary | ICD-10-CM | POA: Diagnosis not present

## 2022-06-19 DIAGNOSIS — N3289 Other specified disorders of bladder: Secondary | ICD-10-CM | POA: Diagnosis not present

## 2022-06-19 MED ORDER — GEMTESA 75 MG PO TABS: 75.0000 mg | ORAL_TABLET | Freq: Every day | ORAL | 0 refills | Status: DC

## 2022-06-19 MED ORDER — KETOROLAC TROMETHAMINE 60 MG/2ML IM SOLN
60.0000 mg | Freq: Once | INTRAMUSCULAR | Status: AC
  Administered 2022-06-19: 60 mg via INTRAMUSCULAR

## 2022-06-19 MED ORDER — OXYCODONE HCL 5 MG PO CAPS: 5.0000 mg | ORAL_CAPSULE | ORAL | 0 refills | Status: DC | PRN

## 2022-06-19 NOTE — Progress Notes (Signed)
06/18/2022 4:52 PM   Theresa Lewis [birth year] [id number]  Referring provider: Pleas Koch, NP [place],  [place] [address]  Urological history: 1. T1 high grade urothelial carcinoma bladder TURBT 03/2020 >6 cm papillary bladder tumor Path T1 high-grade urothelial carcinoma Restaging TURBT 04/2020; no residual tumor 6 week induction BCG completed March 2022 Multifocal recurrence cystoscopy 10/31/2020 TURBT 11/2020 Ta high-grade UCa Reinduction BCG completed 02/22/2021 3-week maintenance BCG: 10/2021 Contrast CT (11/2021) - no worrisome findings  Cysto (12/2021) - NED Urine cytology (12/2021) - negative   Chief Complaint  Patient presents with   Dysuria    HPI: Theresa Lewis is a 62 y.o. female who presents today for frequency and bladder pain.    At her visit yesterday, she had been experiencing frequency, urgency and dysuria and bladder pain since her BCG treatment on Friday.  She had not been able to get any sleep and she had not consumed a lot of fluid as she was trying to avoid urinating because it was so painful.  She tried soaking in the tub and that did not treat her symptoms.  UA yellow slightly cloudy, ketones trace, specific gravity greater than 1.030, 1+ blood, pH 5.5, 3+ protein, 2+ leukocytes, greater than 30 WBCs, 3-10 RBCs, 0-10 epithelial cells and moderate bacteria.  PVR 82 mL.  Urine culture pending.  I instilled 2% lidocaine gel into her urethra and that gave her relief.  I prescribed Keflex 500 mg 4 times daily and she was given a tablet in the office.  I also prescribed Uribel, but unfortunately the pharmacy had to order the medication.  She states the pain abated after I instilled the lidocaine gel, but she still had frequency and urgency.  She took gabapentin last night in order to sleep.  When she was standing in the kitchen making a sandwich for lunch, the pain suddenly returned.  She describes the sensation as her urethra is  starting to fill up with urine and then in a very intense spasm as the urine is pushed out of her bladder and then intense pain in the bladder area after voiding that last for several minutes.  This is going along with her urgency to urinate every 20 to 30 minutes.    Patient denies any modifying or aggravating factors.  Patient denies any gross hematuria, dysuria or suprapubic/flank pain.  Patient denies any fevers, chills, nausea or vomiting.    STAT CT renal stone study -did not identify any urinary stones or other etiology for her pain.   PMH: Past Medical History:  Diagnosis Date   ABNORMAL VAGINAL BLEEDING 04/19/2007   Qualifier: Diagnosis of  By: Hulan Saas, CMA (AAMA), Larene Beach S    Acute cystitis without hematuria 01/31/2020   Acute extremity pain 11/04/2019   Acute pelvic pain, female 12/04/2021   Anemia    h/o as a child   Cancer ([place])    Complication of anesthesia    stays asleep for a "very long time"   Deep venous thrombosis ([place])    2006    3 x clotting    GERD (gastroesophageal reflux disease)    HEMORRHOIDS, WITH BLEEDING 12/26/2008   Qualifier: Diagnosis of  By: Regis Bill MD, Standley Brooking    PE (pulmonary embolism) 07/09/2010   Pre-diabetes    Pulmonary embolism [facility])     Surgical History: Past Surgical History:  Procedure Laterality Date   MYOMECTOMY     rectal tear  TRANSURETHRAL RESECTION OF BLADDER TUMOR N/A 04/24/2020   Procedure: TRANSURETHRAL RESECTION OF BLADDER TUMOR (TURBT);  Surgeon: Abbie Sons, MD;  Location: [facility] ORS;  Service: Urology;  Laterality: N/A;   TRANSURETHRAL RESECTION OF BLADDER TUMOR N/A 05/15/2020   Procedure: TRANSURETHRAL RESECTION OF BLADDER TUMOR (TURBT);  Surgeon: Abbie Sons, MD;  Location: [facility] ORS;  Service: Urology;  Laterality: N/A;   TRANSURETHRAL RESECTION OF BLADDER TUMOR N/A 12/04/2020   Procedure: TRANSURETHRAL RESECTION OF BLADDER TUMOR (TURBT);  Surgeon: Abbie Sons, MD;  Location: [facility] ORS;  Service:  Urology;  Laterality: N/A;   TUBAL LIGATION     vena caval filter      Home Medications:  Allergies as of 06/18/2022       Reactions   Clindamycin/lincomycin    "unknown"   Tape Other (See Comments)   When pt has a band-aid on, the impression of band-aid stays on for a month        Medication List        Accurate as of June 18, 2022  4:52 PM. If you have any questions, ask your nurse or doctor.          cephALEXin 500 MG capsule Commonly known as: KEFLEX Take 1 capsule (500 mg total) by mouth 4 (four) times daily for 10 days.   gabapentin 100 MG capsule Commonly known as: NEURONTIN Take 1 capsule by mouth three times weekly as needed.   lidocaine 5 % ointment Commonly known as: XYLOCAINE Apply 1 application topically as needed.   Na Sulfate-K Sulfate-Mg Sulf 17.5-3.13-1.6 GM/177ML Soln 1 colonscopy prep per GI office instructions   polyethylene glycol 17 g packet Commonly known as: MIRALAX / GLYCOLAX Take 17 g by mouth daily as needed for moderate constipation.   Uribel 118 MG Caps Take 1 capsule (118 mg total) by mouth every 6 (six) hours as needed.   warfarin 5 MG tablet Commonly known as: COUMADIN Take as directed by the anticoagulation clinic. If you are unsure how to take this medication, talk to your nurse or doctor. Original instructions: Take 1 and 1/2 tablet by mouth everyday.        Allergies:  Allergies  Allergen Reactions   Clindamycin/Lincomycin     "unknown"   Tape Other (See Comments)    When pt has a band-aid on, the impression of band-aid stays on for a month    Family History: Family History  Problem Relation Age of Onset   Hypertension Mother    Rheum arthritis Mother    Lung cancer Father 30       april 12    Pancreatic cancer Father 41   Diabetes Paternal Aunt        x2   Colon cancer Neg Hx    Colon polyps Neg Hx    Kidney disease Neg Hx    Esophageal cancer Neg Hx    Gallbladder disease Neg Hx    Rectal cancer  Neg Hx    Stomach cancer Neg Hx    Breast cancer Neg Hx     Social History:  reports that she has quit smoking. Her smoking use included cigarettes. She has been exposed to tobacco smoke. She has never used smokeless tobacco. She reports that she does not currently use alcohol. She reports that she does not use drugs.  ROS: Pertinent ROS in HPI  Physical Exam: BP (!) 141/92   Pulse 67   Wt 214 lb (97.1 kg)   BMI 35.61  kg/m   Constitutional:  Well nourished. Alert and oriented, No acute distress. HEENT: [place] AT, moist mucus membranes.  Trachea midline Cardiovascular: No clubbing, cyanosis, or edema. Respiratory: Normal respiratory effort, no increased work of breathing. Neurologic: Grossly intact, no focal deficits, moving all 4 extremities. Psychiatric: Normal mood and affect.    Laboratory Data: N/A    Pertinent Imaging: Narrative & Impression  CLINICAL DATA:  Abdominal and flank pain, stone suspected, history of bladder cancer status post TURBT * Tracking Code: BO *   EXAM: CT ABDOMEN AND PELVIS WITHOUT CONTRAST   TECHNIQUE: Multidetector CT imaging of the abdomen and pelvis was performed following the standard protocol without IV contrast.   RADIATION DOSE REDUCTION: This exam was performed according to the departmental dose-optimization program which includes automated exposure control, adjustment of the mA and/or kV according to patient size and/or use of iterative reconstruction technique.   COMPARISON:  12/02/2021   FINDINGS: Lower chest: No acute abnormality. Moderate hiatal hernia with intrathoracic position of the gastric fundus.   Hepatobiliary: No solid liver abnormality is seen. No gallstones, gallbladder wall thickening, or biliary dilatation.   Pancreas: Unremarkable. No pancreatic ductal dilatation or surrounding inflammatory changes.   Spleen: Normal in size without significant abnormality.   Adrenals/Urinary Tract: Adrenal glands are  unremarkable. Kidneys are normal, without renal calculi, solid lesion, or hydronephrosis. Decompressed urinary bladder with severe wall thickening and fat stranding (series 6, image 67). Small focus of air within the bladder most likely related to recent catheterization.   Stomach/Bowel: Stomach is within normal limits. Appendix appears normal. No evidence of bowel wall thickening, distention, or inflammatory changes. Cecal diverticula.   Vascular/Lymphatic: Infrarenal IVC filter. No enlarged abdominal or pelvic lymph nodes.   Reproductive: No mass or other significant abnormality.   Other: No abdominal wall hernia or abnormality. No ascites.   Musculoskeletal: No acute or significant osseous findings.   IMPRESSION: 1. Decompressed urinary bladder with severe wall thickening and fat stranding, consistent with nonspecific infectious or inflammatory cystitis. Correlate with urinalysis. 2. No obvious bladder mass, lymphadenopathy, or metastatic disease in the abdomen or pelvis on noncontrast CT. 3. No urinary tract calculi or hydronephrosis. 4. Moderate hiatal hernia. 5. Cecal diverticulosis without evidence of acute diverticulitis.   These results will be called to the ordering clinician or representative by the Radiologist Assistant, and communication documented in the PACS or [employer].     Electronically Signed   By: Delanna Ahmadi M.D.   On: 06/19/2022 14:55  I have independently reviewed the films.  See HPI.     Assessment & Plan:    1. Bladder pain  -UA suspicious for infection -Urine culture still pending -2% lidocaine instilled within urethra  -Toradol 60 mg IM given in the office  -continue Keflex 500 mg 4 times daily -start Uribel, up to 4 times daily prn for the bladder pain -I gave her Gemtesa 75 mg, #42, samples to take daily for spasms -Sent a prescription in for oxycodone IR 5 mg every 4 hours as needed for pain #10 -I offered her a repeat  instillation of Lidocaine tomorrow if she is still having significant symptoms  Return for pending urine culture results .  These notes generated with voice recognition software. I apologize for typographical errors.  [place], [place] [address]  [place], [place] [address] [phone]

## 2022-06-20 ENCOUNTER — Telehealth: Payer: Self-pay | Admitting: Family Medicine

## 2022-06-20 ENCOUNTER — Ambulatory Visit: Payer: BC Managed Care – PPO | Admitting: Urology

## 2022-06-20 LAB — CULTURE, URINE COMPREHENSIVE

## 2022-06-20 NOTE — Telephone Encounter (Signed)
-----  Message from Nori Riis, PA-C sent at 06/20/2022  1:59 PM EST ----- Please let Mrs Ra know that her urine culture was negative, but I would still continue the Keflex.

## 2022-06-20 NOTE — Telephone Encounter (Signed)
Patient notified and voiced understanding.

## 2022-06-20 NOTE — Telephone Encounter (Signed)
I tried to call her this am.  Unfortunately, I will not be here this afternoon.  Is it the Uribel the pharmacy cannot get?  They told us yesterday that they would have it available for her to pick it up yesterday.  We can prescribe Pyridium 200 mg three times daily.   I spoke to patient and informed her that I called all pharmacies in [place] and none have Uribel in [place]. I told them to get the Maximum strength over the counter AZO and offered for them to come in for the Lidocaine jelly. They did not want to do the Jelly, they will pick up medicine. She said she will call Monday if she needs anything.

## 2022-06-22 ENCOUNTER — Encounter: Payer: Self-pay | Admitting: Urology

## 2022-06-22 ENCOUNTER — Other Ambulatory Visit: Payer: Self-pay | Admitting: Primary Care

## 2022-06-22 DIAGNOSIS — G500 Trigeminal neuralgia: Secondary | ICD-10-CM

## 2022-06-22 MED ORDER — GABAPENTIN 100 MG PO CAPS: ORAL_CAPSULE | ORAL | 0 refills | Status: DC

## 2022-06-22 NOTE — Telephone Encounter (Signed)
From: [name] To: Office of Pleas Koch, NP Sent: 06/20/2022 7:36 PM EST Subject: Medication Renewal Request  Refills have been requested for the following medications:   gabapentin (NEURONTIN) 100 MG capsule [Talyn Eddie K Jaevion Goto]  Preferred pharmacy: [id number] [place], [place] - [address] Delivery method: PArlyss Gandy

## 2022-06-23 ENCOUNTER — Telehealth: Payer: Self-pay | Admitting: Urology

## 2022-06-23 ENCOUNTER — Other Ambulatory Visit: Payer: Self-pay | Admitting: Urology

## 2022-06-23 ENCOUNTER — Other Ambulatory Visit: Payer: BC Managed Care – PPO

## 2022-06-23 DIAGNOSIS — N3289 Other specified disorders of bladder: Secondary | ICD-10-CM

## 2022-06-23 DIAGNOSIS — R102 Pelvic and perineal pain: Secondary | ICD-10-CM

## 2022-06-23 MED ORDER — DIAZEPAM 2.5 MG RE GEL: 2.5000 mg | Freq: Once | RECTAL | 0 refills | Status: DC

## 2022-06-23 MED ORDER — HYOSCYAMINE SULFATE 0.125 MG PO TABS: 0.1250 mg | ORAL_TABLET | ORAL | 0 refills | Status: DC | PRN

## 2022-06-23 NOTE — Addendum Note (Signed)
Addended by: Zara Council A on: 06/23/2022 10:59 AM   Modules accepted: Orders

## 2022-06-23 NOTE — Telephone Encounter (Signed)
Please call patient:  Will you clarify the Rx with patient? Three times weekly is what I thought she was doing. Pharmacy is asking.

## 2022-06-23 NOTE — Telephone Encounter (Signed)
We also discussed the possibility that this may be a gynecological issue as well.  We would like to find a reason for her to have this symptomatology so that she can continue with the BCG treatments because at this point she would be very reluctant to have further treatments.  I will refer her to gynecology for further evaluation.

## 2022-06-23 NOTE — Telephone Encounter (Signed)
Reviewing her records and she had a maintenance BCG that was completed on 11/12/2021 and she started having the urethral pain, frequency and urgency on 12/02/2021.  She was seen in the ED.

## 2022-06-23 NOTE — Progress Notes (Signed)
I spoke with Mrs. Theresa Lewis this morning and she states that her pain did not abate over the weekend.  She continues to go every 30 minutes with intense bladder/urethral pain.  She states was not able to get the Uribel as it is out of stock everywhere contrary to what the pharmacy told us.  She states the oxycodone, Gemtesa and Keflex have not been helpful.  She states she has been taking Pyridium which is also not helpful.  I sent a prescription in for 2.5 mg Valiumrectal suppository to see if that will calm her bladder down.  She also told me that she had a similar phenomenon about 5 or 6 months ago where she was seen in the ED and the medicine that she was given in the ED seem to be helpful.  Looking back at the notes, she visited the emergency department on December 02, 2021 and was given an injection of morphine, Zofran and Percocet.

## 2022-06-24 NOTE — Telephone Encounter (Signed)
Noted  

## 2022-06-24 NOTE — Telephone Encounter (Signed)
Spoke with patient. Patient states she started Hyoscamine yesterday 06/23/22-she has been taking it every 4 hours when she is awake. Spasms are better some, not as severe as when she came to see [place]. She does have to sit on a heating pad after urinating. She also states that she knows that Larene Beach was working on gyn referral for her but she does not feel like it is related to her vagina issue, the pain comes on only when she goes to urinate.

## 2022-06-24 NOTE — Telephone Encounter (Signed)
Called and spoke to patient, she states she takes gabapentin 3-4 times weekly.

## 2022-06-25 ENCOUNTER — Telehealth: Payer: Self-pay

## 2022-06-25 NOTE — Telephone Encounter (Signed)
Pt reports she has been in severe pain from UTI since 1/19. Pt received BCG treatment on 1/25 and OV with urology on 1/25 when they tested for a UTI. Culture came back negative but urology advised to continue abx until finished.  Prescribed keflex, oxycodone and uribel, and given injections of zofran and toradol at urology OV on 1/25. Uribel had to be cancelled due to national shortage and diastat was ordered to help with spasms.  Pt reports oxycodone and diastat, are not helping at all and the only medication that helps with the pain is ibuprofen. She is aware this interacts with warfarin. She has apt for INR check in coumadin clinic tomorrow but would like to cancel the apt due to the pain.  Advised pt if she has to take ibuprofen, to decrease her warfarin dose from 55 mg to 47.5 mg weekly. Advised to take 7.5 mg daily except take 5 mg on Mondays and Fridays. Advised to watch for s/s of abnormal bruising or bleeding and s/s of clots. Advised if she had any symptoms to contact office or go to the ER. Cancelled coumadin clinic apt for tomorrow. Advised pt to use ibuprofen sparingly and if pain worsened to contact urology for further instructions. Pt is to contact coumadin clinic as soon as she feels she can make an apt to check INR. Pt verbalized understanding. Advised a msg would be sent to PCP to update. Pt appreciative.

## 2022-06-25 NOTE — Telephone Encounter (Signed)
Noted and very sorry to hear this. I am comfortable with the plan. She has a Urology appointment scheduled for this coming Friday.

## 2022-06-26 ENCOUNTER — Other Ambulatory Visit: Payer: Self-pay | Admitting: Urology

## 2022-06-26 ENCOUNTER — Ambulatory Visit: Payer: BC Managed Care – PPO

## 2022-06-26 DIAGNOSIS — N3289 Other specified disorders of bladder: Secondary | ICD-10-CM

## 2022-06-26 MED ORDER — DIAZEPAM 2.5 MG RE GEL: 2.5000 mg | Freq: Once | RECTAL | 0 refills | Status: DC

## 2022-06-26 NOTE — Telephone Encounter (Signed)
Routing to Coumadin RN for input. I am okay if she comes off of Coumadin for procedure but she will need to be off for the shortest duration possible.   Larene Beach, can you assist?

## 2022-06-26 NOTE — Telephone Encounter (Signed)
To Alma Friendly NP  Please advise if patient can come off coumdin for upcoming Endoscopy on 08/01/2022  Thank You Genella Mech

## 2022-06-27 ENCOUNTER — Ambulatory Visit: Payer: BC Managed Care – PPO | Admitting: Urology

## 2022-06-27 NOTE — Telephone Encounter (Signed)
Noted and appreciate Larene Beach, RN advice. Will evaluate as time gets closer.

## 2022-06-27 NOTE — Telephone Encounter (Signed)
Pt scheduled for EGD/Colonoscopy on 08/01/22. 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  Does not meet criteria for lovenox bridge. Five day hold for procedure is appropriate with warfarin restarted as soon as possible after procedure.   Unable to provide a warfarin dosing schedule until closer to the procedure in order to use current warfarin dosing at that time and provide booster dosing instructions following procedure.   With procedure scheduled for 3/8, pt would take last dose of warfarin on 3/3. Will recommend warfarin dosing schedule closer to procedure date.

## 2022-06-30 ENCOUNTER — Other Ambulatory Visit: Payer: Self-pay | Admitting: Urology

## 2022-06-30 DIAGNOSIS — N3289 Other specified disorders of bladder: Secondary | ICD-10-CM

## 2022-06-30 MED ORDER — DIAZEPAM 2.5 MG RE GEL: 2.5000 mg | Freq: Once | RECTAL | 1 refills | Status: DC

## 2022-07-02 ENCOUNTER — Other Ambulatory Visit: Payer: Self-pay | Admitting: Family Medicine

## 2022-07-02 MED ORDER — DOXYCYCLINE HYCLATE 100 MG PO CAPS: 100.0000 mg | ORAL_CAPSULE | Freq: Two times a day (BID) | ORAL | 0 refills | Status: DC

## 2022-07-03 ENCOUNTER — Encounter: Payer: Self-pay | Admitting: Certified Nurse Midwife

## 2022-07-03 ENCOUNTER — Other Ambulatory Visit (HOSPITAL_COMMUNITY)
Admission: RE | Admit: 2022-07-03 | Discharge: 2022-07-03 | Disposition: A | Discharge status: Home / Self Care | Payer: BC Managed Care – PPO | Source: Ambulatory Visit | Attending: Obstetrics and Gynecology | Admitting: Obstetrics and Gynecology

## 2022-07-03 ENCOUNTER — Other Ambulatory Visit: Payer: Self-pay | Admitting: Primary Care

## 2022-07-03 ENCOUNTER — Ambulatory Visit: Payer: BC Managed Care – PPO | Admitting: Certified Nurse Midwife

## 2022-07-03 ENCOUNTER — Telehealth: Payer: Self-pay | Admitting: Urology

## 2022-07-03 ENCOUNTER — Other Ambulatory Visit: Payer: Self-pay | Admitting: Urology

## 2022-07-03 VITALS — BP 140/86 | HR 57 | Resp 16 | Ht 65.0 in | Wt 210.4 lb

## 2022-07-03 DIAGNOSIS — N3289 Other specified disorders of bladder: Secondary | ICD-10-CM

## 2022-07-03 DIAGNOSIS — G500 Trigeminal neuralgia: Secondary | ICD-10-CM

## 2022-07-03 DIAGNOSIS — R102 Pelvic and perineal pain: Secondary | ICD-10-CM | POA: Diagnosis not present

## 2022-07-03 MED ORDER — ESTRADIOL 0.1 MG/GM VA CREA: 1.0000 | TOPICAL_CREAM | Freq: Every day | VAGINAL | 12 refills | Status: DC

## 2022-07-03 MED ORDER — OXYCODONE-ACETAMINOPHEN 5-325 MG PO TABS: 1.0000 | ORAL_TABLET | Freq: Four times a day (QID) | ORAL | 0 refills | Status: DC | PRN

## 2022-07-03 MED ORDER — GABAPENTIN 100 MG PO CAPS: 200.0000 mg | ORAL_CAPSULE | Freq: Three times a day (TID) | ORAL | 0 refills | Status: AC

## 2022-07-03 MED ORDER — DIAZEPAM 2.5 MG RE GEL: 2.5000 mg | Freq: Once | RECTAL | 1 refills | Status: DC

## 2022-07-03 NOTE — Telephone Encounter (Signed)
Spoke to patient and she agrees with the referral.

## 2022-07-03 NOTE — Telephone Encounter (Signed)
From: [name] To: Office of Pleas Koch, NP Sent: 07/02/2022 4:58 PM EST Subject: Medication Renewal Request  Refills have been requested for the following medications:   gabapentin (NEURONTIN) 100 MG capsule [Maahi Lannan K Larrell Rapozo]  Preferred pharmacy: [id number] [place] [place] [address] method: PArlyss Gandy

## 2022-07-03 NOTE — Telephone Encounter (Signed)
I saw that she was seen by gynecology and they ruled out any gynecological etiology for her pain.  I have discussed this with Dr. Bernardo Heater and we would like to refer her to Dr. Lawrence Santiago at [facility].  He specializes in bladder pain.

## 2022-07-03 NOTE — Progress Notes (Signed)
GYN ENCOUNTER NOTE  Subjective:       Theresa Lewis is a 62 y.o. No obstetric history on file. female is here for gynecologic evaluation of the following issues:  1. Vagina pain that started approximately 3 weeks ago after having follow up chemo treatment. Pt states that a catheter was placed in her bladder and that she had already emptied so there was no urine  the person that was placing the catheter pulled the catheter in and out several times in an attempt to get urine. Since then the pt has experienced stabbing knife like pain in her vagina/urethra.  Pt is very tearful and describes this pain as being debilitating for her.    Gynecologic History No LMP recorded. Patient is postmenopausal. Contraception: post menopausal status Last Pap: 06/05/2022. Results were: normal/negative HPV Last mammogram: 07/24/2021. Results were: normal  Obstetric History OB History  No obstetric history on file.    Past Medical History:  Diagnosis Date   ABNORMAL VAGINAL BLEEDING 04/19/2007   Qualifier: Diagnosis of  By: Hulan Saas, CMA (AAMA), Larene Beach S    Acute cystitis without hematuria 01/31/2020   Acute extremity pain 11/04/2019   Acute pelvic pain, female 12/04/2021   Anemia    h/o as a child   Cancer ([place])    Complication of anesthesia    stays asleep for a "very long time"   Deep venous thrombosis ([place])    2006    3 x clotting    GERD (gastroesophageal reflux disease)    HEMORRHOIDS, WITH BLEEDING 12/26/2008   Qualifier: Diagnosis of  By: Regis Bill MD, Standley Brooking    PE (pulmonary embolism) 07/09/2010   Pre-diabetes    Pulmonary embolism ([place])   -bladder cancer   Past Surgical History:  Procedure Laterality Date   MYOMECTOMY     rectal tear     TRANSURETHRAL RESECTION OF BLADDER TUMOR N/A 04/24/2020   Procedure: TRANSURETHRAL RESECTION OF BLADDER TUMOR (TURBT);  Surgeon: Abbie Sons, MD;  Location: [facility] OR[facility];  Service: Urology;  Laterality: N/A;   TRANSURETHRAL RESECTION OF BLADDER  TUMOR N/A 05/15/2020   Procedure: TRANSURETHRAL RESECTION OF BLADDER TUMOR (TURBT);  Surgeon: Abbie Sons, MD;  Location: [facility] OR[facility];  Service: Urology;  Laterality: N/A;   TRANSURETHRAL RESECTION OF BLADDER TUMOR N/A 12/04/2020   Procedure: TRANSURETHRAL RESECTION OF BLADDER TUMOR (TURBT);  Surgeon: Abbie Sons, MD;  Location: [facility];  Service: Urology;  Laterality: N/A;   TUBAL LIGATION     vena caval filter      Current Outpatient Medications on File Prior to Visit  Medication Sig Dispense Refill   doxycycline (VIBRAMYCIN) 100 MG capsule Take 1 capsule (100 mg total) by mouth every 12 (twelve) hours. 14 capsule 0   gabapentin (NEURONTIN) 100 MG capsule TAKE 1 CAPSULE BY MOUTH THREE TIMES WEEKLY AS NEEDED. 90 capsule 0   hyoscyamine (LEVSIN) 0.125 MG tablet Take 1 tablet (0.125 mg total) by mouth every 4 (four) hours as needed. 30 tablet 0   lidocaine (XYLOCAINE) 5 % ointment Apply 1 application topically as needed. 35.44 g 0   Na Sulfate-K Sulfate-Mg Sulf 17.5-3.13-1.6 GM/177ML SOLN 1 colonscopy prep per GI office instructions 354 mL 0   polyethylene glycol (MIRALAX / GLYCOLAX) 17 g packet Take 17 g by mouth daily as needed for moderate constipation. 14 each 5   Vibegron (GEMTESA) 75 MG TABS Take 1 tablet (75 mg total) by mouth daily. 42 tablet 0   warfarin (COUMADIN) 5 MG tablet  Take 1 and 1/2 tablet by mouth everyday. 135 tablet 0   diazepam (DIASTAT) 2.5 MG GEL Place 2.5 mg rectally once for 1 dose. 10 each 1   No current facility-administered medications on file prior to visit.    Allergies  Allergen Reactions   Clindamycin/Lincomycin     "unknown"   Tape Other (See Comments)    When pt has a band-aid on, the impression of band-aid stays on for a month    Social History   Socioeconomic History   Marital status: Married    Spouse name: Not on file   Number of children: 1   Years of education: Not on file   Highest education level: Not on file  Occupational  History   Occupation: student advocate   Tobacco Use   Smoking status: Former    Years: 4.00    Types: Cigarettes    Passive exposure: Past   Smokeless tobacco: Never  Vaping Use   Vaping Use: Never used  Substance and Sexual Activity   Alcohol use: Not Currently    Comment: rarely   Drug use: No   Sexual activity: Not on file  Other Topics Concern   Not on file  Social History Narrative   HH of 3  Daughter 28 and self.  hh of 2    No pets.   No ets.   No etoh.    Working for [employer] and [employer] .  45- 50 hours per week.                Social Determinants of Health   Financial Resource Strain: Not on file  Food Insecurity: Not on file  Transportation Needs: Not on file  Physical Activity: Not on file  Stress: Not on file  Social Connections: Not on file  Intimate Partner Violence: Not on file    Family History  Problem Relation Age of Onset   Hypertension Mother    Rheum arthritis Mother    Lung cancer Father 73       april 12    Pancreatic cancer Father 60   Diabetes Paternal Aunt        x2   Colon cancer Neg Hx    Colon polyps Neg Hx    Kidney disease Neg Hx    Esophageal cancer Neg Hx    Gallbladder disease Neg Hx    Rectal cancer Neg Hx    Stomach cancer Neg Hx    Breast cancer Neg Hx     The following portions of the patient's history were reviewed and updated as appropriate: allergies, current medications, past family history, past medical history, past social history, past surgical history and problem list.  Review of Systems Review of Systems - Negative except as mentioned in HPI  Review of Systems - General ROS: negative for - chills, fatigue, fever, hot flashes, malaise or night sweats Hematological and Lymphatic ROS: negative for - bleeding problems or swollen lymph nodes Gastrointestinal ROS: negative for - abdominal pain, blood in stools, change in bowel habits and nausea/vomiting Musculoskeletal ROS: negative for - joint pain, muscle pain  or muscular weakness Genito-Urinary ROS: negative for - change in menstrual cycle, dysmenorrhea, dyspareunia, dysuria, genital discharge, genital ulcers, hematuria, incontinence, irregular/heavy menses, nocturia or pelvic pain. Positive for vaginal pain   Objective:   BP (!) 140/86   Pulse (!) 57   Resp 16   Ht '5\' 5"'$  (1.651 m)   Wt 210 lb 6.4 oz (95.4 kg)  BMI 35.01 kg/m  CONSTITUTIONAL: Well-developed, well-nourished female in no acute distress.  HENT:  Normocephalic, atraumatic.  NECK: Normal range of motion, supple, no masses.  Normal thyroid.  SKIN: Skin is warm and dry. No rash noted. Not diaphoretic. No erythema. No pallor. [place]: Alert and oriented to person, place, and time. PSYCHIATRIC: Normal mood and affect. Normal behavior. Normal judgment and thought content. CARDIOVASCULAR:Not Examined RESPIRATORY: Not Examined BREASTS: Not Examined ABDOMEN: Soft, non distended; Non tender.  No Organomegaly. PELVIC:  External Genitalia: Normal  BUS: Normal  Vagina: Normal, pain on digital exam anteriorly below the urethra  Cervix: strawberry like spots noted, swap collected  Uterus: Normal size, shape,consistency, mobile  RV: Normal   Bladder: Nontender MUSCULOSKELETAL: Normal range of motion. No tenderness.  No cyanosis, clubbing, or edema.     Assessment:   1. Vaginal pain      Plan:   Dr. Amalia Hailey consulted given pt hx bladder cancer & DVT.  He recommended use of local estrogen every other night x 3 wks followed by 2 x wkly. Estrace ordered. Pt in excruciating pain , requesting medications to help manage. Consulted with Dr. Amalia Hailey, he is in agreement to a course of percocet . Discussed with pt that likely not vagial /gyn related . Discussed for longer term use of pain ( if no improvement with estrogen use) to contact urology for pain management options. Will follow up with swab results. Pt verbalizes and agrees to plan of care.   Philip Aspen, [facility]

## 2022-07-03 NOTE — Telephone Encounter (Signed)
From: [name] To: Office of [place], [place] Sent: 07/03/2022 8:12 AM EST Subject: Medication Renewal Request  Refills have been requested for the following medications:   diazepam (DIASTAT) 2.5 MG GEL [[place]]  Preferred pharmacy: [id number] [place], [place] - [address] Delivery method: PArlyss Gandy

## 2022-07-04 ENCOUNTER — Other Ambulatory Visit: Payer: Self-pay | Admitting: Certified Nurse Midwife

## 2022-07-04 ENCOUNTER — Encounter: Payer: Self-pay | Admitting: Certified Nurse Midwife

## 2022-07-04 LAB — CERVICOVAGINAL ANCILLARY ONLY
Bacterial Vaginitis (gardnerella): POSITIVE — AB
Candida Glabrata: NEGATIVE
Candida Vaginitis: POSITIVE — AB
Chlamydia: NEGATIVE
Comment: NEGATIVE
Comment: NEGATIVE
Comment: NEGATIVE
Comment: NEGATIVE
Comment: NEGATIVE
Comment: NORMAL
Neisseria Gonorrhea: NEGATIVE
Trichomonas: NEGATIVE

## 2022-07-04 MED ORDER — METRONIDAZOLE 500 MG PO TABS: 500.0000 mg | ORAL_TABLET | Freq: Two times a day (BID) | ORAL | 0 refills | Status: AC

## 2022-07-04 MED ORDER — FLUCONAZOLE 150 MG PO TABS: 150.0000 mg | ORAL_TABLET | Freq: Once | ORAL | 0 refills | Status: AC

## 2022-07-07 ENCOUNTER — Other Ambulatory Visit: Payer: Self-pay | Admitting: Urology

## 2022-07-07 ENCOUNTER — Telehealth: Payer: Self-pay

## 2022-07-07 ENCOUNTER — Ambulatory Visit: Payer: BC Managed Care – PPO | Admitting: Urology

## 2022-07-07 ENCOUNTER — Encounter: Payer: Self-pay | Admitting: Urology

## 2022-07-07 VITALS — BP 143/85 | HR 67 | Temp 97.8°F

## 2022-07-07 DIAGNOSIS — N3289 Other specified disorders of bladder: Secondary | ICD-10-CM

## 2022-07-07 MED ORDER — SODIUM BICARBONATE 8.4 % IV SOLN
11.0000 mL | Freq: Once | INTRAVENOUS | Status: AC
  Administered 2022-07-07: 11 mL

## 2022-07-07 MED ORDER — DIAZEPAM 2.5 MG RE GEL: 2.5000 mg | Freq: Once | RECTAL | 1 refills | Status: DC

## 2022-07-07 MED ORDER — CYCLOBENZAPRINE HCL 10 MG PO TABS: 10.0000 mg | ORAL_TABLET | Freq: Three times a day (TID) | ORAL | 0 refills | Status: DC | PRN

## 2022-07-07 MED ORDER — PREDNISONE 10 MG (21) PO TBPK: ORAL_TABLET | ORAL | 0 refills | Status: DC

## 2022-07-07 NOTE — Progress Notes (Signed)
07/07/2022 4:33 PM   Theresa Lewis Franchot Erichsen [birth year] [id number]  Referring provider: Pleas Koch, NP [place],   [id number]  Urological history: 1. T1 high grade urothelial carcinoma bladder TURBT 03/2020 >6 cm papillary bladder tumor Path T1 high-grade urothelial carcinoma Restaging TURBT 04/2020; no residual tumor 6 week induction BCG completed March 2022 Multifocal recurrence cystoscopy 10/31/2020 TURBT 11/2020 Ta high-grade UCa Reinduction BCG completed 02/22/2021 3-week maintenance BCG: 10/2021 Contrast CT (11/2021) - no worrisome findings  Cysto (12/2021) - NED Urine cytology (12/2021) - negative   Chief Complaint  Patient presents with   Cystitis    HPI: Theresa Lewis is a 62 y.o. female who presents today for frequency and bladder pain.    Since her second out of third maintenance BCG instillation, she has been suffering with urinary frequency going to the restroom every 30 minutes and experiencing intense sharp stabbing pains through the urethra upon completion.  Patient denies any modifying or aggravating factors.  Patient denies any gross hematuria or suprapubic/flank pain.  Patient denies any fevers, chills, nausea or vomiting.  The symptoms have only had minimal relief with instillation of lidocaine jelly.  The symptoms have not responded to oxycodone or Gemtesa.  CT renal stone study was negative for any ureteral stones.  She has also been evaluated by gynecology and has taken Diflucan and Flagyl without relief of her symptoms.  She is also applying vaginal estrogen cream.  She is currently on doxycycline without relief.  She also found no relief with Levsin tablets.  She is having some response to Valium rectal suppositories and gabapentin, but these only give her a few hours of relief.  Acid-fast smear panel was negative.  Urine culture grew out mixed urogenital flora.  There is a referral to Dr. Amalia Hailey at [facility]  pending.  PMH: Past Medical History:  Diagnosis Date   ABNORMAL VAGINAL BLEEDING 04/19/2007   Qualifier: Diagnosis of  By: Hulan Saas, CMA (AAMA), Larene Beach S    Acute cystitis without hematuria 01/31/2020   Acute extremity pain 11/04/2019   Acute pelvic pain, female 12/04/2021   Anemia    h/o as a child   Cancer ([place])    Complication of anesthesia    stays asleep for a "very long time"   Deep venous thrombosis ([place])    2006    3 x clotting    GERD (gastroesophageal reflux disease)    HEMORRHOIDS, WITH BLEEDING 12/26/2008   Qualifier: Diagnosis of  By: Regis Bill MD, Standley Brooking    PE (pulmonary embolism) 07/09/2010   Pre-diabetes    Pulmonary embolism ([place])     Surgical History: Past Surgical History:  Procedure Laterality Date   MYOMECTOMY     rectal tear     TRANSURETHRAL RESECTION OF BLADDER TUMOR N/A 04/24/2020   Procedure: TRANSURETHRAL RESECTION OF BLADDER TUMOR (TURBT);  Surgeon: Abbie Sons, MD;  Location: [facility];  Service: Urology;  Laterality: N/A;   TRANSURETHRAL RESECTION OF BLADDER TUMOR N/A 05/15/2020   Procedure: TRANSURETHRAL RESECTION OF BLADDER TUMOR (TURBT);  Surgeon: Abbie Sons, MD;  Location: [facility];  Service: Urology;  Laterality: N/A;   TRANSURETHRAL RESECTION OF BLADDER TUMOR N/A 12/04/2020   Procedure: TRANSURETHRAL RESECTION OF BLADDER TUMOR (TURBT);  Surgeon: Abbie Sons, MD;  Location: [facility] OR[facility];  Service: Urology;  Laterality: N/A;   TUBAL LIGATION     vena caval filter      Home Medications:  Allergies as of  07/07/2022       Reactions   Clindamycin/lincomycin    "unknown"   Tape Other (See Comments)   When pt has a band-aid on, the impression of band-aid stays on for a month        Medication List        Accurate as of July 07, 2022  4:33 PM. If you have any questions, ask your nurse or doctor.          cyclobenzaprine 10 MG tablet Commonly known as: FLEXERIL Take 1 tablet (10 mg total) by mouth 3 (three) times  daily as needed for muscle spasms. Started by: Zara Council, PA-C   diazepam 2.5 MG Gel Commonly known as: DIASTAT Place 2.5 mg rectally once for 1 dose.   doxycycline 100 MG capsule Commonly known as: VIBRAMYCIN Take 1 capsule (100 mg total) by mouth every 12 (twelve) hours.   estradiol 0.1 MG/GM vaginal cream Commonly known as: ESTRACE VAGINAL Place 1 Applicatorful vaginally at bedtime. 1/3 of applicator every other night x 1 month. Then 2 times a week   gabapentin 100 MG capsule Commonly known as: NEURONTIN Take 2 capsules (200 mg total) by mouth 3 (three) times daily. For pain   Gemtesa 75 MG Tabs Generic drug: Vibegron Take 1 tablet (75 mg total) by mouth daily.   hyoscyamine 0.125 MG tablet Commonly known as: Levsin Take 1 tablet (0.125 mg total) by mouth every 4 (four) hours as needed.   lidocaine 5 % ointment Commonly known as: XYLOCAINE Apply 1 application topically as needed.   metroNIDAZOLE 500 MG tablet Commonly known as: FLAGYL Take 1 tablet (500 mg total) by mouth 2 (two) times daily for 7 days.   Na Sulfate-K Sulfate-Mg Sulf 17.5-3.13-1.6 GM/177ML Soln 1 colonscopy prep per GI office instructions   oxyCODONE-acetaminophen 5-325 MG tablet Commonly known as: Percocet Take 1 tablet by mouth every 6 (six) hours as needed for severe pain.   polyethylene glycol 17 g packet Commonly known as: MIRALAX / GLYCOLAX Take 17 g by mouth daily as needed for moderate constipation.   predniSONE 10 MG (21) Tbpk tablet Commonly known as: STERAPRED UNI-PAK 21 TAB Take 6 tablets day 1, take 5 tablets day 2, take 4 tablets day 3, take 3 tablets day 4, take 2 tablets day 5, and take 1 tablet day 6 Started by: Lois Ostrom, PA-C   warfarin 5 MG tablet Commonly known as: COUMADIN Take as directed by the anticoagulation clinic. If you are unsure how to take this medication, talk to your nurse or doctor. Original instructions: Take 1 and 1/2 tablet by mouth everyday.         Allergies:  Allergies  Allergen Reactions   Clindamycin/Lincomycin     "unknown"   Tape Other (See Comments)    When pt has a band-aid on, the impression of band-aid stays on for a month    Family History: Family History  Problem Relation Age of Onset   Hypertension Mother    Rheum arthritis Mother    Lung cancer Father 58       april 12    Pancreatic cancer Father 66   Diabetes Paternal Aunt        x2   Colon cancer Neg Hx    Colon polyps Neg Hx    Kidney disease Neg Hx    Esophageal cancer Neg Hx    Gallbladder disease Neg Hx    Rectal cancer Neg Hx    Stomach cancer Neg  Hx    Breast cancer Neg Hx     Social History:  reports that she has quit smoking. Her smoking use included cigarettes. She has been exposed to tobacco smoke. She has never used smokeless tobacco. She reports that she does not currently use alcohol. She reports that she does not use drugs.  ROS: Pertinent ROS in HPI  Physical Exam: BP (!) 143/85 (BP Location: Left Arm, Patient Position: Sitting, Cuff Size: Large)   Pulse 67   Temp 97.8 F (36.6 C) (Oral)   SpO2 100%   Constitutional:  Well nourished. Alert and oriented, No acute distress. HEENT: [place] AT, moist mucus membranes.  Trachea midline Cardiovascular: No clubbing, cyanosis, or edema. Respiratory: Normal respiratory effort, no increased work of breathing. Neurologic: Grossly intact, no focal deficits, moving all 4 extremities. Psychiatric: Normal mood and affect.    Laboratory Data: N/A    Pertinent Imaging: N/A   Bladder Rescue Solution Instillation Due to bladder pain patient is present today for a Rescue Solution Treatment.  Patient was cleaned and prepped in a sterile fashion with betadine and lidocaine 2% jelly was instilled into the urethra.  A 14 FR catheter was inserted, urine return was noted 5 ml, urine was orange in color.  Instilled a solution consisting of 55m of Sodium Bicarb, 2 ml Lidocaine and 1 ml of Heparin.  The catheter was then removed. Patient tolerated well, no complications were noted.   Performed by: SZara Council PA-C and ODarrick Grinder CMA   Assessment & Plan:    1. Bladder pain  -Recommended treatments for bladder spasms have been exhausted and no etiology has yet to be discovered for symptoms, repeat cystoscopy at this time would be a very low yield as the bladder mucosa will likely be inflamed from the prior BCG treatments -She had an episode that was somewhat soft similar in July and she was prescribed Flexeril by her PCP, so I gave her a short prescription of the this along with prednisone taper to see if we cannot tamper some of the inflammation that may be occurring in her bladder -We will also continue bladder rescue solutions  Return for Wednesday for rescue solution.  These notes generated with voice recognition software. I apologize for typographical errors.  [place] [address] [place] [phone]

## 2022-07-07 NOTE — Telephone Encounter (Signed)
Noted and appreciate the close follow up with this patient.

## 2022-07-07 NOTE — Telephone Encounter (Signed)
Reviewing pt chart since missing last coumadin clinic apt. Pt has seen OB-GYN and has tested positive for yeast and bacterial vaginosis. GYN reported in note she prescribed for these infections. Medication prescribed is metronidazole. This medication can have a major interaction with warfarin. Pt will need INR checked this week. Pt started metronidazole on 2/9   Contacted pt and advised of major interaction with metronidazole and warfarin. Pt reports she is still in pain but has stopped using ibuprofen and has started taking tylenol. Advised pt the need to check INR. Pt reports she had a couple of drops of blood in urine 2 days ago. Denies any other s/s of bleeding or abnormal bruising.  Advised pt to reduce dose of warfarin today to take 1 tablet and reduce dose of warfarin tomorrow to 1 tablet and then resume normal dosing on the following day and apt scheduled for this week to check INR. Advised pt if any s/s of bleeding or abnormal bruising to contact office or coumadin clinic. Advised of ER precautions. Pt verbalized understanding.   Pt scheduled for INR check on 2/15.

## 2022-07-08 ENCOUNTER — Encounter: Payer: Self-pay | Admitting: Urology

## 2022-07-08 ENCOUNTER — Ambulatory Visit: Payer: BC Managed Care – PPO | Admitting: Urology

## 2022-07-08 VITALS — BP 130/80 | HR 74 | Ht 65.0 in | Wt 200.0 lb

## 2022-07-08 DIAGNOSIS — R102 Pelvic and perineal pain: Secondary | ICD-10-CM

## 2022-07-08 MED ORDER — SODIUM BICARBONATE 8.4 % IV SOLN
11.0000 mL | Freq: Once | INTRAVENOUS | Status: AC
  Administered 2022-07-08: 11 mL

## 2022-07-08 NOTE — Progress Notes (Signed)
Bladder Rescue Solution Instillation  Due to bladder spasms and pain patient is present today for a Rescue Solution Treatment.  Patient was cleaned and prepped in a sterile fashion with betadine and lidocaine 2% jelly was instilled into the urethra.  A 14 FR catheter was inserted, urine return was noted 0 ml, urine was yellow in color.  Instilled a solution consisting of 35m of Sodium Bicarb, 2 ml Lidocaine and 1 ml of Heparin. The catheter was then removed. Patient tolerated well, no complications were noted.   Performed by: SZara Council PA-C and JGaspar Cola CMA  Follow up/ Additional Notes: tomorrow for rescue solution

## 2022-07-09 ENCOUNTER — Observation Stay
Admission: EM | Admit: 2022-07-09 | Discharge: 2022-07-10 | Disposition: A | Discharge status: Home / Self Care | Payer: BC Managed Care – PPO | Attending: Family Medicine | Admitting: Family Medicine

## 2022-07-09 ENCOUNTER — Encounter: Payer: Self-pay | Admitting: Emergency Medicine

## 2022-07-09 ENCOUNTER — Ambulatory Visit: Payer: BC Managed Care – PPO | Admitting: Urology

## 2022-07-09 ENCOUNTER — Telehealth: Payer: Self-pay

## 2022-07-09 ENCOUNTER — Emergency Department: Payer: BC Managed Care – PPO

## 2022-07-09 ENCOUNTER — Encounter: Payer: Self-pay | Admitting: Urology

## 2022-07-09 VITALS — Ht 65.0 in | Wt 200.0 lb

## 2022-07-09 DIAGNOSIS — D6861 Antiphospholipid syndrome: Secondary | ICD-10-CM | POA: Diagnosis present

## 2022-07-09 DIAGNOSIS — Z87891 Personal history of nicotine dependence: Secondary | ICD-10-CM | POA: Insufficient documentation

## 2022-07-09 DIAGNOSIS — Z79899 Other long term (current) drug therapy: Secondary | ICD-10-CM | POA: Insufficient documentation

## 2022-07-09 DIAGNOSIS — Z7901 Long term (current) use of anticoagulants: Secondary | ICD-10-CM | POA: Diagnosis not present

## 2022-07-09 DIAGNOSIS — N3289 Other specified disorders of bladder: Secondary | ICD-10-CM

## 2022-07-09 DIAGNOSIS — R3989 Other symptoms and signs involving the genitourinary system: Principal | ICD-10-CM | POA: Insufficient documentation

## 2022-07-09 DIAGNOSIS — R102 Pelvic and perineal pain: Secondary | ICD-10-CM

## 2022-07-09 DIAGNOSIS — Z86718 Personal history of other venous thrombosis and embolism: Secondary | ICD-10-CM | POA: Insufficient documentation

## 2022-07-09 DIAGNOSIS — C679 Malignant neoplasm of bladder, unspecified: Secondary | ICD-10-CM | POA: Insufficient documentation

## 2022-07-09 DIAGNOSIS — R52 Pain, unspecified: Secondary | ICD-10-CM

## 2022-07-09 DIAGNOSIS — Z86711 Personal history of pulmonary embolism: Secondary | ICD-10-CM | POA: Insufficient documentation

## 2022-07-09 DIAGNOSIS — N390 Urinary tract infection, site not specified: Secondary | ICD-10-CM

## 2022-07-09 LAB — URINALYSIS, ROUTINE W REFLEX MICROSCOPIC
Bilirubin Urine: NEGATIVE
Glucose, UA: NEGATIVE mg/dL
Ketones, ur: NEGATIVE mg/dL
Nitrite: NEGATIVE
Protein, ur: >=300 mg/dL — AB
RBC / HPF: >50 RBC/hpf (ref 0–5)
Specific Gravity, Urine: 1.034 — ABNORMAL HIGH (ref 1.005–1.030)
WBC, UA: >50 WBC/hpf (ref 0–5)
pH: 5 (ref 5.0–8.0)

## 2022-07-09 LAB — CBC WITH DIFFERENTIAL/PLATELET
Abs Immature Granulocytes: 0.03 10*3/uL (ref 0.00–0.07)
Basophils Absolute: 0 10*3/uL (ref 0.0–0.1)
Basophils Relative: 0 %
Eosinophils Absolute: 0 10*3/uL (ref 0.0–0.5)
Eosinophils Relative: 0 %
HCT: 34.3 % — ABNORMAL LOW (ref 36.0–46.0)
Hemoglobin: 10.4 g/dL — ABNORMAL LOW (ref 12.0–15.0)
Immature Granulocytes: 0 %
Lymphocytes Relative: 16 %
Lymphs Abs: 1.3 10*3/uL (ref 0.7–4.0)
MCH: 23.3 pg — ABNORMAL LOW (ref 26.0–34.0)
MCHC: 30.3 g/dL (ref 30.0–36.0)
MCV: 76.7 fL — ABNORMAL LOW (ref 80.0–100.0)
Monocytes Absolute: 0.6 10*3/uL (ref 0.1–1.0)
Monocytes Relative: 7 %
Neutro Abs: 6.2 10*3/uL (ref 1.7–7.7)
Neutrophils Relative %: 77 %
Platelets: 477 10*3/uL — ABNORMAL HIGH (ref 150–400)
RBC: 4.47 MIL/uL (ref 3.87–5.11)
RDW: 19.4 % — ABNORMAL HIGH (ref 11.5–15.5)
WBC: 8.2 10*3/uL (ref 4.0–10.5)
nRBC: 0 % (ref 0.0–0.2)

## 2022-07-09 LAB — COMPREHENSIVE METABOLIC PANEL
ALT: 37 U/L (ref 0–44)
AST: 56 U/L — ABNORMAL HIGH (ref 15–41)
Albumin: 4 g/dL (ref 3.5–5.0)
Alkaline Phosphatase: 67 U/L (ref 38–126)
Anion gap: 8 (ref 5–15)
BUN: 20 mg/dL (ref 8–23)
CO2: 24 mmol/L (ref 22–32)
Calcium: 9.6 mg/dL (ref 8.9–10.3)
Chloride: 108 mmol/L (ref 98–111)
Creatinine, Ser: 1.11 mg/dL — ABNORMAL HIGH (ref 0.44–1.00)
GFR, Estimated: 56 mL/min — ABNORMAL LOW (ref >60)
Glucose, Bld: 114 mg/dL — ABNORMAL HIGH (ref 70–99)
Potassium: 4.5 mmol/L (ref 3.5–5.1)
Sodium: 140 mmol/L (ref 135–145)
Total Bilirubin: 1.3 mg/dL — ABNORMAL HIGH (ref 0.3–1.2)
Total Protein: 8 g/dL (ref 6.5–8.1)

## 2022-07-09 LAB — PROTIME-INR
INR: 2.4 — ABNORMAL HIGH (ref 0.8–1.2)
Prothrombin Time: 26 seconds — ABNORMAL HIGH (ref 11.4–15.2)

## 2022-07-09 MED ORDER — SODIUM CHLORIDE 0.9 % IV SOLN
1.0000 g | Freq: Once | INTRAVENOUS | Status: AC
  Administered 2022-07-09: 1 g via INTRAVENOUS
  Filled 2022-07-09: qty 10

## 2022-07-09 MED ORDER — WARFARIN - PHARMACIST DOSING INPATIENT: Freq: Every day | Status: DC

## 2022-07-09 MED ORDER — MIRABEGRON ER 25 MG PO TB24
25.0000 mg | ORAL_TABLET | Freq: Every day | ORAL | Status: DC
  Administered 2022-07-09 – 2022-07-10 (×2): 25 mg via ORAL
  Filled 2022-07-09 (×4): qty 1

## 2022-07-09 MED ORDER — WARFARIN SODIUM 7.5 MG PO TABS
7.5000 mg | ORAL_TABLET | Freq: Once | ORAL | Status: AC
  Administered 2022-07-09: 7.5 mg via ORAL
  Filled 2022-07-09: qty 1

## 2022-07-09 MED ORDER — MORPHINE SULFATE (PF) 4 MG/ML IV SOLN
4.0000 mg | Freq: Once | INTRAVENOUS | Status: AC
  Administered 2022-07-09: 4 mg via INTRAVENOUS
  Filled 2022-07-09: qty 1

## 2022-07-09 MED ORDER — GABAPENTIN 100 MG PO CAPS
200.0000 mg | ORAL_CAPSULE | Freq: Three times a day (TID) | ORAL | Status: DC
  Administered 2022-07-09 – 2022-07-10 (×2): 200 mg via ORAL
  Filled 2022-07-09 (×2): qty 2

## 2022-07-09 MED ORDER — ONDANSETRON HCL 4 MG/2ML IJ SOLN
4.0000 mg | Freq: Four times a day (QID) | INTRAMUSCULAR | Status: DC | PRN
  Administered 2022-07-09: 4 mg via INTRAVENOUS
  Filled 2022-07-09: qty 2

## 2022-07-09 MED ORDER — OXYBUTYNIN CHLORIDE ER 15 MG PO TB24: 15.0000 mg | ORAL_TABLET | Freq: Every day | ORAL | 0 refills | Status: DC

## 2022-07-09 MED ORDER — PHENAZOPYRIDINE HCL 100 MG PO TABS
100.0000 mg | ORAL_TABLET | Freq: Once | ORAL | Status: AC
  Administered 2022-07-09: 100 mg via ORAL
  Filled 2022-07-09: qty 1

## 2022-07-09 MED ORDER — ACETAMINOPHEN 325 MG PO TABS: 650.0000 mg | ORAL_TABLET | Freq: Four times a day (QID) | ORAL | Status: DC | PRN

## 2022-07-09 MED ORDER — KETOROLAC TROMETHAMINE 60 MG/2ML IM SOLN
60.0000 mg | Freq: Once | INTRAMUSCULAR | Status: DC
  Administered 2022-07-09: 60 mg via INTRAMUSCULAR

## 2022-07-09 MED ORDER — METRONIDAZOLE 500 MG PO TABS
500.0000 mg | ORAL_TABLET | Freq: Two times a day (BID) | ORAL | Status: DC
  Administered 2022-07-09 – 2022-07-10 (×2): 500 mg via ORAL
  Filled 2022-07-09 (×2): qty 1

## 2022-07-09 MED ORDER — ADULT MULTIVITAMIN W/MINERALS CH
1.0000 | ORAL_TABLET | Freq: Every day | ORAL | Status: DC
  Administered 2022-07-10: 1 via ORAL
  Filled 2022-07-09: qty 1

## 2022-07-09 MED ORDER — OXYCODONE-ACETAMINOPHEN 7.5-325 MG PO TABS
1.0000 | ORAL_TABLET | Freq: Four times a day (QID) | ORAL | Status: DC | PRN
  Administered 2022-07-09 – 2022-07-10 (×2): 1 via ORAL
  Filled 2022-07-09 (×2): qty 1

## 2022-07-09 MED ORDER — ACETAMINOPHEN 500 MG PO TABS
1000.0000 mg | ORAL_TABLET | Freq: Three times a day (TID) | ORAL | Status: DC
  Administered 2022-07-09 – 2022-07-10 (×3): 1000 mg via ORAL
  Filled 2022-07-09 (×3): qty 2

## 2022-07-09 MED ORDER — MORPHINE SULFATE (PF) 4 MG/ML IV SOLN
4.0000 mg | INTRAVENOUS | Status: DC | PRN
  Administered 2022-07-09 – 2022-07-10 (×3): 4 mg via INTRAVENOUS
  Filled 2022-07-09 (×3): qty 1

## 2022-07-09 MED ORDER — ENOXAPARIN SODIUM 40 MG/0.4ML IJ SOSY: 40.0000 mg | PREFILLED_SYRINGE | INTRAMUSCULAR | Status: DC

## 2022-07-09 MED ORDER — ACETAMINOPHEN 325 MG RE SUPP: 650.0000 mg | Freq: Four times a day (QID) | RECTAL | Status: DC | PRN

## 2022-07-09 MED ORDER — IOHEXOL 300 MG/ML  SOLN
100.0000 mL | Freq: Once | INTRAMUSCULAR | Status: AC | PRN
  Administered 2022-07-09: 100 mL via INTRAVENOUS

## 2022-07-09 NOTE — Telephone Encounter (Signed)
[place] Night - Client Nonclinical Telephone Record  [facility] Client [facility] Night - Client Client Site [place] - Night Provider Alma Friendly - NP Contact Type Call Who Is Calling Patient / Member / Family / Caregiver Caller Name [place] Phone Number [phone] Patient Name Marvelle Morrell Patient DOB [birth year] Call Type Message Only Information Provided Reason for Call Request to [facility] Appointment Initial Comment Caller states she needs cancel an appt. Patient request to speak to RN No Disp. Time Disposition Final User 07/09/2022 1:10:46 PM General Information Provided Yes Delman Cheadle Call Closed By: Delman Cheadle Transaction Date/Time: 07/09/2022 1:08:40 PM (ET   Per chart review tab pt is presenlty at [facility] ED/. Amy out of office sending note to [facility].

## 2022-07-09 NOTE — ED Notes (Signed)
Pt returned from CT °

## 2022-07-09 NOTE — Telephone Encounter (Signed)
Patient appointment was cancelled already.

## 2022-07-09 NOTE — ED Triage Notes (Signed)
Pt endorses bladder pain for 3-4 weeks. Pt has hx of bladder cancer. Pt states they have not been able to get her pain under control. Pt urinating frequently and painful with urination.

## 2022-07-09 NOTE — Consult Note (Addendum)
ANTICOAGULATION CONSULT NOTE - Initial Consult  Pharmacy Consult for warfarin dosing Indication: Antiphospholipid syndrome  Allergies  Allergen Reactions   Clindamycin/Lincomycin     "unknown"   Tape Other (See Comments)    When pt has a band-aid on, the impression of band-aid stays on for a month    Patient Measurements: Height: 5' 5"$  (165.1 cm) Weight: 95 kg (209 lb 7 oz) IBW/kg (Calculated) : 57  Vital Signs: Temp: 98.4 F (36.9 C) (02/14 1529) Temp Source: Oral (02/14 1529) BP: 138/88 (02/14 1528) Pulse Rate: 65 (02/14 1528)  Labs: Recent Labs    07/09/22 1055  HGB 10.4*  HCT 34.3*  PLT 477*  CREATININE 1.11*    Estimated Creatinine Clearance: 59.9 mL/min (A) (by C-G formula based on SCr of 1.11 mg/dL (H)).   Medical History: Past Medical History:  Diagnosis Date   ABNORMAL VAGINAL BLEEDING 04/19/2007   Qualifier: Diagnosis of  By: Hulan Saas, CMA (AAMA), Larene Beach S    Acute cystitis without hematuria 01/31/2020   Acute extremity pain 11/04/2019   Acute pelvic pain, female 12/04/2021   Anemia    h/o as a child   Cancer ([place])    Complication of anesthesia    stays asleep for a "very long time"   Deep venous thrombosis ([place])    2006    3 x clotting    GERD (gastroesophageal reflux disease)    HEMORRHOIDS, WITH BLEEDING 12/26/2008   Qualifier: Diagnosis of  By: Regis Bill MD, Standley Brooking    PE (pulmonary embolism) 07/09/2010   Pre-diabetes    Pulmonary embolism (HCC)     Medications:  Patient's home warfarin dose 7.5 mg po daily.  Reports last dose taken 07/08/22.  Assessment: 62 yo female presented to the ED with complaint of bladder pain.  Patient takes warfarin at home for antiphospholipid syndrome.  Pharmacy has been consulted to dose warfarin while hospitalized.  Goal of Therapy:  INR 2.5-3.5 per patient Monitor platelets by anticoagulation protocol: Yes   Date INR Warfarin Dose  2/14 2.4 7.5 mg         Plan:  --Give Warfarin 7.5 mg po x 1 (home  dose) --Check daily PT/INR  Lorin Picket, PharmD 07/09/2022,3:48 PM

## 2022-07-09 NOTE — Assessment & Plan Note (Signed)
Patient is currently undergoing maintenance BCG infusions.  Last infusion was on 06/13/2022.

## 2022-07-09 NOTE — Telephone Encounter (Signed)
Noted. Will review Urology and ED notes.

## 2022-07-09 NOTE — Telephone Encounter (Signed)
Pt LVM reporting she has coumadin clinic apt tomorrow and she will have to RS because she is in the ER and they are going to keep her for observation.

## 2022-07-09 NOTE — ED Provider Notes (Signed)
[facility] Provider Note   Event Date/Time   First MD Initiated Contact with Patient 07/09/22 1018     (approximate) History  Pain  HPI Theresa Lewis is a 62 y.o. female with a stated past medical history of bladder cancer status post treatment and and currently in remission who presents for suprapubic and urethral pain that is 10/10 and present over the last 4 weeks.  Patient presents from her urology clinic where she was seen due to persistent pain.  Patient states that she got a "bladder rescue solution" twice and the second instillation did not help her pain whatsoever.  Patient also states that she has been using gabapentin, oxycodone, Tylenol, Gemtesa, steroid taper, as well as a course of Diflucan and Flagyl from gynecology.  Patient denies any of these interventions helping her pain and upon my arrival for this interview she is actively crying. ROS: Patient currently denies any vision changes, tinnitus, difficulty speaking, facial droop, sore throat, chest pain, shortness of breath, nausea/vomiting/diarrhea, or weakness/numbness/paresthesias in any extremity   Physical Exam  Triage Vital Signs: ED Triage Vitals  Enc Vitals Group     BP 07/09/22 0941 (!) 156/95     Pulse Rate 07/09/22 0941 (!) 58     Resp 07/09/22 0941 20     Temp 07/09/22 0941 98.2 F (36.8 C)     Temp Source 07/09/22 0941 Oral     SpO2 07/09/22 0941 94 %     Weight 07/09/22 0941 210 lb (95.3 kg)     Height 07/09/22 0941 5' 5"$  (1.651 m)     Head Circumference --      Peak Flow --      Pain Score 07/09/22 0945 10     Pain Loc --      Pain Edu? --      Excl. in [place]? --    Most recent vital signs: Vitals:   07/09/22 1528 07/09/22 1529  BP: 138/88   Pulse: 65   Resp: 18   Temp:  98.4 F (36.9 C)  SpO2: 96%    General: Awake, oriented x4. CV:  Good peripheral perfusion.  Resp:  Normal effort.  Abd:  No distention.  Other:   ED Results / Procedures / Treatments   Labs (all labs ordered are listed, but only abnormal results are displayed) Labs Reviewed  URINALYSIS, ROUTINE W REFLEX MICROSCOPIC - Abnormal; Notable for the following components:      Result Value   Color, Urine AMBER (*)    APPearance CLOUDY (*)    Specific Gravity, Urine 1.034 (*)    Hgb urine dipstick LARGE (*)    Protein, ur >=300 (*)    Leukocytes,Ua MODERATE (*)    Bacteria, UA FEW (*)    All other components within normal limits  COMPREHENSIVE METABOLIC PANEL - Abnormal; Notable for the following components:   Glucose, Bld 114 (*)    Creatinine, Ser 1.11 (*)    AST 56 (*)    Total Bilirubin 1.3 (*)    GFR, Estimated 56 (*)    All other components within normal limits  CBC WITH DIFFERENTIAL/PLATELET - Abnormal; Notable for the following components:   Hemoglobin 10.4 (*)    HCT 34.3 (*)    MCV 76.7 (*)    MCH 23.3 (*)    RDW 19.4 (*)    Platelets 477 (*)    All other components within normal limits  PROTIME-INR    RADIOLOGY ED MD  interpretation: CT of the abdomen and pelvis with IV contrast shows no evidence of bladder cancer recurrence or metastasis and does show small iliac lymph nodes which are unchanged -Agree with radiology assessment Official radiology report(s): CT Abdomen Pelvis W Contrast  Result Date: 07/09/2022 CLINICAL DATA:  Bladder cancer. Pelvic pain. Bladder spasms. * Tracking Code: BO * EXAM: CT ABDOMEN AND PELVIS WITH CONTRAST TECHNIQUE: Multidetector CT imaging of the abdomen and pelvis was performed using the standard protocol following bolus administration of intravenous contrast. RADIATION DOSE REDUCTION: This exam was performed according to the departmental dose-optimization program which includes automated exposure control, adjustment of the mA and/or kV according to patient size and/or use of iterative reconstruction technique. CONTRAST:  124m OMNIPAQUE IOHEXOL 300 MG/ML  SOLN COMPARISON:  None Available. FINDINGS: Lower chest: Lung bases are  clear. Hepatobiliary: No focal hepatic lesion. Normal gallbladder. No biliary duct dilatation. Common bile duct is normal. Pancreas: Pancreas is normal. No ductal dilatation. No pancreatic inflammation. Spleen: Normal spleen Adrenals/urinary tract: Adrenal glands normal. No hydronephrosis. No ureterolithiasis or obstructive uropathy. The bladder is predominantly collapsed. There is thin mucosal enhancement is within the bladder. There is mild haziness along the adventitial surface of the bladder (sagittal image 53/series 7) no evidence of bladder mass. Stomach/Bowel: Moderate size hiatal hernia. Stomach, duodenum and small-bowel normal. Appendix is normal. The colon and rectosigmoid colon are normal. Vascular/Lymphatic: Abdominal aorta normal caliber. Infrarenal IVC filter noted. No retroperitoneal lymphadenopathy. No pelvic lymphadenopathy. Small iliac lymph nodes measuring 5-6 mm on LEFT and RIGHT unchanged from prior. Reproductive: Uterus and ovaries grossly normal by CT. Other: No free fluid. Musculoskeletal: No aggressive osseous lesion. IMPRESSION: 1. No evidence of bladder cancer recurrence or metastasis. Small iliac lymph nodes are unchanged. 2. Mucosal enhancement of the bladder and mild haziness along the adventitial surface of the bladder. Findings suggest cystitis. No mass identified. 3. No enhancing renal lesion.  No obstructive uropathy. 4. Moderate size hiatal hernia. Electronically Signed   By: SSuzy BouchardM.D.   On: 07/09/2022 12:43   PROCEDURES: Critical Care performed: No .1-3 Lead EKG Interpretation  Performed by: BNaaman Plummer MD Authorized by: BNaaman Plummer MD     Interpretation: normal     ECG rate:  71   ECG rate assessment: normal     Rhythm: sinus rhythm     Ectopy: none     Conduction: normal    MEDICATIONS ORDERED IN ED: Medications  ondansetron (ZOFRAN) injection 4 mg (4 mg Intravenous Given 07/09/22 1058)  enoxaparin (LOVENOX) injection 40 mg (40 mg  Subcutaneous Not Given 07/09/22 1529)  multivitamin with minerals tablet 1 tablet (1 tablet Oral Not Given 07/09/22 1530)  morphine (PF) 4 MG/ML injection 4 mg (has no administration in time range)  oxyCODONE-acetaminophen (PERCOCET) 7.5-325 MG per tablet 1 tablet (has no administration in time range)  acetaminophen (TYLENOL) tablet 1,000 mg (has no administration in time range)  morphine (PF) 4 MG/ML injection 4 mg (4 mg Intravenous Given 07/09/22 1058)  iohexol (OMNIPAQUE) 300 MG/ML solution 100 mL (100 mLs Intravenous Contrast Given 07/09/22 1143)  cefTRIAXone (ROCEPHIN) 1 g in sodium chloride 0.9 % 100 mL IVPB (0 g Intravenous Stopped 07/09/22 1321)  phenazopyridine (PYRIDIUM) tablet 100 mg (100 mg Oral Given 07/09/22 1301)   IMPRESSION / MDM / ASSESSMENT AND PLAN / ED COURSE  I reviewed the triage vital signs and the nursing notes.  The patient is on the cardiac monitor to evaluate for evidence of arrhythmia and/or significant heart rate changes. Patient's presentation is most consistent with acute presentation with potential threat to life or bodily function. Patient is a 62 year old female with the above-stated past medical history who presents complaining of persistent pelvic pain Not Pregnant. Unlikely TOA, Ovarian Torsion, PID, gonorrhea/chlamydia. Low suspicion for Infected Urolithiasis, AAA, Cholecystitis, Pancreatitis, SBO, Appendicitis, or other acute abdomen. Empiric antibiotics of Rocephin 1 g Consults: I spoke to Dr. Charleen Kirks and internal medicine who graciously agreed to accept this patient onto her service for continued pain control as patient has needed IV narcotic medicine for pain control in our emergency department.  Dispo: Admit to medicine    FINAL CLINICAL IMPRESSION(S) / ED DIAGNOSES   Final diagnoses:  Urinary tract infection with hematuria, site unspecified  Pelvic pain  Intractable pain   Rx / DC Orders   ED Discharge Orders      None      Note:  This document was prepared using Dragon voice recognition software and may include unintentional dictation errors.   Naaman Plummer, MD 07/09/22 [phone]

## 2022-07-09 NOTE — Progress Notes (Signed)
07/09/2022 9:32 AM   Theresa Lewis Jun 25, 1960 [id number]  Referring provider: Pleas Koch, NP [place],   [id number]  Urological history: 1. T1 high grade urothelial carcinoma bladder TURBT 03/2020 >6 cm papillary bladder tumor Path T1 high-grade urothelial carcinoma Restaging TURBT 04/2020; no residual tumor 6 week induction BCG completed March 2022 Multifocal recurrence cystoscopy 10/31/2020 TURBT 11/2020 Ta high-grade UCa Reinduction BCG completed 02/22/2021 3-week maintenance BCG: 10/2021 Contrast CT (11/2021) - no worrisome findings  Cysto (12/2021) - NED Urine cytology (12/2021) - negative   Chief Complaint  Patient presents with   bladder instillation    HPI: Theresa Lewis is a 62 y.o. female who presents today for frequency and bladder pain.    Since her second out of third maintenance BCG instillation, she has been suffering with urinary frequency going to the restroom every 30 minutes and experiencing intense sharp stabbing pains through the urethra upon completion.  Patient denies any modifying or aggravating factors.  Patient denies any gross hematuria or suprapubic/flank pain.  Patient denies any fevers, chills, nausea or vomiting.  The symptoms have only had minimal relief with instillation of lidocaine jelly.  The symptoms have not responded to oxycodone or Gemtesa.  CT renal stone study was negative for any ureteral stones.  She has also been evaluated by gynecology and has taken Diflucan and Flagyl without relief of her symptoms.  She is also applying vaginal estrogen cream.  She is currently on doxycycline without relief.  She also found no relief with Levsin tablets.  She is having some response to Valium rectal suppositories and gabapentin, but these only give her a few hours of relief.  Acid-fast smear panel prelim is negative.  Urine culture grew out mixed urogenital flora.  There is a referral to Dr. Amalia Hailey at [facility]  pending, but he cannot see her until July.    I had started her on a prednisone taper yesterday.  We did a bladder rescue solution on Monday which seemed to provide her some relief, so we performed another bladder rescue solution yesterday and she received no relief from her pain whatsoever.  She continues to experience urinary frequency every 20 minutes with dysuria and a feeling that a knife is cutting her upon completion of urination.  This is occurring day and night and she is exhausted.  She has made comments to me that she cannot go on living like this and feels the need to do something drastic.  PMH: Past Medical History:  Diagnosis Date   ABNORMAL VAGINAL BLEEDING 04/19/2007   Qualifier: Diagnosis of  By: Hulan Saas, CMA (AAMA), Larene Beach S    Acute cystitis without hematuria 01/31/2020   Acute extremity pain 11/04/2019   Acute pelvic pain, female 12/04/2021   Anemia    h/o as a child   Cancer ([place])    Complication of anesthesia    stays asleep for a "very long time"   Deep venous thrombosis ([place])    2006    3 x clotting    GERD (gastroesophageal reflux disease)    HEMORRHOIDS, WITH BLEEDING 12/26/2008   Qualifier: Diagnosis of  By: Regis Bill MD, Standley Brooking    PE (pulmonary embolism) 07/09/2010   Pre-diabetes    Pulmonary embolism ([place])     Surgical History: Past Surgical History:  Procedure Laterality Date   MYOMECTOMY     rectal tear     TRANSURETHRAL RESECTION OF BLADDER TUMOR N/A 04/24/2020  Procedure: TRANSURETHRAL RESECTION OF BLADDER TUMOR (TURBT);  Surgeon: Abbie Sons, MD;  Location: [facility] ORS;  Service: Urology;  Laterality: N/A;   TRANSURETHRAL RESECTION OF BLADDER TUMOR N/A 05/15/2020   Procedure: TRANSURETHRAL RESECTION OF BLADDER TUMOR (TURBT);  Surgeon: Abbie Sons, MD;  Location: [facility] ORS;  Service: Urology;  Laterality: N/A;   TRANSURETHRAL RESECTION OF BLADDER TUMOR N/A 12/04/2020   Procedure: TRANSURETHRAL RESECTION OF BLADDER TUMOR (TURBT);   Surgeon: Abbie Sons, MD;  Location: [facility] ORS;  Service: Urology;  Laterality: N/A;   TUBAL LIGATION     vena caval filter      Home Medications:  Allergies as of 07/09/2022       Reactions   Clindamycin/lincomycin    "unknown"   Tape Other (See Comments)   When pt has a band-aid on, the impression of band-aid stays on for a month        Medication List        Accurate as of July 09, 2022  9:32 AM. If you have any questions, ask your nurse or doctor.          cyclobenzaprine 10 MG tablet Commonly known as: FLEXERIL Take 1 tablet (10 mg total) by mouth 3 (three) times daily as needed for muscle spasms.   diazepam 2.5 MG Gel Commonly known as: DIASTAT Place 2.5 mg rectally once for 1 dose.   doxycycline 100 MG capsule Commonly known as: VIBRAMYCIN Take 1 capsule (100 mg total) by mouth every 12 (twelve) hours.   estradiol 0.1 MG/GM vaginal cream Commonly known as: ESTRACE VAGINAL Place 1 Applicatorful vaginally at bedtime. 1/3 of applicator every other night x 1 month. Then 2 times a week   gabapentin 100 MG capsule Commonly known as: NEURONTIN Take 2 capsules (200 mg total) by mouth 3 (three) times daily. For pain   Gemtesa 75 MG Tabs Generic drug: Vibegron Take 1 tablet (75 mg total) by mouth daily.   hyoscyamine 0.125 MG tablet Commonly known as: Levsin Take 1 tablet (0.125 mg total) by mouth every 4 (four) hours as needed.   lidocaine 5 % ointment Commonly known as: XYLOCAINE Apply 1 application topically as needed.   metroNIDAZOLE 500 MG tablet Commonly known as: FLAGYL Take 1 tablet (500 mg total) by mouth 2 (two) times daily for 7 days.   Na Sulfate-K Sulfate-Mg Sulf 17.5-3.13-1.6 GM/177ML Soln 1 colonscopy prep per GI office instructions   oxybutynin 15 MG 24 hr tablet Commonly known as: DITROPAN XL Take 1 tablet (15 mg total) by mouth daily. Started by: Zara Council, PA-C   oxyCODONE-acetaminophen 5-325 MG tablet Commonly known  as: Percocet Take 1 tablet by mouth every 6 (six) hours as needed for severe pain.   polyethylene glycol 17 g packet Commonly known as: MIRALAX / GLYCOLAX Take 17 g by mouth daily as needed for moderate constipation.   predniSONE 10 MG (21) Tbpk tablet Commonly known as: STERAPRED UNI-PAK 21 TAB Take 6 tablets day 1, take 5 tablets day 2, take 4 tablets day 3, take 3 tablets day 4, take 2 tablets day 5, and take 1 tablet day 6   warfarin 5 MG tablet Commonly known as: COUMADIN Take as directed by the anticoagulation clinic. If you are unsure how to take this medication, talk to your nurse or doctor. Original instructions: Take 1 and 1/2 tablet by mouth everyday.        Allergies:  Allergies  Allergen Reactions   Clindamycin/Lincomycin     "  unknown"   Tape Other (See Comments)    When pt has a band-aid on, the impression of band-aid stays on for a month    Family History: Family History  Problem Relation Age of Onset   Hypertension Mother    Rheum arthritis Mother    Lung cancer Father 71       april 12    Pancreatic cancer Father 58   Diabetes Paternal Aunt        x2   Colon cancer Neg Hx    Colon polyps Neg Hx    Kidney disease Neg Hx    Esophageal cancer Neg Hx    Gallbladder disease Neg Hx    Rectal cancer Neg Hx    Stomach cancer Neg Hx    Breast cancer Neg Hx     Social History:  reports that she has quit smoking. Her smoking use included cigarettes. She has been exposed to tobacco smoke. She has never used smokeless tobacco. She reports that she does not currently use alcohol. She reports that she does not use drugs.  ROS: Pertinent ROS in HPI  Physical Exam: Ht 5' 5"$  (1.651 m)   Wt 200 lb (90.7 kg)   BMI 33.28 kg/m   Constitutional:  Well nourished. Alert and oriented, No acute distress. HEENT: [place] AT, moist mucus membranes.  Trachea midline Cardiovascular: No clubbing, cyanosis, or edema. Respiratory: Normal respiratory effort, no increased work of  breathing. Neurologic: Grossly intact, no focal deficits, moving all 4 extremities. Psychiatric: Normal mood and affect.    Laboratory Data: N/A    Pertinent Imaging: N/A   Assessment & Plan:    1. Bladder pain  -Recommended treatments for bladder spasms have been exhausted and no etiology has yet to be discovered for symptoms, repeat cystoscopy at this time would be a very low yield as the bladder mucosa will likely be inflamed from the prior BCG treatments -Toradol 60 mg IM given here in the office -We will also transfer her care to the emergency department so that she could receive IV pain medications and further workup and also I have some concerns that she may do something drastic at home (suicidal ideations) if the pain continues  Return for transfer to the ED .  These notes generated with voice recognition software. I apologize for typographical errors.  Royden Purl  [place] [address]  [place], [place] [address] [phone]   I spent 30 minutes on the day of the encounter to include pre-visit record review, face-to-face time with the patient, and post-visit ordering of tests.

## 2022-07-09 NOTE — ED Notes (Signed)
Patient transported to CT 

## 2022-07-09 NOTE — Assessment & Plan Note (Signed)
Patient is ending with 1 month history of severe bladder pain both during urination and at rest that began during a BCG infusion.  Multiple attempts have been made for alleviation of pain including multiple rounds of antibiotics despite urine culture demonstrating normal urothelial flora, Flexeril, gabapentin, prednisone, and low-dose oxycodone.  Her pain remains uncontrolled at this time.  I discussed with Theresa Lewis that we can uptitrate her oxycodone to provide her relief, but her primary care physician or urology will need to manage this prescription until she can follow-up with either the pain clinic or another urological specialist.  She expressed understanding.  - Start Percocet 7.5 mg every 6 hours as needed.  Titrate for pain relief - Morphine 4 mg every 4 hours as needed for breakthrough pain - Tylenol 1000 mg every 8 hours scheduled - Follow-up urine culture obtained today.  Hold off on any further antibiotics given prior negative cultures

## 2022-07-09 NOTE — ED Notes (Addendum)
Dr. Charleen Kirks at bedside

## 2022-07-09 NOTE — Assessment & Plan Note (Signed)
-   Warfarin per pharmacy dosing

## 2022-07-09 NOTE — H&P (Signed)
History and Physical    Patient: Theresa Lewis [id number] DOB: 1960-10-26 DOA: 07/09/2022 DOS: the patient was seen and examined on 07/09/2022 PCP: Pleas Koch, NP  Patient coming from: Home  Chief Complaint:  Chief Complaint  Patient presents with   Pain   HPI: Theresa Lewis is a 62 y.o. 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  Theresa Lewis states that approximately 1 month ago, she was receiving her usual maintenance BCG infusion, when midway through, she had sudden onset severe urethral and bladder pain.  Since then, she has persistent bladder pain throughout the day that is significantly exacerbated when urinating.  She states that when she urinates, she has dysuria but then feels a " hot searing pain" shoot into her bladder.  The pain has persisted for the last 4 weeks without any improvement.  She endorses increased urinary frequency and urgency.  She denies any other symptoms including fever, chills, nausea, vomiting, abdominal pain, diarrhea.  She has been eating and drinking less often due to this pain as she has decreased appetite.  Per chart review, patient has followed up with urology and OB/GYN in regards to this pain. She has been trialed on multiple rounds of antibiotics due to concern for UTI, lidocaine instillation, gabapentin, prednisone, Flexeril, and oxycodone.  She was last given 10 tablets of oxycodone on 07/03/2022 and told to take 1 every 12 hours for total course of 5 days.  CT renal stone study was obtained that showed severe wall thickening and fat stranding, however no obvious bladder mass, calculi or hydronephrosis. She had a follow-up visit today and she was told by urology that they had no other options to offer.  Due to this, she was instructed to go to the ED.  ED course: On arrival to  the ED, patient was hypertensive at 156/95 with heart rate of 58.  She was saturating at 94 % on room air.  Initial workup remarkable for hemoglobin of 10.4, platelets of 477, creatinine of 1.11, AST of 56, total bilirubin 1.3 and GFR 56. Urinalysis was obtained that demonstrated large hemoglobin, moderate leukocytes, proteinuria and few bacteria.  CT of the abdomen was obtained That demonstrated mucosal enhancement of the bladder mild haziness concerning for cystitis.  No other acute findings noted. Due to intractable pain, [facility] contacted for admission.  Review of Systems: As mentioned in the history of present illness. All other systems reviewed and are negative.  Past Medical History:  Diagnosis Date   ABNORMAL VAGINAL BLEEDING 04/19/2007   Qualifier: Diagnosis of  By: Hulan Saas, CMA (AAMA), Larene Beach S    Acute cystitis without hematuria 01/31/2020   Acute extremity pain 11/04/2019   Acute pelvic pain, female 12/04/2021   Anemia    h/o as a child   Cancer ([place])    Complication of anesthesia    stays asleep for a "very long time"   Deep venous thrombosis ([place])    2006    3 x clotting    GERD (gastroesophageal reflux disease)    HEMORRHOIDS, WITH BLEEDING 12/26/2008   Qualifier: Diagnosis of  By: Regis Bill MD, Standley Brooking    PE (pulmonary embolism) 07/09/2010   Pre-diabetes    Pulmonary embolism ([place])    Past Surgical History:  Procedure Laterality Date   MYOMECTOMY     rectal tear  TRANSURETHRAL RESECTION OF BLADDER TUMOR N/A 04/24/2020   Procedure: TRANSURETHRAL RESECTION OF BLADDER TUMOR (TURBT);  Surgeon: Abbie Sons, MD;  Location: [facility] ORS;  Service: Urology;  Laterality: N/A;   TRANSURETHRAL RESECTION OF BLADDER TUMOR N/A 05/15/2020   Procedure: TRANSURETHRAL RESECTION OF BLADDER TUMOR (TURBT);  Surgeon: Abbie Sons, MD;  Location: [facility] ORS;  Service: Urology;  Laterality: N/A;   TRANSURETHRAL RESECTION OF BLADDER TUMOR N/A 12/04/2020   Procedure: TRANSURETHRAL RESECTION  OF BLADDER TUMOR (TURBT);  Surgeon: Abbie Sons, MD;  Location: [facility] ORS;  Service: Urology;  Laterality: N/A;   TUBAL LIGATION     vena caval filter     Social History:  reports that she has quit smoking. Her smoking use included cigarettes. She has been exposed to tobacco smoke. She has never used smokeless tobacco. She reports that she does not currently use alcohol. She reports that she does not use drugs.  Allergies  Allergen Reactions   Clindamycin/Lincomycin     "unknown"   Tape Other (See Comments)    When pt has a band-aid on, the impression of band-aid stays on for a month    Family History  Problem Relation Age of Onset   Hypertension Mother    Rheum arthritis Mother    Lung cancer Father 78       april 12    Pancreatic cancer Father 68   Diabetes Paternal Aunt        x2   Colon cancer Neg Hx    Colon polyps Neg Hx    Kidney disease Neg Hx    Esophageal cancer Neg Hx    Gallbladder disease Neg Hx    Rectal cancer Neg Hx    Stomach cancer Neg Hx    Breast cancer Neg Hx     Prior to Admission medications   Medication Sig Start Date End Date Taking? Authorizing Provider  cyclobenzaprine (FLEXERIL) 10 MG tablet Take 1 tablet (10 mg total) by mouth 3 (three) times daily as needed for muscle spasms. 07/07/22   Zara Council A, PA-C  diazepam (DIASTAT) 2.5 MG GEL Place 2.5 mg rectally once for 1 dose. 07/07/22 07/07/22  Zara Council A, PA-C  doxycycline (VIBRAMYCIN) 100 MG capsule Take 1 capsule (100 mg total) by mouth every 12 (twelve) hours. 07/02/22   Zara Council A, PA-C  estradiol (ESTRACE VAGINAL) 0.1 MG/GM vaginal cream Place 1 Applicatorful vaginally at bedtime. 1/3 of applicator every other night x 1 month. Then 2 times a week 07/03/22   Philip Aspen, CNM  gabapentin (NEURONTIN) 100 MG capsule Take 2 capsules (200 mg total) by mouth 3 (three) times daily. For pain 07/03/22   Pleas Koch, NP  hyoscyamine (LEVSIN) 0.125 MG tablet Take 1 tablet  (0.125 mg total) by mouth every 4 (four) hours as needed. 06/23/22   Zara Council A, PA-C  lidocaine (XYLOCAINE) 5 % ointment Apply 1 application topically as needed. 05/19/21   Sherrell Puller, PA-C  metroNIDAZOLE (FLAGYL) 500 MG tablet Take 1 tablet (500 mg total) by mouth 2 (two) times daily for 7 days. 07/04/22 07/11/22  Philip Aspen, CNM  Na Sulfate-K Sulfate-Mg Sulf 17.5-3.13-1.6 GM/177ML SOLN 1 colonscopy prep per GI office instructions 06/12/22   Vladimir Crofts, PA-C  oxybutynin (DITROPAN XL) 15 MG 24 hr tablet Take 1 tablet (15 mg total) by mouth daily. 07/09/22   McGowan, Hunt Oris, PA-C  oxyCODONE-acetaminophen (PERCOCET) 5-325 MG tablet Take 1 tablet by mouth every 6 (six) hours as  needed for severe pain. 07/03/22   Philip Aspen, CNM  polyethylene glycol (MIRALAX / GLYCOLAX) 17 g packet Take 17 g by mouth daily as needed for moderate constipation. 06/09/22   Pleas Koch, NP  predniSONE (STERAPRED UNI-PAK 21 TAB) 10 MG (21) TBPK tablet Take 6 tablets day 1, take 5 tablets day 2, take 4 tablets day 3, take 3 tablets day 4, take 2 tablets day 5, and take 1 tablet day 6 07/07/22   McGowan, Larene Beach A, PA-C  Vibegron (GEMTESA) 75 MG TABS Take 1 tablet (75 mg total) by mouth daily. 06/19/22   Zara Council A, PA-C  warfarin (COUMADIN) 5 MG tablet Take 1 and 1/2 tablet by mouth everyday. 06/01/22   Pleas Koch, NP    Physical Exam: Vitals:   07/09/22 0941 07/09/22 0945  BP: (!) 156/95   Pulse: (!) 58   Resp: 20   Temp: 98.2 F (36.8 C)   TempSrc: Oral   SpO2: 94%   Weight: 95.3 kg 95 kg  Height: 5' 5"$  (1.651 m) 5' 5"$  (1.651 m)   Physical Exam Vitals and nursing note reviewed.  Constitutional:      General: She is not in acute distress.    Appearance: She is normal weight. She is not toxic-appearing.  HENT:     Head: Normocephalic and atraumatic.  Cardiovascular:     Rate and Rhythm: Normal rate and regular rhythm.     Heart sounds: No murmur heard. Pulmonary:      Effort: Pulmonary effort is normal. No respiratory distress.  Abdominal:     General: Bowel sounds are normal. There is no distension.     Palpations: Abdomen is soft.     Tenderness: There is abdominal tenderness (Mild tenderness in the suprapubic region). There is no guarding.  Skin:    General: Skin is warm and dry.  Neurological:     General: No focal deficit present.     Mental Status: She is alert and oriented to person, place, and time. Mental status is at baseline.  Psychiatric:        Mood and Affect: Mood normal.        Behavior: Behavior normal.        Thought Content: Thought content normal.        Judgment: Judgment normal.    Data Reviewed: CBC with WBC of 8.2, hemoglobin of 10.4 and platelets of 477 CMP with sodium of 140, potassium 4.5, bicarb 24, glucose 114, BUN 20, creatinine 1.11, AST 56, ALT 37, total bilirubin of 1.3 and GFR 56  Urinalysis with large hemoglobin, moderate leukocytes, significant proteinuria, increase specific gravity and few bacteria in addition to over 50 RBC per hpf and WBC per hpf.  Notable that increased squamous epithelial noted.  CT Abdomen Pelvis W Contrast  Result Date: 07/09/2022 CLINICAL DATA:  Bladder cancer. Pelvic pain. Bladder spasms. * Tracking Code: BO * EXAM: CT ABDOMEN AND PELVIS WITH CONTRAST TECHNIQUE: Multidetector CT imaging of the abdomen and pelvis was performed using the standard protocol following bolus administration of intravenous contrast. RADIATION DOSE REDUCTION: This exam was performed according to the departmental dose-optimization program which includes automated exposure control, adjustment of the mA and/or kV according to patient size and/or use of iterative reconstruction technique. CONTRAST:  19m OMNIPAQUE IOHEXOL 300 MG/ML  SOLN COMPARISON:  None Available. FINDINGS: Lower chest: Lung bases are clear. Hepatobiliary: No focal hepatic lesion. Normal gallbladder. No biliary duct dilatation. Common bile duct is  normal. Pancreas:  Pancreas is normal. No ductal dilatation. No pancreatic inflammation. Spleen: Normal spleen Adrenals/urinary tract: Adrenal glands normal. No hydronephrosis. No ureterolithiasis or obstructive uropathy. The bladder is predominantly collapsed. There is thin mucosal enhancement is within the bladder. There is mild haziness along the adventitial surface of the bladder (sagittal image 53/series 7) no evidence of bladder mass. Stomach/Bowel: Moderate size hiatal hernia. Stomach, duodenum and small-bowel normal. Appendix is normal. The colon and rectosigmoid colon are normal. Vascular/Lymphatic: Abdominal aorta normal caliber. Infrarenal IVC filter noted. No retroperitoneal lymphadenopathy. No pelvic lymphadenopathy. Small iliac lymph nodes measuring 5-6 mm on LEFT and RIGHT unchanged from prior. Reproductive: Uterus and ovaries grossly normal by CT. Other: No free fluid. Musculoskeletal: No aggressive osseous lesion. IMPRESSION: 1. No evidence of bladder cancer recurrence or metastasis. Small iliac lymph nodes are unchanged. 2. Mucosal enhancement of the bladder and mild haziness along the adventitial surface of the bladder. Findings suggest cystitis. No mass identified. 3. No enhancing renal lesion.  No obstructive uropathy. 4. Moderate size hiatal hernia. Electronically Signed   By: Suzy Bouchard M.D.   On: 07/09/2022 12:43    There are no new results to review at this time.  Assessment and Plan:  * Bladder pain Patient is ending with 1 month history of severe bladder pain both during urination and at rest that began during a BCG infusion.  Multiple attempts have been made for alleviation of pain including multiple rounds of antibiotics despite urine culture demonstrating normal urothelial flora, Flexeril, gabapentin, prednisone, and low-dose oxycodone.  Her pain remains uncontrolled at this time.  I discussed with Theresa Lewis that we can uptitrate her oxycodone to provide her relief, but  her primary care physician or urology will need to manage this prescription until she can follow-up with either the pain clinic or another urological specialist.  She expressed understanding.  - Start Percocet 7.5 mg every 6 hours as needed.  Titrate for pain relief - Morphine 4 mg every 4 hours as needed for breakthrough pain - Tylenol 1000 mg every 8 hours scheduled - Follow-up urine culture obtained today.  Hold off on any further antibiotics given prior negative cultures  Urothelial carcinoma of bladder ([place]) Patient is currently undergoing maintenance BCG infusions.  Last infusion was on 06/13/2022.  Antiphospholipid antibody syndrome ([facility]) - Warfarin per pharmacy dosing  Advance Care Planning:   Code Status: Full Code verified by patient  Consults: None  Family Communication: Patient's husband updated at bedside  Severity of Illness: The appropriate patient status for this patient is OBSERVATION. Observation status is judged to be reasonable and necessary in order to provide the required intensity of service to ensure the patient's safety. The patient's presenting symptoms, physical exam findings, and initial radiographic and laboratory data in the context of their medical condition is felt to place them at decreased risk for further clinical deterioration. Furthermore, it is anticipated that the patient will be medically stable for discharge from the hospital within 2 midnights of admission.   Author: Jose Persia, MD 07/09/2022 3:25 PM  For on call review [email or web address].

## 2022-07-09 NOTE — ED Notes (Signed)
Spoke with [facility] EDP, pt will be seen on main side due to concerns noted in urologist note from this morning.

## 2022-07-09 NOTE — ED Notes (Signed)
First nurse note: Pt brought to ED from urology, pt has bladder cancer with bladder spasms and severe pain. Multiple pain medications have been tried. Pt crying.

## 2022-07-09 NOTE — Addendum Note (Signed)
Addended by: Despina Hidden on: 07/09/2022 09:41 AM   Modules accepted: Orders

## 2022-07-10 ENCOUNTER — Ambulatory Visit: Payer: BC Managed Care – PPO

## 2022-07-10 DIAGNOSIS — R3989 Other symptoms and signs involving the genitourinary system: Secondary | ICD-10-CM | POA: Diagnosis not present

## 2022-07-10 LAB — PROTIME-INR
INR: 2.9 — ABNORMAL HIGH (ref 0.8–1.2)
Prothrombin Time: 29.7 seconds — ABNORMAL HIGH (ref 11.4–15.2)

## 2022-07-10 LAB — HIV ANTIBODY (ROUTINE TESTING W REFLEX): HIV Screen 4th Generation wRfx: NONREACTIVE

## 2022-07-10 MED ORDER — FLUCONAZOLE 150 MG PO TABS: 150.0000 mg | ORAL_TABLET | Freq: Every day | ORAL | 0 refills | Status: DC

## 2022-07-10 MED ORDER — PHENAZOPYRIDINE HCL 100 MG PO TABS: 100.0000 mg | ORAL_TABLET | Freq: Three times a day (TID) | ORAL | 0 refills | Status: AC

## 2022-07-10 MED ORDER — PHENAZOPYRIDINE HCL 100 MG PO TABS
100.0000 mg | ORAL_TABLET | Freq: Three times a day (TID) | ORAL | Status: DC
  Administered 2022-07-10: 100 mg via ORAL
  Filled 2022-07-10 (×2): qty 1

## 2022-07-10 MED ORDER — WARFARIN SODIUM 7.5 MG PO TABS: 7.5000 mg | ORAL_TABLET | Freq: Once | ORAL | Status: DC

## 2022-07-10 NOTE — Consult Note (Signed)
ANTICOAGULATION CONSULT NOTE - Initial Consult  Pharmacy Consult for warfarin dosing Indication: Antiphospholipid syndrome  Allergies  Allergen Reactions   Clindamycin/Lincomycin     "unknown"   Tape Other (See Comments)    When pt has a band-aid on, the impression of band-aid stays on for a month    Patient Measurements: Height: 5' 5"$  (165.1 cm) Weight: 95 kg (209 lb 7 oz) IBW/kg (Calculated) : 57  Vital Signs: Temp: 98.1 F (36.7 C) (02/15 0500) Temp Source: Oral (02/15 0500) BP: 107/60 (02/15 0500) Pulse Rate: 60 (02/15 0500)  Labs: Recent Labs    07/09/22 1055 07/09/22 1554 07/10/22 0408  HGB 10.4*  --   --   HCT 34.3*  --   --   PLT 477*  --   --   LABPROT  --  26.0* 29.7*  INR  --  2.4* 2.9*  CREATININE 1.11*  --   --      Estimated Creatinine Clearance: 59.9 mL/min (A) (by C-G formula based on SCr of 1.11 mg/dL (H)).   Medical History: Past Medical History:  Diagnosis Date   ABNORMAL VAGINAL BLEEDING 04/19/2007   Qualifier: Diagnosis of  By: Hulan Saas, CMA (AAMA), Larene Beach S    Acute cystitis without hematuria 01/31/2020   Acute extremity pain 11/04/2019   Acute pelvic pain, female 12/04/2021   Anemia    h/o as a child   Cancer ([place])    Complication of anesthesia    stays asleep for a "very long time"   Deep venous thrombosis ([place])    2006    3 x clotting    GERD (gastroesophageal reflux disease)    HEMORRHOIDS, WITH BLEEDING 12/26/2008   Qualifier: Diagnosis of  By: Regis Bill MD, Standley Brooking    PE (pulmonary embolism) 07/09/2010   Pre-diabetes    Pulmonary embolism ([facility])     Medications:  Patient's home warfarin dose 7.5 mg po daily.  Reports last dose taken 07/08/22.  Assessment: 62 yo female presented to the ED with complaint of bladder pain.  Patient takes warfarin at home for antiphospholipid syndrome.  Pharmacy has been consulted to dose warfarin while hospitalized.  Goal of Therapy:  INR 2.5-3.5 per patient Monitor platelets by  anticoagulation protocol: Yes   Date INR Warfarin Dose  2/14 2.4 7.5 mg  2/15 2.9 7.5 mg     Plan:  --Give Warfarin 7.5 mg po x 1 (home dose) --Check daily PT/INR  Pearla Dubonnet, PharmD 07/10/2022,7:55 AM

## 2022-07-10 NOTE — Discharge Instructions (Signed)
Advised to follow-up with Urology Dr. Bernardo Heater as scheduled. Advised to take Diflucan x 2, 72 hours apart.  X 2 doses. Advised to complete doxycycline and metronidazole as prescribed for 7 days. Advised to take Pyridium 3 times daily for 2 days.

## 2022-07-10 NOTE — Progress Notes (Signed)
Discharge instructions given and went over with patient at bedside. Orma Flaming, RN

## 2022-07-10 NOTE — TOC CM/SW Note (Signed)
  Transition of Care [facility]) Screening Note   Patient Details  Name: Theresa Lewis Date of Birth: 08/12/1960   Transition of Care [facility]) CM/SW Contact:    Gerilyn Pilgrim, LCSW Phone Number: 07/10/2022, 9:23 AM    Transition of Care Department [facility]) has reviewed patient and no TOC needs have been identified at this time. We will continue to monitor patient advancement through interdisciplinary progression rounds. If new patient transition needs arise, please place a TOC consult.

## 2022-07-10 NOTE — Discharge Summary (Signed)
Physician Discharge Summary  DAMITRA SANTRY [id number] DOB: 02/03/1961 DOA: 07/09/2022  PCP: Pleas Koch, NP  Admit date: 07/09/2022  Discharge date: 07/10/2022  Admitted From: Home  Disposition:  Home.  Recommendations for Outpatient Follow-up:  Follow up with PCP in 1-2 weeks. Please obtain BMP/CBC in one week. Advised to follow-up with Urology Dr. Bernardo Heater as scheduled. Advised to take Diflucan x 2, 72 hours apart.  X 2 doses. Advised to complete doxycycline and metronidazole as prescribed for 7 days. Advised to take Pyridium 3 times daily for 2 days.  Home Health:None Equipment/Devices:None  Discharge Condition: Stable CODE STATUS:Full code Diet recommendation: Heart Healthy  Brief Summary/ Hospital Course: Theresa Lewis is a 62 y.o. 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 Mrs. Edholm states that approximately 1 month ago, she was receiving her usual maintenance BCG infusion, when midway through, she had sudden onset severe urethral and bladder pain.  Since then, she has persistent bladder pain throughout the day that is significantly exacerbated when urinating. She states that when she urinates, she has dysuria but then feels a " hot searing pain" shoot into her bladder.  The pain has persisted for the last 4 weeks without any improvement.  She endorses increased urinary frequency and urgency.  She denies any other symptoms including fever, chills, nausea, vomiting, abdominal pain, diarrhea.  She has been eating and drinking less often due to this pain as she has decreased appetite.   Per chart review, Patient has followed up with urology and OB/GYN in regards to this pain. She has been trialed on multiple rounds of antibiotics due to concern for UTI, lidocaine instillation, gabapentin, prednisone,  Flexeril, and oxycodone.  She was last given 10 tablets of oxycodone on 07/03/2022 and told to take 1 every 12 hours for total course of 5 days.  CT renal stone study was obtained that showed severe wall thickening and fat stranding, however no obvious bladder mass, calculi or hydronephrosis. She had a follow-up visit today and she was told by urology that they had no other options to offer.  Due to this, she was instructed to go to the ED.   ED course: On arrival to the ED, patient was hypertensive at 156/95 with heart rate of 58.  She was saturating at 94 % on room air.  Initial workup remarkable for hemoglobin of 10.4, platelets of 477, creatinine of 1.11, AST of 56, total bilirubin 1.3 and GFR 56. Urinalysis was obtained that demonstrated large hemoglobin, moderate leukocytes, proteinuria and few bacteria.  CT of the abdomen was obtained that demonstrated mucosal enhancement of the bladder mild haziness concerning for cystitis.  No other acute findings noted. Due to intractable pain, [facility] contacted for admission.  Patient was admitted for further evaluation.  She was given Pyridium which has improved her pain.  She was not started on antibiotic given she was recently completing the course of antibiotics for UTI.  Patient has felt much better.  She was given 2 doses of Diflucan for Candida.  Patient feels better wants to be discharged.  Patient is being discharged home advised to follow-up with urology. Workup so far negative.   Discharge Diagnoses:  Principal Problem:   Bladder pain Active Problems:   Antiphospholipid antibody syndrome (HCC)   Urothelial carcinoma of bladder [facility])  Discharge Instructions:   Discharge Instructions  Call MD for:  difficulty breathing, headache or visual disturbances   Complete by: As directed    Call MD for:  persistant nausea and vomiting   Complete by: As directed    Diet - low sodium heart healthy   Complete by: As directed    Diet Carb Modified    Complete by: As directed    Discharge instructions   Complete by: As directed    Advised to follow-up with primary care physician in 1 week. Advised to follow-up with Urology Dr. Bernardo Heater as scheduled. Advised to take Diflucan x 2, 72 hours apart.  X 2 doses. Advised to complete doxycycline and metronidazole as prescribed for 7 days. Advised to take Pyridium 3 times daily for 2 days.   Increase activity slowly   Complete by: As directed       Allergies as of 07/10/2022       Reactions   Clindamycin/lincomycin    "unknown"   Tape Other (See Comments)   When pt has a band-aid on, the impression of band-aid stays on for a month        Medication List     STOP taking these medications    diazepam 2.5 MG Gel Commonly known as: DIASTAT   lidocaine 5 % ointment Commonly known as: XYLOCAINE       TAKE these medications    cyclobenzaprine 10 MG tablet Commonly known as: FLEXERIL Take 1 tablet (10 mg total) by mouth 3 (three) times daily as needed for muscle spasms.   doxycycline 100 MG capsule Commonly known as: VIBRAMYCIN Take 1 capsule (100 mg total) by mouth every 12 (twelve) hours.   estradiol 0.1 MG/GM vaginal cream Commonly known as: ESTRACE VAGINAL Place 1 Applicatorful vaginally at bedtime. 1/3 of applicator every other night x 1 month. Then 2 times a week What changed: additional instructions   fluconazole 150 MG tablet Commonly known as: Diflucan Take 1 tablet (150 mg total) by mouth daily.   gabapentin 100 MG capsule Commonly known as: NEURONTIN Take 2 capsules (200 mg total) by mouth 3 (three) times daily. For pain   Gemtesa 75 MG Tabs Generic drug: Vibegron Take 1 tablet (75 mg total) by mouth daily.   hyoscyamine 0.125 MG tablet Commonly known as: Levsin Take 1 tablet (0.125 mg total) by mouth every 4 (four) hours as needed.   metroNIDAZOLE 500 MG tablet Commonly known as: FLAGYL Take 1 tablet (500 mg total) by mouth 2 (two) times daily for 7  days.   Na Sulfate-K Sulfate-Mg Sulf 17.5-3.13-1.6 GM/177ML Soln 1 colonscopy prep per GI office instructions   oxybutynin 15 MG 24 hr tablet Commonly known as: DITROPAN XL Take 1 tablet (15 mg total) by mouth daily.   oxyCODONE-acetaminophen 5-325 MG tablet Commonly known as: Percocet Take 1 tablet by mouth every 6 (six) hours as needed for severe pain.   phenazopyridine 100 MG tablet Commonly known as: PYRIDIUM Take 1 tablet (100 mg total) by mouth 3 (three) times daily with meals for 2 days.   polyethylene glycol 17 g packet Commonly known as: MIRALAX / GLYCOLAX Take 17 g by mouth daily as needed for moderate constipation.   predniSONE 10 MG (21) Tbpk tablet Commonly known as: STERAPRED UNI-PAK 21 TAB Take 6 tablets day 1, take 5 tablets day 2, take 4 tablets day 3, take 3 tablets day 4, take 2 tablets day 5, and take 1 tablet day 6   warfarin 5 MG tablet Commonly known as: COUMADIN Take  as directed. If you are unsure how to take this medication, talk to your nurse or doctor. Original instructions: Take 1 and 1/2 tablet by mouth everyday.        Follow-up Information     Pleas Koch, NP Follow up in 1 week(s).   Specialty: Internal Medicine Contact information: [place] [address] [phone]         Abbie Sons, MD Follow up in 1 week(s).   Specialty: Urology Contact information: [place] [address] [phone]                Allergies  Allergen Reactions   Clindamycin/Lincomycin     "unknown"   Tape Other (See Comments)    When pt has a band-aid on, the impression of band-aid stays on for a month    Consultations: None   Procedures/Studies: CT Abdomen Pelvis W Contrast  Result Date: 07/09/2022 CLINICAL DATA:  Bladder cancer. Pelvic pain. Bladder spasms. * Tracking Code: BO * EXAM: CT ABDOMEN AND PELVIS WITH CONTRAST TECHNIQUE: Multidetector CT imaging of the abdomen and  pelvis was performed using the standard protocol following bolus administration of intravenous contrast. RADIATION DOSE REDUCTION: This exam was performed according to the departmental dose-optimization program which includes automated exposure control, adjustment of the mA and/or kV according to patient size and/or use of iterative reconstruction technique. CONTRAST:  19m OMNIPAQUE IOHEXOL 300 MG/ML  SOLN COMPARISON:  None Available. FINDINGS: Lower chest: Lung bases are clear. Hepatobiliary: No focal hepatic lesion. Normal gallbladder. No biliary duct dilatation. Common bile duct is normal. Pancreas: Pancreas is normal. No ductal dilatation. No pancreatic inflammation. Spleen: Normal spleen Adrenals/urinary tract: Adrenal glands normal. No hydronephrosis. No ureterolithiasis or obstructive uropathy. The bladder is predominantly collapsed. There is thin mucosal enhancement is within the bladder. There is mild haziness along the adventitial surface of the bladder (sagittal image 53/series 7) no evidence of bladder mass. Stomach/Bowel: Moderate size hiatal hernia. Stomach, duodenum and small-bowel normal. Appendix is normal. The colon and rectosigmoid colon are normal. Vascular/Lymphatic: Abdominal aorta normal caliber. Infrarenal IVC filter noted. No retroperitoneal lymphadenopathy. No pelvic lymphadenopathy. Small iliac lymph nodes measuring 5-6 mm on LEFT and RIGHT unchanged from prior. Reproductive: Uterus and ovaries grossly normal by CT. Other: No free fluid. Musculoskeletal: No aggressive osseous lesion. IMPRESSION: 1. No evidence of bladder cancer recurrence or metastasis. Small iliac lymph nodes are unchanged. 2. Mucosal enhancement of the bladder and mild haziness along the adventitial surface of the bladder. Findings suggest cystitis. No mass identified. 3. No enhancing renal lesion.  No obstructive uropathy. 4. Moderate size hiatal hernia. Electronically Signed   By: SSuzy BouchardM.D.   On:  07/09/2022 12:43   CT RENAL STONE STUDY  Result Date: 06/19/2022 CLINICAL DATA:  Abdominal and flank pain, stone suspected, history of bladder cancer status post TURBT * Tracking Code: BO * EXAM: CT ABDOMEN AND PELVIS WITHOUT CONTRAST TECHNIQUE: Multidetector CT imaging of the abdomen and pelvis was performed following the standard protocol without IV contrast. RADIATION DOSE REDUCTION: This exam was performed according to the departmental dose-optimization program which includes automated exposure control, adjustment of the mA and/or kV according to patient size and/or use of iterative reconstruction technique. COMPARISON:  12/02/2021 FINDINGS: Lower chest: No acute abnormality. Moderate hiatal hernia with intrathoracic position of the gastric fundus. Hepatobiliary: No solid liver abnormality is seen. No gallstones, gallbladder wall thickening, or biliary dilatation. Pancreas: Unremarkable. No pancreatic ductal  dilatation or surrounding inflammatory changes. Spleen: Normal in size without significant abnormality. Adrenals/Urinary Tract: Adrenal glands are unremarkable. Kidneys are normal, without renal calculi, solid lesion, or hydronephrosis. Decompressed urinary bladder with severe wall thickening and fat stranding (series 6, image 67). Small focus of air within the bladder most likely related to recent catheterization. Stomach/Bowel: Stomach is within normal limits. Appendix appears normal. No evidence of bowel wall thickening, distention, or inflammatory changes. Cecal diverticula. Vascular/Lymphatic: Infrarenal IVC filter. No enlarged abdominal or pelvic lymph nodes. Reproductive: No mass or other significant abnormality. Other: No abdominal wall hernia or abnormality. No ascites. Musculoskeletal: No acute or significant osseous findings. IMPRESSION: 1. Decompressed urinary bladder with severe wall thickening and fat stranding, consistent with nonspecific infectious or inflammatory cystitis. Correlate with  urinalysis. 2. No obvious bladder mass, lymphadenopathy, or metastatic disease in the abdomen or pelvis on noncontrast CT. 3. No urinary tract calculi or hydronephrosis. 4. Moderate hiatal hernia. 5. Cecal diverticulosis without evidence of acute diverticulitis. These results will be called to the ordering clinician or representative by the Radiologist Assistant, and communication documented in the PACS or [employer]. Electronically Signed   By: Delanna Ahmadi M.D.   On: 06/19/2022 14:55     Subjective: Patient is seen and examined at bedside.  Overnight events noted.   Patient report doing much better and wants to be discharged.  Patient being discharged home  Discharge Exam: Vitals:   07/09/22 2000 07/10/22 0500  BP: 123/69 107/60  Pulse: 63 60  Resp: 18 18  Temp: 98 F (36.7 C) 98.1 F (36.7 C)  SpO2: 98% 99%   Vitals:   07/09/22 1528 07/09/22 1529 07/09/22 2000 07/10/22 0500  BP: 138/88  123/69 107/60  Pulse: 65  63 60  Resp: 18  18 18  $ Temp:  98.4 F (36.9 C) 98 F (36.7 C) 98.1 F (36.7 C)  TempSrc:  Oral Oral Oral  SpO2: 96%  98% 99%  Weight:      Height:        General: Pt is alert, awake, not in acute distress Cardiovascular: RRR, S1/S2 +, no rubs, no gallops Respiratory: CTA bilaterally, no wheezing, no rhonchi Abdominal: Soft, NT, ND, bowel sounds + Extremities: no edema, no cyanosis    The results of significant diagnostics from this hospitalization (including imaging, microbiology, ancillary and laboratory) are listed below for reference.     Microbiology: No results found for this or any previous visit (from the past 240 hour(s)).   Labs: BNP (last 3 results) No results for input(s): "BNP" in the last 8760 hours. Basic Metabolic Panel: Recent Labs  Lab 07/09/22 1055  NA 140  K 4.5  CL 108  CO2 24  GLUCOSE 114*  BUN 20  CREATININE 1.11*  CALCIUM 9.6   Liver Function Tests: Recent Labs  Lab 07/09/22 1055  AST 56*  ALT 37  ALKPHOS  67  BILITOT 1.3*  PROT 8.0  ALBUMIN 4.0   No results for input(s): "LIPASE", "AMYLASE" in the last 168 hours. No results for input(s): "AMMONIA" in the last 168 hours. CBC: Recent Labs  Lab 07/09/22 1055  WBC 8.2  NEUTROABS 6.2  HGB 10.4*  HCT 34.3*  MCV 76.7*  PLT 477*   Cardiac Enzymes: No results for input(s): "CKTOTAL", "CKMB", "CKMBINDEX", "TROPONINI" in the last 168 hours. BNP: Invalid input(s): "POCBNP" CBG: No results for input(s): "GLUCAP" in the last 168 hours. D-Dimer No results for input(s): "DDIMER" in the last 72 hours. Hgb A1c No  results for input(s): "HGBA1C" in the last 72 hours. Lipid Profile No results for input(s): "CHOL", "HDL", "LDLCALC", "TRIG", "CHOLHDL", "LDLDIRECT" in the last 72 hours. Thyroid function studies No results for input(s): "TSH", "T4TOTAL", "T3FREE", "THYROIDAB" in the last 72 hours.  Invalid input(s): "FREET3" Anemia work up No results for input(s): "VITAMINB12", "FOLATE", "FERRITIN", "TIBC", "IRON", "RETICCTPCT" in the last 72 hours. Urinalysis    Component Value Date/Time   COLORURINE AMBER (A) 07/09/2022 1108   APPEARANCEUR CLOUDY (A) 07/09/2022 1108   APPEARANCEUR Hazy (A) 06/18/2022 1420   LABSPEC 1.034 (H) 07/09/2022 1108   PHURINE 5.0 07/09/2022 1108   GLUCOSEU NEGATIVE 07/09/2022 1108   HGBUR LARGE (A) 07/09/2022 1108   HGBUR trace-lysed 10/04/2007 1050   BILIRUBINUR NEGATIVE 07/09/2022 1108   BILIRUBINUR Negative 06/18/2022 1420   KETONESUR NEGATIVE 07/09/2022 1108   PROTEINUR >=300 (A) 07/09/2022 1108   UROBILINOGEN 2.0 (A) 03/16/2020 1235   UROBILINOGEN 0.2 10/04/2007 1050   NITRITE NEGATIVE 07/09/2022 1108   LEUKOCYTESUR MODERATE (A) 07/09/2022 1108   Sepsis Labs Recent Labs  Lab 07/09/22 1055  WBC 8.2   Microbiology No results found for this or any previous visit (from the past 240 hour(s)).   Time coordinating discharge: Over 30 minutes  SIGNED:   Shawna Clamp, MD  [facility] 07/10/2022, 3:37 PM Pager   If 7PM-7AM, please contact night-coverage

## 2022-07-11 ENCOUNTER — Encounter: Payer: Self-pay | Admitting: Primary Care

## 2022-07-11 ENCOUNTER — Ambulatory Visit (INDEPENDENT_AMBULATORY_CARE_PROVIDER_SITE_OTHER): Payer: BC Managed Care – PPO | Admitting: Primary Care

## 2022-07-11 VITALS — BP 122/84 | HR 64 | Temp 97.3°F | Ht 65.0 in | Wt 210.0 lb

## 2022-07-11 DIAGNOSIS — R102 Pelvic and perineal pain unspecified side: Secondary | ICD-10-CM

## 2022-07-11 MED ORDER — GABAPENTIN 300 MG PO CAPS: 300.0000 mg | ORAL_CAPSULE | Freq: Three times a day (TID) | ORAL | 0 refills | Status: AC

## 2022-07-11 NOTE — Progress Notes (Signed)
Subjective:    Patient ID: Theresa Lewis, female    DOB: 02/22/1961, 62 y.o.   MRN: [id number]  HPI  [name] is a very pleasant 62 y.o. female with a history of urothelial bladder carcinoma, antiphospholipid antibody syndrome who presents today for hospital follow up.   She presented to [facility] ED on 07/09/22 directly from her Urologist's office for severe suprapubic and urethral pain that was unresponsive to bladder rescue solution x 2, gabapentin, multiple rounds of antibiotics, oxycodone, diflucan, metronidazole. Previously she underwent CT renal stone study which showed bladder wall thickening with fat stranding but was without bladder mass. During her stay in the ED she was treated with IV Rocephin, IV morphine/zofran, IV fluids. She was admitted for pain management.   During her hospital stay she was treated with Percocet, IV Morphine, and Tylenol PRN. UA revealed moderate leuks with >50 WBC's, no further antibiotics provided given her prior treatment. Her pain improved greatly with addition of Pyridium so she was discharged home on 07/10/22 with prescriptions for Diflucan x 2 tablets, separated 72 hours apart, Pyridum, and for PCP/Urology follow up.  Since her discharge home she's feeling better but she continues to experience a constant "pulling" sensation on her urethra, worse with urination. She also experiences intermittent sharp, knifelike pain up through her pelvis which is what promoted her ED and hospital visit. She's not had this pain since her admission but is very afraid it will return. She cannot function with this level of pain.  She continues to experience urinary frequency every 20 minutes. She's not sleeping due to her urinary frequency and she's unable to eat much. She can work overall as long as she does not have her "knifelike pain".  She is currently taking gabapentin 200 mg TID but this is no longer effective. She's also tried the Pyridium 100 mg TID and isn't  sure if this is working. She is taking all medications as prescribed from her hospital stay. Last BCG appointment was on 06/13/22 and symptoms really began just after this procedure. She has been referred to a bladder pain specialist but her appointment is not until July.     Review of Systems  Genitourinary:  Positive for frequency and urgency. Negative for dysuria and hematuria.  Musculoskeletal:  Positive for myalgias.         Past Medical History:  Diagnosis Date   ABNORMAL VAGINAL BLEEDING 04/19/2007   Qualifier: Diagnosis of  By: Hulan Saas, CMA (AAMA), Larene Beach S    Acute cystitis without hematuria 01/31/2020   Acute extremity pain 11/04/2019   Acute pelvic pain, female 12/04/2021   Anemia    h/o as a child   Cancer ([place])    Complication of anesthesia    stays asleep for a "very long time"   Deep venous thrombosis ([place])    2006    3 x clotting    GERD (gastroesophageal reflux disease)    HEMORRHOIDS, WITH BLEEDING 12/26/2008   Qualifier: Diagnosis of  By: Regis Bill MD, Standley Brooking    PE (pulmonary embolism) 07/09/2010   Pre-diabetes    Pulmonary embolism ([place])     Social History   Socioeconomic History   Marital status: Married    Spouse name: Not on file   Number of children: 1   Years of education: Not on file   Highest education level: Not on file  Occupational History   Occupation: student advocate   Tobacco Use   Smoking status: Former  Years: 4.00    Types: Cigarettes    Passive exposure: Past   Smokeless tobacco: Never  Vaping Use   Vaping Use: Never used  Substance and Sexual Activity   Alcohol use: Not Currently    Comment: rarely   Drug use: No   Sexual activity: Not on file  Other Topics Concern   Not on file  Social History Narrative   HH of 3  Daughter 68 and self.  hh of 2    No pets.   No ets.   No etoh.    Working for [employer] and [employer] .  45- 50 hours per week.                Social Determinants of Health   Financial Resource  Strain: Not on file  Food Insecurity: No Food Insecurity (07/09/2022)   Hunger Vital Sign    Worried About Running Out of Food in the Last Year: Never true    Ran Out of Food in the Last Year: Never true  Transportation Needs: No Transportation Needs (07/09/2022)   PRAPARE - Hydrologist (Medical): No    Lack of Transportation (Non-Medical): No  Physical Activity: Not on file  Stress: Not on file  Social Connections: Not on file  Intimate Partner Violence: Not At Risk (07/09/2022)   Humiliation, Afraid, Rape, and Kick questionnaire    Fear of Current or Ex-Partner: No    Emotionally Abused: No    Physically Abused: No    Sexually Abused: No    Past Surgical History:  Procedure Laterality Date   MYOMECTOMY     rectal tear     TRANSURETHRAL RESECTION OF BLADDER TUMOR N/A 04/24/2020   Procedure: TRANSURETHRAL RESECTION OF BLADDER TUMOR (TURBT);  Surgeon: Abbie Sons, MD;  Location: [facility] ORS;  Service: Urology;  Laterality: N/A;   TRANSURETHRAL RESECTION OF BLADDER TUMOR N/A 05/15/2020   Procedure: TRANSURETHRAL RESECTION OF BLADDER TUMOR (TURBT);  Surgeon: Abbie Sons, MD;  Location: [facility] ORS;  Service: Urology;  Laterality: N/A;   TRANSURETHRAL RESECTION OF BLADDER TUMOR N/A 12/04/2020   Procedure: TRANSURETHRAL RESECTION OF BLADDER TUMOR (TURBT);  Surgeon: Abbie Sons, MD;  Location: [facility] ORS;  Service: Urology;  Laterality: N/A;   TUBAL LIGATION     vena caval filter      Family History  Problem Relation Age of Onset   Hypertension Mother    Rheum arthritis Mother    Lung cancer Father 74       april 12    Pancreatic cancer Father 91   Diabetes Paternal Aunt        x2   Colon cancer Neg Hx    Colon polyps Neg Hx    Kidney disease Neg Hx    Esophageal cancer Neg Hx    Gallbladder disease Neg Hx    Rectal cancer Neg Hx    Stomach cancer Neg Hx    Breast cancer Neg Hx     Allergies  Allergen Reactions   Clindamycin/Lincomycin      "unknown"   Tape Other (See Comments)    When pt has a band-aid on, the impression of band-aid stays on for a month    Current Outpatient Medications on File Prior to Visit  Medication Sig Dispense Refill   cyclobenzaprine (FLEXERIL) 10 MG tablet Take 1 tablet (10 mg total) by mouth 3 (three) times daily as needed for muscle spasms. 30 tablet 0  doxycycline (VIBRAMYCIN) 100 MG capsule Take 1 capsule (100 mg total) by mouth every 12 (twelve) hours. 14 capsule 0   fluconazole (DIFLUCAN) 150 MG tablet Take 1 tablet (150 mg total) by mouth daily. 2 tablet 0   gabapentin (NEURONTIN) 100 MG capsule Take 2 capsules (200 mg total) by mouth 3 (three) times daily. For pain 180 capsule 0   metroNIDAZOLE (FLAGYL) 500 MG tablet Take 1 tablet (500 mg total) by mouth 2 (two) times daily for 7 days. 14 tablet 0   oxybutynin (DITROPAN XL) 15 MG 24 hr tablet Take 1 tablet (15 mg total) by mouth daily. 90 tablet 0   phenazopyridine (PYRIDIUM) 100 MG tablet Take 1 tablet (100 mg total) by mouth 3 (three) times daily with meals for 2 days. 6 tablet 0   polyethylene glycol (MIRALAX / GLYCOLAX) 17 g packet Take 17 g by mouth daily as needed for moderate constipation. 14 each 5   Vibegron (GEMTESA) 75 MG TABS Take 1 tablet (75 mg total) by mouth daily. 42 tablet 0   warfarin (COUMADIN) 5 MG tablet Take 1 and 1/2 tablet by mouth everyday. 135 tablet 0   estradiol (ESTRACE VAGINAL) 0.1 MG/GM vaginal cream Place 1 Applicatorful vaginally at bedtime. 1/3 of applicator every other night x 1 month. Then 2 times a week (Patient not taking: Reported on 07/11/2022) 42.5 g 12   hyoscyamine (LEVSIN) 0.125 MG tablet Take 1 tablet (0.125 mg total) by mouth every 4 (four) hours as needed. (Patient not taking: Reported on 07/11/2022) 30 tablet 0   Na Sulfate-K Sulfate-Mg Sulf 17.5-3.13-1.6 GM/177ML SOLN 1 colonscopy prep per GI office instructions (Patient not taking: Reported on 07/11/2022) 354 mL 0   No current  facility-administered medications on file prior to visit.    BP 122/84   Pulse 64   Temp (!) 97.3 F (36.3 C) (Temporal)   Ht 5' 5"$  (1.651 m)   Wt 210 lb (95.3 kg)   SpO2 95%   BMI 34.95 kg/m  Objective:   Physical Exam Cardiovascular:     Rate and Rhythm: Normal rate and regular rhythm.  Pulmonary:     Effort: Pulmonary effort is normal.     Breath sounds: Normal breath sounds.  Musculoskeletal:     Cervical back: Neck supple.  Skin:    General: Skin is warm and dry.           Assessment & Plan:  Pelvic pain Assessment & Plan: Acute since BCG treatment on 06/13/22.  Reviewed recent hospital notes, labs, imaging. Continue Pyridium, metronidazole, doxycycline, fluconazole until complete.  Increase Gabapentin to 300 mg TID.  Consider adding 100 mg 1-3 times daily if needed. She will update in 1 week.  Consider Cymbalta 20 mg daily.   Orders: -     Gabapentin; Take 1 capsule (300 mg total) by mouth 3 (three) times daily. For pain.  Dispense: 270 capsule; Refill: 0        Pleas Koch, NP

## 2022-07-11 NOTE — Assessment & Plan Note (Signed)
Acute since BCG treatment on 06/13/22.  Reviewed recent hospital notes, labs, imaging. Continue Pyridium, metronidazole, doxycycline, fluconazole until complete.  Increase Gabapentin to 300 mg TID.  Consider adding 100 mg 1-3 times daily if needed. She will update in 1 week.  Consider Cymbalta 20 mg daily.

## 2022-07-11 NOTE — Telephone Encounter (Signed)
Theresa Lewis, this sounds good. Thank you for your assessment and assistance with this patient!

## 2022-07-11 NOTE — Telephone Encounter (Signed)
Contacted pt and advised of medication interactions. Scheduled pt for 2/22 for INR check. Advised if any s/s of abnormal bruising or bleeding to contact the office or go to the ER. Pt verbalized understanding.

## 2022-07-11 NOTE — Telephone Encounter (Signed)
Pt has been taking three medication that have potential for interaction with warfarin and cause increased risk of bleeding.  Doxycycline was started on 07/02/22 and should now be finished. Metronidazole was started on 07/04/22 and should be finished today. Fluconazole was prescribed yesterday to take one tablet now and the other in 72 hours.   INR was checked in-pt yesterday and was 2.9, which is in range, 2.5-3.5.  Antibiotics did not seem to increase INR but fluconazole has a major interaction with warfarin and has always elevated INR.  Recommend reducing warfarin dose today to take 1 tablet and reduce dose on Sunday, 2/18 to take 1 tablet, then INR check on Thursday, 2/22.

## 2022-07-17 ENCOUNTER — Telehealth: Payer: Self-pay

## 2022-07-17 ENCOUNTER — Ambulatory Visit: Payer: BC Managed Care – PPO

## 2022-07-17 ENCOUNTER — Other Ambulatory Visit: Payer: Self-pay | Admitting: Urology

## 2022-07-17 ENCOUNTER — Other Ambulatory Visit: Payer: Self-pay | Admitting: Primary Care

## 2022-07-17 DIAGNOSIS — Z7901 Long term (current) use of anticoagulants: Secondary | ICD-10-CM

## 2022-07-17 DIAGNOSIS — N3289 Other specified disorders of bladder: Secondary | ICD-10-CM

## 2022-07-17 DIAGNOSIS — D6861 Antiphospholipid syndrome: Secondary | ICD-10-CM

## 2022-07-17 LAB — PROTIME-INR
INR: 7.7 ratio (ref 0.8–1.0)
Prothrombin Time: 74.7 s (ref 9.6–13.1)

## 2022-07-17 LAB — POCT INR: INR: 8 — AB (ref 2.0–3.0)

## 2022-07-17 MED ORDER — AMITRIPTYLINE HCL 50 MG PO TABS: 50.0000 mg | ORAL_TABLET | Freq: Every day | ORAL | 3 refills | Status: DC

## 2022-07-17 MED ORDER — HYDROXYZINE HCL 50 MG PO TABS: ORAL_TABLET | ORAL | 3 refills | Status: DC

## 2022-07-17 NOTE — Telephone Encounter (Signed)
[facility] called in with a critical lab result for patient:   Protime 74.7 INR 7.7

## 2022-07-17 NOTE — Patient Instructions (Addendum)
Pre visit review using our clinic review tool, if applicable. No additional management support is needed unless otherwise documented below in the visit note.  Hold all warfarin until contacted by coumadin clinic.

## 2022-07-17 NOTE — Telephone Encounter (Signed)
Contacted pt and advised to hold all warfarin and recheck INR on 2/26, Monday. Advised a lab apt will be made and they will perform an POCT INR. Advised this nurse will f/u with dosing instructions after result from 2/26 is received.  Pt still denies any abnormal bruising or bleeding. Advised if any abnormal bruising or bleeding to contact office or go to the ER or call EMS. Pt verbalized understanding.   Made lab apt for pt for 10 am on 2/26.

## 2022-07-17 NOTE — Telephone Encounter (Signed)
Noted and appreciate the follow up.

## 2022-07-17 NOTE — Progress Notes (Signed)
Doxycycline was started on 07/02/22 and should now be finished. Metronidazole was started on 07/04/22 and finished on 2/16. Fluconazole was prescribed 2/15 to take one tablet now and the other in 72 hours. All interact with warfarin. Decreased two warfarin doses last week.  Pt in hospital on 2/14 for one day for bladder pain due to infections. INR over 8 today with POCT test. Lab draw INR performed. Will receive results today. Pt advised to hold all warfarin until contacted by coumadin clinic.  Pt also revealed she is not taking gabapentin as prescribed. She is taking 600 mg QID. Reported to PCP, recommendation is to reduce dose to TID and if any pain to contact clinic. Pt denies any pain currently but does report she is "a little wobbly." She was observed as unstable on her feet and was helped to the room and to her car. Pt agrees to reducing dose to TID but is very concerned the pain will return. Advised pt to contact the clinic if she does experience pain returning. Pt denies any abnormal bruising or bleeding currently. Advised if any abnormal bruising or bleeding to contact the clinic, go to the ER or call EMS. Pt verbalized understanding.

## 2022-07-17 NOTE — Telephone Encounter (Signed)
This was expected given her recent use of fluconazole, metronidazole, and gabapentin.  Forwarding to [employer], Therapist, sports.

## 2022-07-18 ENCOUNTER — Other Ambulatory Visit: Payer: BC Managed Care – PPO | Admitting: Urology

## 2022-07-21 ENCOUNTER — Other Ambulatory Visit: Payer: BC Managed Care – PPO

## 2022-07-21 ENCOUNTER — Encounter: Payer: BC Managed Care – PPO | Admitting: Obstetrics and Gynecology

## 2022-07-21 ENCOUNTER — Telehealth: Payer: Self-pay

## 2022-07-21 ENCOUNTER — Ambulatory Visit (INDEPENDENT_AMBULATORY_CARE_PROVIDER_SITE_OTHER): Payer: BC Managed Care – PPO

## 2022-07-21 DIAGNOSIS — Z7901 Long term (current) use of anticoagulants: Secondary | ICD-10-CM

## 2022-07-21 LAB — POCT INR: INR: 1.4 — AB (ref 2.0–3.0)

## 2022-07-21 NOTE — Progress Notes (Cosign Needed Addendum)
INR 7.7 on 2/22 and pt was advised at that time to hold all warfarin until contacted by coumadin clinic on 2/26. Elevated INR due to medication interactions at the time. Pt had lab apt today for POCT INR and result was sent to coumadin clinic. INR today is 1.4 Change in medication since last INR on 2/22. Started amitriptyline and hydroxyzine for bladder pain. Amitriptyline increases bleeding risk, hydroxyzine does not interact with warfarin. Pt also reports she is scheduled for colonoscopy on 3/8 and GI office is to contact PCP for warfarin dosing around the procedure.  No change in weekly dose due to pt being fairly stable on this dose before medication interactions. Dose may need reduction with addition of amitriptyline. Will boost dose for 2 days and thePt reports she has not started either medication. She reports pharmacy is requesting urology change the script for hydroxyzine to a different medication. Pt said she has not taken any amitriptyline because she read it interacts with warfarin. Currently she denies any pain and is taking gabapentin TID and weaning herself off of it gradually. n resume normal dosing. Recheck INR next week. Increase dose today to take 2 tablets and increase dose tomorrow to take two tablets and then continue 1 1/2 tablets daily except take 2 tablets on Wednesdays. Recheck on 07/31/22. If any signs or symptoms of abnormal bruising or bleeding contact the office or go to the ER.  Contacted pt by phone and pt verbalized understanding. Scheduled apt for coumadin clinic for next week.

## 2022-07-21 NOTE — Telephone Encounter (Signed)
Contacted pt today with instructions for dosing of warfarin after INR check this morning with result of 1.4  Pt reports when contacted with dosing instructions that she is scheduled for colonoscopy on 3/8 and GI office said they would contact PCP office for warfarin instructions. Advised this nurse will f/u after discussing with PCP. Pt verbalized understanding.

## 2022-07-21 NOTE — Patient Instructions (Addendum)
Pre visit review using our clinic review tool, if applicable. No additional management support is needed unless otherwise documented below in the visit note.  Increase dose today to take 2 tablets and increase dose tomorrow to take two tablets and then continue 1 1/2 tablets daily except take 2 tablets on Wednesdays. Recheck on 07/31/22. If any signs or symptoms of abnormal bruising or bleeding contact the office or go to the ER.

## 2022-07-22 NOTE — Telephone Encounter (Signed)
Larene Beach,   I have not seen any faxes come through requesting this information.

## 2022-07-22 NOTE — Telephone Encounter (Signed)
This is the request for clearance for pt's EGD/colonoscopy on 3/8. This was started in Jan 2024.  Unsure why GI has not faxed PCP office for clearance. Will f/u with PCP on 2/29 regarding warfarin hold.

## 2022-07-22 NOTE — Telephone Encounter (Signed)
Noted. I have yet to see anything from GI. Will discuss later this week.

## 2022-07-24 NOTE — Telephone Encounter (Signed)
Pleas Koch, NP54 minutes ago (12:44 PM)    Noted and agree. Thank you for your diligent work for this patient!      Note   You  Alma Friendly K, NP5 hours ago (7:44 AM)    Hold schedule for pt's surgery on 3/8. Pt is currently scheduled for recheck of INR on 3/7, will RS apt for 3/14. If ok with hold schedule I will send it to GI. Thank you   You5 hours ago (7:43 AM)    Warfarin hold schedule for EGD/Colonoscopy on 3/8.   3/3: Take last dose of warfarin 3/4: Hold warfarin 3/5: Hold warfarin 3/6: Hold warfarin 3/7: Hold warfarin   3/8: SURGERY; HOLD WARFARIN   3/9: Take 2 tablets warfarin (10 mg) 3/10: Take 2 1/2 tablets warfarin (12.5 mg) 3/11: Take 2 tablets warfarin (10 mg) 3/12: Take 2 1/2 tablets warfarin (12.5 mg) 3/13: Take 2 tablets warfarin (10 mg) 3/14: Recheck INR; no warfarin until after recheck

## 2022-07-24 NOTE — Telephone Encounter (Signed)
Contacted pt and advised of warfarin hold schedule for her surgery. Advised a mychart msg would be sent and if she had any questions to contact the coumadin clinic. RS INR check for 3/14. Pt verbalized understanding.    Sent my chart msg with dosing instructions.

## 2022-07-24 NOTE — Telephone Encounter (Signed)
Contacted pt and advised of warfarin hold schedule for her surgery. Advised a mychart msg would be sent and if she had any questions to contact the coumadin clinic. RS INR check for 3/14. Pt verbalized understanding.   Sent my chart msg with dosing instructions.

## 2022-07-24 NOTE — Telephone Encounter (Signed)
Thank Karsten Fells for your help

## 2022-07-24 NOTE — Telephone Encounter (Signed)
Warfarin hold schedule for EGD/Colonoscopy on 3/8.  3/3: Take last dose of warfarin 3/4: Hold warfarin 3/5: Hold warfarin 3/6: Hold warfarin 3/7: Hold warfarin  3/8: SURGERY; HOLD WARFARIN  3/9: Take 2 tablets warfarin (10 mg) 3/10: Take 2 1/2 tablets warfarin (12.5 mg) 3/11: Take 2 tablets warfarin (10 mg) 3/12: Take 2 1/2 tablets warfarin (12.5 mg) 3/13: Take 2 tablets warfarin (10 mg) 3/14: Recheck INR; no warfarin until after recheck

## 2022-07-24 NOTE — Telephone Encounter (Signed)
Forwarding msg to CMA in GI.

## 2022-07-24 NOTE — Telephone Encounter (Signed)
Noted and agree. Thank you for your diligent work for this patient!

## 2022-07-31 ENCOUNTER — Ambulatory Visit: Payer: BC Managed Care – PPO

## 2022-08-01 ENCOUNTER — Telehealth: Payer: Self-pay | Admitting: *Deleted

## 2022-08-01 ENCOUNTER — Ambulatory Visit (AMBULATORY_SURGERY_CENTER): Payer: BC Managed Care – PPO | Admitting: Gastroenterology

## 2022-08-01 ENCOUNTER — Encounter: Payer: Self-pay | Admitting: Gastroenterology

## 2022-08-01 VITALS — BP 140/75 | HR 60 | Temp 96.8°F | Resp 19 | Ht 65.0 in | Wt 214.0 lb

## 2022-08-01 DIAGNOSIS — Z09 Encounter for follow-up examination after completed treatment for conditions other than malignant neoplasm: Secondary | ICD-10-CM

## 2022-08-01 DIAGNOSIS — K2950 Unspecified chronic gastritis without bleeding: Secondary | ICD-10-CM

## 2022-08-01 DIAGNOSIS — K209 Esophagitis, unspecified without bleeding: Secondary | ICD-10-CM

## 2022-08-01 DIAGNOSIS — D509 Iron deficiency anemia, unspecified: Secondary | ICD-10-CM

## 2022-08-01 DIAGNOSIS — Z86010 Personal history of colonic polyps: Secondary | ICD-10-CM

## 2022-08-01 DIAGNOSIS — B9681 Helicobacter pylori [H. pylori] as the cause of diseases classified elsewhere: Secondary | ICD-10-CM

## 2022-08-01 DIAGNOSIS — K5790 Diverticulosis of intestine, part unspecified, without perforation or abscess without bleeding: Secondary | ICD-10-CM

## 2022-08-01 MED ORDER — SODIUM CHLORIDE 0.9 % IV SOLN: 500.0000 mL | Freq: Once | INTRAVENOUS | Status: DC

## 2022-08-01 MED ORDER — PANTOPRAZOLE SODIUM 40 MG PO TBEC: 40.0000 mg | DELAYED_RELEASE_TABLET | Freq: Two times a day (BID) | ORAL | 0 refills | Status: DC

## 2022-08-01 NOTE — Progress Notes (Signed)
Referring Provider: Pleas Koch, NP Primary Care Physician:  Pleas Koch, NP  Indication for EGD and colonoscopy: Iron deficiency anemia   IMPRESSION:  Iron deficiency anemia Appropriate candidate for monitored anesthesia care  PLAN: EGD and colonoscopy in the [place] today   HPI: Theresa Lewis is a 62 y.o. female presents for endoscopic evaluation of iron deficiency anemia.  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    11/2014 barium swallow moderate hiatal hernia with moderate GERD, no stricture.  10/28/2018 colonoscopy due to rectal bleeding, recall colonoscopy 3 years    06/06/2022 Hgb 11, MCV 74, iron 34, ferritin 6.5 referred for hematology She got OTC iron medications yesterday.  No family history of GI cancer, father with pancreatic and lung cancer.    Patient has a BM every day, she is on miralax daily and once a week adds a colace  Patient denies change in bowel habits, constipation, diarrhea.  Very rare hematochezia with straining but with current medications has not seen for a long time.  Patient reports GERD, was on omeprazole but stopped due to forgetting to take it. Now will take tums as needed for red sauce, does not eat after 8 pm.  She had previous dysphagia, barium 2016 showed hiatal hernia and GERD, she states she does not have dysphagia but chews her food very well.  Patient denies nausea, vomiting, melena.  Denies changes in appetite, unintentional weight loss.  Denies smoking, no ETOH, no NSAIDS.  Has been off her coumadin for recent bladder surgeries without issues, Dion Body, NP prescribes it.       Past Medical History:  Diagnosis Date    ABNORMAL VAGINAL BLEEDING 04/19/2007   Qualifier: Diagnosis of  By: Hulan Saas, CMA (AAMA), Larene Beach S    Acute cystitis without hematuria 01/31/2020   Acute extremity pain 11/04/2019   Acute pelvic pain, female 12/04/2021   Anemia    h/o as a child   Cancer ([place])    Complication of anesthesia    stays asleep for a "very long time"   Deep venous thrombosis ([place])    2006    3 x clotting    GERD (gastroesophageal reflux disease)    HEMORRHOIDS, WITH BLEEDING 12/26/2008   Qualifier: Diagnosis of  By: Regis Bill MD, Standley Brooking    PE (pulmonary embolism) 07/09/2010   Pre-diabetes    Pulmonary embolism ([place])     Past Surgical History:  Procedure Laterality Date   MYOMECTOMY     rectal tear     TRANSURETHRAL RESECTION OF BLADDER TUMOR N/A 04/24/2020   Procedure: TRANSURETHRAL RESECTION OF BLADDER TUMOR (TURBT);  Surgeon: Abbie Sons, MD;  Location: [facility] ORS;  Service: Urology;  Laterality: N/A;   TRANSURETHRAL RESECTION OF BLADDER TUMOR N/A 05/15/2020   Procedure: TRANSURETHRAL RESECTION OF BLADDER TUMOR (TURBT);  Surgeon: Abbie Sons, MD;  Location: [facility] ORS;  Service: Urology;  Laterality: N/A;   TRANSURETHRAL RESECTION OF BLADDER TUMOR N/A 12/04/2020   Procedure: TRANSURETHRAL RESECTION OF BLADDER TUMOR (TURBT);  Surgeon: Abbie Sons, MD;  Location: [facility] ORS;  Service: Urology;  Laterality: N/A;   TUBAL LIGATION     vena caval filter      Current Outpatient Medications  Medication Sig Dispense Refill   amitriptyline (  ELAVIL) 50 MG tablet Take 1 tablet (50 mg total) by mouth at bedtime. 30 tablet 3   cyclobenzaprine (FLEXERIL) 10 MG tablet Take 1 tablet (10 mg total) by mouth 3 (three) times daily as needed for muscle spasms. 30 tablet 0   doxycycline (VIBRAMYCIN) 100 MG capsule Take 1 capsule (100 mg total) by mouth every 12 (twelve) hours. 14 capsule 0   estradiol (ESTRACE VAGINAL) 0.1 MG/GM vaginal cream Place 1 Applicatorful vaginally at bedtime. 1/3 of applicator every  other night x 1 month. Then 2 times a week (Patient not taking: Reported on 07/11/2022) 42.5 g 12   fluconazole (DIFLUCAN) 150 MG tablet Take 1 tablet (150 mg total) by mouth daily. 2 tablet 0   gabapentin (NEURONTIN) 100 MG capsule Take 2 capsules (200 mg total) by mouth 3 (three) times daily. For pain 180 capsule 0   gabapentin (NEURONTIN) 300 MG capsule Take 1 capsule (300 mg total) by mouth 3 (three) times daily. For pain. 270 capsule 0   hydrOXYzine (ATARAX) 50 MG tablet Take one tablet (50 mg total) by mouth at bedtime 30 tablet 3   hyoscyamine (LEVSIN) 0.125 MG tablet Take 1 tablet (0.125 mg total) by mouth every 4 (four) hours as needed. (Patient not taking: Reported on 07/11/2022) 30 tablet 0   Na Sulfate-K Sulfate-Mg Sulf 17.5-3.13-1.6 GM/177ML SOLN 1 colonscopy prep per GI office instructions (Patient not taking: Reported on 07/11/2022) 354 mL 0   oxybutynin (DITROPAN XL) 15 MG 24 hr tablet Take 1 tablet (15 mg total) by mouth daily. 90 tablet 0   polyethylene glycol (MIRALAX / GLYCOLAX) 17 g packet Take 17 g by mouth daily as needed for moderate constipation. 14 each 5   Vibegron (GEMTESA) 75 MG TABS Take 1 tablet (75 mg total) by mouth daily. 42 tablet 0   warfarin (COUMADIN) 5 MG tablet Take 1 and 1/2 tablet by mouth everyday. 135 tablet 0   No current facility-administered medications for this visit.    Allergies as of 08/01/2022 - Review Complete 07/11/2022  Allergen Reaction Noted   Clindamycin/lincomycin  07/21/2011   Tape Other (See Comments) 11/17/2014    Family History  Problem Relation Age of Onset   Hypertension Mother    Rheum arthritis Mother    Lung cancer Father 9       april 12    Pancreatic cancer Father 75   Diabetes Paternal Aunt        x2   Colon cancer Neg Hx    Colon polyps Neg Hx    Kidney disease Neg Hx    Esophageal cancer Neg Hx    Gallbladder disease Neg Hx    Rectal cancer Neg Hx    Stomach cancer Neg Hx    Breast cancer Neg Hx       Physical Exam: General:   Alert,  well-nourished, pleasant and cooperative in NAD Head:  Normocephalic and atraumatic. Eyes:  Sclera clear, no icterus.   Conjunctiva pink. Mouth:  No deformity or lesions.   Neck:  Supple; no masses or thyromegaly. Lungs:  Clear throughout to auscultation.   No wheezes. Heart:  Regular rate and rhythm; no murmurs. Abdomen:  Soft, non-tender, nondistended, normal bowel sounds, no rebound or guarding.  Msk:  Symmetrical. No boney deformities LAD: No inguinal or umbilical LAD Extremities:  No clubbing or edema. Neurologic:  Alert and  oriented x4;  grossly nonfocal Skin:  No obvious rash or bruise. Psych:  Alert and cooperative. Normal mood and  affect.     Studies/Results: No results found.    Shizuye Rupert L. Tarri Glenn, MD, MPH 08/01/2022, 7:28 AM

## 2022-08-01 NOTE — Patient Instructions (Addendum)
Resume previous diet, high fiber diet recommended Resume present medications today, including coumadin Repeat colonoscopy in 5 years Pick up new Rx for Protonix, take 30 min before a meal twice a day for 10 weeks Information given on GERD, esophagitis, Hiatal hernia, high fiber diet, Diverticulosis and hemorrhoids  YOU HAD AN ENDOSCOPIC PROCEDURE TODAY AT [place]:   Refer to the procedure report that was given to you for any specific questions about what was found during the examination.  If the procedure report does not answer your questions, please call your gastroenterologist to clarify.  If you requested that your care partner not be given the details of your procedure findings, then the procedure report has been included in a sealed envelope for you to review at your convenience later.  YOU SHOULD EXPECT: Some feelings of bloating in the abdomen. Passage of more gas than usual.  Walking can help get rid of the air that was put into your GI tract during the procedure and reduce the bloating. If you had a lower endoscopy (such as a colonoscopy or flexible sigmoidoscopy) you may notice spotting of blood in your stool or on the toilet paper. If you underwent a bowel prep for your procedure, you may not have a normal bowel movement for a few days.  Please Note:  You might notice some irritation and congestion in your nose or some drainage.  This is from the oxygen used during your procedure.  There is no need for concern and it should clear up in a day or so.  SYMPTOMS TO REPORT IMMEDIATELY:  Following lower endoscopy (colonoscopy):  Excessive amounts of blood in the stool  Significant tenderness or worsening of abdominal pains  Swelling of the abdomen that is new, acute  Fever of 100F or higher  Following upper endoscopy (EGD)  Vomiting of blood or coffee ground material  New chest pain or pain under the shoulder blades  Painful or persistently difficult swallowing  New  shortness of breath  Black, tarry-looking stools  For urgent or emergent issues, a gastroenterologist can be reached at any hour by calling [phone]. Do not use MyChart messaging for urgent concerns.   DIET:  We do recommend a small meal at first, but then you may proceed to your regular diet.  Drink plenty of fluids but you should avoid alcoholic beverages for 24 hours.  ACTIVITY:  You should plan to take it easy for the rest of today and you should NOT DRIVE or use heavy machinery until tomorrow (because of the sedation medicines used during the test).    FOLLOW UP: Our staff will call the number listed on your records the next business day following your procedure.  We will call around 7:15- 8:00 am to check on you and address any questions or concerns that you may have regarding the information given to you following your procedure. If we do not reach you, we will leave a message.     If any biopsies were taken you will be contacted by phone or by letter within the next 1-3 weeks.  Please call us at [phone] if you have not heard about the biopsies in 3 weeks.   SIGNATURES/CONFIDENTIALITY: You and/or your care partner have signed paperwork which will be entered into your electronic medical record.  These signatures attest to the fact that that the information above on your After Visit Summary has been reviewed and is understood.  Full responsibility of the confidentiality of this  discharge information lies with you and/or your care-partner. 

## 2022-08-01 NOTE — Op Note (Signed)
[facility] Patient Name: Theresa Lewis Procedure Date: 08/01/2022 8:36 AM MRN: [id number] Endoscopist: Thornton Park MD, MD, [id number] Age: 62 Referring MD:  Date of Birth: 1961/01/08 Gender: Female Account #: [id number] Procedure:                Upper GI endoscopy Indications:              Unexplained iron deficiency anemia Medicines:                Monitored Anesthesia Care Procedure:                Pre-Anesthesia Assessment:                           - Prior to the procedure, a History and Physical                            was performed, and patient medications and                            allergies were reviewed. The patient's tolerance of                            previous anesthesia was also reviewed. The risks                            and benefits of the procedure and the sedation                            options and risks were discussed with the patient.                            All questions were answered, and informed consent                            was obtained. Prior Anticoagulants: The patient has                            taken Coumadin (warfarin), last dose was 5 days                            prior to procedure. ASA Grade Assessment: III - A                            patient with severe systemic disease. After                            reviewing the risks and benefits, the patient was                            deemed in satisfactory condition to undergo the                            procedure.  After obtaining informed consent, the endoscope was                            passed under direct vision. Throughout the                            procedure, the patient's blood pressure, pulse, and                            oxygen saturations were monitored continuously. The                            Olympus Scope [id number] was introduced through the                            mouth, and advanced to the second part  of duodenum.                            The upper GI endoscopy was accomplished without                            difficulty. The patient tolerated the procedure                            well. Scope In: Scope Out: Findings:                 LA Grade A (one or more mucosal breaks less than 5                            mm, not extending between tops of 2 mucosal folds)                            esophagitis with no bleeding was found 36 cm from                            the incisors. Biopsies were taken with a cold                            forceps for histology. Estimated blood loss was                            minimal.                           A hiatal hernia was present. No Cameron's lesions                            seen.                           The entire examined stomach was normal. Biopsies                            were taken from the  antrum, body, and fundus with a                            cold forceps for histology. Estimated blood loss                            was minimal.                           The examined duodenum was normal. Biopsies were                            taken with a cold forceps for histology. Estimated                            blood loss was minimal.                           The cardia and gastric fundus were normal on                            retroflexion. Complications:            No immediate complications. Estimated Blood Loss:     Estimated blood loss was minimal. Impression:               - LA Grade A reflux esophagitis with no bleeding.                            Biopsied.                           - Hiatal hernia.                           - Normal stomach. Biopsied.                           - Normal examined duodenum. Biopsied. Recommendation:           - Patient has a contact number available for                            emergencies. The signs and symptoms of potential                            delayed complications were  discussed with the                            patient. Return to normal activities tomorrow.                            Written discharge instructions were provided to the                            patient.                           -  Resume previous diet.                           - Continue present medications.                           - Pantoprazole 40 mg BID x 10 weeks.                           - Await pathology results.                           - Proceed with colonoscopy as planned. Thornton Park MD, MD 08/01/2022 8:54:04 AM This report has been signed electronically.

## 2022-08-01 NOTE — Op Note (Addendum)
[facility] Patient Name: Theresa Lewis Procedure Date: 08/01/2022 8:35 AM MRN: [id number] Endoscopist: Thornton Park MD, MD, [id number] Age: 62 Referring MD:  Date of Birth: 1960/10/04 Gender: Female Account #: [id number] Procedure:                Colonoscopy Indications:              High risk colon cancer surveillance: Personal                            history of multiple (3 or more) adenomas                           4 polyps on colonoscopy in 2020 - tubular adenomas                            and sessile serrated polyps Medicines:                Monitored Anesthesia Care Procedure:                Pre-Anesthesia Assessment:                           - Prior to the procedure, a History and Physical                            was performed, and patient medications and                            allergies were reviewed. The patient's tolerance of                            previous anesthesia was also reviewed. The risks                            and benefits of the procedure and the sedation                            options and risks were discussed with the patient.                            All questions were answered, and informed consent                            was obtained. Prior Anticoagulants: The patient has                            taken Coumadin (warfarin), last dose was 5 days                            prior to procedure. ASA Grade Assessment: III - A                            patient with severe systemic disease. After  reviewing the risks and benefits, the patient was                            deemed in satisfactory condition to undergo the                            procedure.                           After obtaining informed consent, the colonoscope                            was passed under direct vision. Throughout the                            procedure, the patient's blood pressure, pulse, and                             oxygen saturations were monitored continuously. The                            CF [id number] was introduced through the anus                            and advanced to the the cecum, identified by                            appendiceal orifice and ileocecal valve. A second                            forward view of the right colon was performed. The                            colonoscopy was performed with moderate difficulty                            due to a redundant colon and a tortuous colon.                            Successful completion of the procedure was aided by                            changing the patient's position, withdrawing and                            reinserting the scope and applying abdominal                            pressure. The patient tolerated the procedure well.                            The quality of the bowel preparation was excellent.  The terminal ileum, ileocecal valve, appendiceal                            orifice, and rectum were photographed. Scope In: 8:56:49 AM Scope Out: 9:12:38 AM Scope Withdrawal Time: 0 hours 9 minutes 0 seconds  Total Procedure Duration: 0 hours 15 minutes 49 seconds  Findings:                 The perianal and digital rectal examinations were                            normal.                           Non-bleeding internal hemorrhoids were found.                           Multiple medium-mouthed and small-mouthed                            diverticula were found in the entire colon.                           The colon is tortuous and redundant. The exam was                            otherwise without abnormality on direct and                            retroflexion views except for perirectal scarring. Complications:            No immediate complications. Estimated Blood Loss:     Estimated blood loss: none. Impression:               - Non-bleeding internal hemorrhoids.                            - Diverticulosis in the entire examined colon.                           - The examination was otherwise normal on direct                            and retroflexion views.                           - No specimens collected. Recommendation:           - Patient has a contact number available for                            emergencies. The signs and symptoms of potential                            delayed complications were discussed with the  patient. Return to normal activities tomorrow.                            Written discharge instructions were provided to the                            patient.                           - Resume previous diet. High fiber diet recommended.                           - Continue present medications.                           - Resume warfarin today.                           - Repeat colonoscopy in 5 years for surveillance                            given the history of polyps.                           - Emerging evidence supports eating a diet of                            fruits, vegetables, grains, calcium, and yogurt                            while reducing red meat and alcohol may reduce the                            risk of colon cancer.                           - Thank you for allowing me to be involved in your                            colon cancer prevention.                           - I will arrange office follow-up. Thornton Park MD, MD 08/01/2022 9:22:27 AM This report has been signed electronically.

## 2022-08-01 NOTE — Progress Notes (Signed)
Called to room to assist during endoscopic procedure.  Patient ID and intended procedure confirmed with present staff. Received instructions for my participation in the procedure from the performing physician.  

## 2022-08-01 NOTE — Telephone Encounter (Signed)
-----   Message from Thornton Park, MD sent at 08/01/2022  9:15 AM EST ----- Please schedule office follow-up with Estill Bamberg in 2-3 weeks.  Thanks.  KLB

## 2022-08-01 NOTE — Progress Notes (Signed)
Vss nad trans to pacu 

## 2022-08-04 ENCOUNTER — Telehealth: Payer: Self-pay | Admitting: *Deleted

## 2022-08-04 NOTE — Telephone Encounter (Signed)
  Follow up Call-     08/01/2022    7:54 AM  Call back number  Post procedure Call Back phone  # [phone]  Permission to leave phone message Yes     Patient questions:  Do you have a fever, pain , or abdominal swelling? No. Pain Score  0 *  Have you tolerated food without any problems? Yes.    Have you been able to return to your normal activities? Yes.    Do you have any questions about your discharge instructions: Diet   No. Medications  No. Follow up visit  No.  Do you have questions or concerns about your Care? No.  Actions: * If pain score is 4 or above: No action needed, pain <4.

## 2022-08-07 ENCOUNTER — Ambulatory Visit: Payer: BC Managed Care – PPO

## 2022-08-07 DIAGNOSIS — Z7901 Long term (current) use of anticoagulants: Secondary | ICD-10-CM

## 2022-08-07 LAB — POCT INR: INR: 2.2 (ref 2.0–3.0)

## 2022-08-07 NOTE — Patient Instructions (Addendum)
Pre visit review using our clinic review tool, if applicable. No additional management support is needed unless otherwise documented below in the visit note.  Increase dose today to take 2 tablets and then continue 1 1/2 tablets daily except take 2 tablets on Wednesdays. Recheck in 2 weeks.

## 2022-08-07 NOTE — Progress Notes (Addendum)
Pt had EGD/colonoscopy on 3/8 and had a 5 day warfarin hold. Pt had EGD/colonoscopy on 3/8 and had a 5 day warfarin hold. Pt has stopped gabapentin and Gemtisa and has not started amitriptyline or Atarax. Pt will contact urology to inquire is she should continue these medications. Pt has started pantoprazole by GI for GERD/hiatal hernia. This can increase INR so pt will return in 2 weeks for recheck INR. Pt denies any pain currently.   Increase dose today to take 2 tablets and then continue 1 1/2 tablets daily except take 2 tablets on Wednesdays. Recheck in 2 weeks.

## 2022-08-08 LAB — AFB CULTURE WITH SMEAR (NOT AT ARMC)
Acid Fast Culture: NEGATIVE
Acid Fast Smear: NEGATIVE

## 2022-08-10 ENCOUNTER — Telehealth: Payer: Self-pay | Admitting: Urology

## 2022-08-10 NOTE — Telephone Encounter (Signed)
Would you call Mrs. Morell and get her scheduled for her surveillance cystoscopy with Dr. Bernardo Heater?

## 2022-08-11 NOTE — Telephone Encounter (Signed)
Spoke to patient and she is scheduled for Cysto.

## 2022-08-20 ENCOUNTER — Telehealth: Payer: Self-pay | Admitting: *Deleted

## 2022-08-20 ENCOUNTER — Other Ambulatory Visit: Payer: Self-pay | Admitting: *Deleted

## 2022-08-20 MED ORDER — BISMUTH/METRONIDAZ/TETRACYCLIN 140-125-125 MG PO CAPS: 3.0000 | ORAL_CAPSULE | Freq: Four times a day (QID) | ORAL | 0 refills | Status: DC

## 2022-08-20 NOTE — Addendum Note (Signed)
Addended by: Horris Latino on: 08/20/2022 04:48 PM   Modules accepted: Orders

## 2022-08-20 NOTE — Telephone Encounter (Signed)
Pylera sent to pharmacy for 10 days.

## 2022-08-20 NOTE — Telephone Encounter (Signed)
DOD-Dr. Rush Landmark   Per Dr. Tarri Glenn patient has H. Pylori. Tried to send in Pylera as it is preferred by patient's insurance and I'm getting an alert it interacts with patient's Warfarin (see below). Please advise.

## 2022-08-20 NOTE — Telephone Encounter (Addendum)
INR can be closely monitored due to major interaction. Recommend weekly INR checks until stable INRs. Pt will be in coumadin clinic tomorrow morning. Will discuss with pt and PCP.

## 2022-08-20 NOTE — Telephone Encounter (Signed)
Heather, I expect that we may have interactions and INR changes as a result of the use of any medication/antibiotic. My preference would be for her to have close monitoring of her INR with the Coumadin clinic through her [facility], whom she already follows with. If the anticoagulation clinic is willing to see the patient more regularly for INR checks during the course of her Pylera treatment, then I would recommend going ahead and doing that as long as the patient also agrees. If the patient does not agree or the anticoagulation clinic has any issues or further concerns, then I would just wait for Dr. Tarri Glenn to return before trying a different antibiotic regimen. I have placed the patient's PCP as well as the patient's anticoagulation RN on here as well. Thanks. GM

## 2022-08-21 ENCOUNTER — Ambulatory Visit: Payer: BC Managed Care – PPO

## 2022-08-21 DIAGNOSIS — Z7901 Long term (current) use of anticoagulants: Secondary | ICD-10-CM

## 2022-08-21 LAB — POCT INR: INR: 3.7 — AB (ref 2.0–3.0)

## 2022-08-21 NOTE — Progress Notes (Signed)
Pt will start Pylera which has a major interaction with warfarin.  Hold dose today and then change weekly dose to take 1 1/2 tablets daily except take 1 tablets on Wednesdays. Recheck in 1 weeks. Contact office if any signs and symptoms of abnormal bruising or bleeding or go to the ER.

## 2022-08-21 NOTE — Patient Instructions (Addendum)
Pre visit review using our clinic review tool, if applicable. No additional management support is needed unless otherwise documented below in the visit note.  Hold dose today and then change weekly dose to take 1 1/2 tablets daily except take 1 tablets on Wednesdays. Recheck in 1 weeks. Contact office if any signs and symptoms of abnormal bruising or bleeding or go to the ER.

## 2022-08-21 NOTE — Telephone Encounter (Signed)
Noted and discussed with Larene Beach, RN.

## 2022-08-21 NOTE — Telephone Encounter (Signed)
Noted  

## 2022-08-25 NOTE — Telephone Encounter (Signed)
Thanks for the help and the update.  KLB

## 2022-08-28 ENCOUNTER — Ambulatory Visit: Payer: BC Managed Care – PPO

## 2022-08-28 DIAGNOSIS — Z7901 Long term (current) use of anticoagulants: Secondary | ICD-10-CM

## 2022-08-28 LAB — POCT INR: INR: 4.7 — AB (ref 2.0–3.0)

## 2022-08-28 NOTE — Patient Instructions (Addendum)
Pre visit review using our clinic review tool, if applicable. No additional management support is needed unless otherwise documented below in the visit note.  Hold dose today and tomorrow and then take 1 tablet on Saturday and 1 tablet on Sunday, take 1 1/2 tablets on Monday and Tuesday, 1 tablet on Wednesday and recheck INR on Thursday.

## 2022-08-28 NOTE — Progress Notes (Addendum)
Pt started Pylera 6 days ago, which has a major interaction with warfarin. Warfarin dosing was reduced last week to try to cover for interaction. Pt has upcoming cystoscopy on 4/10 for routine observation for bladder cancer. No hold on warfarin is needed for this procedure.  Hold dose today and tomorrow and then take 1 tablet on Saturday and 1 tablet on Sunday, take 1 1/2 tablets on Monday and Tuesday, 1 tablet on Wednesday and recheck INR on Thursday.

## 2022-08-30 ENCOUNTER — Other Ambulatory Visit: Payer: Self-pay | Admitting: Primary Care

## 2022-08-30 DIAGNOSIS — D6869 Other thrombophilia: Secondary | ICD-10-CM

## 2022-08-30 DIAGNOSIS — Z86718 Personal history of other venous thrombosis and embolism: Secondary | ICD-10-CM

## 2022-09-02 ENCOUNTER — Other Ambulatory Visit: Payer: Self-pay | Admitting: Gastroenterology

## 2022-09-02 DIAGNOSIS — K209 Esophagitis, unspecified without bleeding: Secondary | ICD-10-CM

## 2022-09-03 ENCOUNTER — Encounter: Payer: Self-pay | Admitting: Urology

## 2022-09-03 ENCOUNTER — Ambulatory Visit: Payer: BC Managed Care – PPO | Admitting: Urology

## 2022-09-03 VITALS — BP 138/87 | HR 66 | Ht 65.0 in | Wt 214.0 lb

## 2022-09-03 DIAGNOSIS — Z8551 Personal history of malignant neoplasm of bladder: Secondary | ICD-10-CM | POA: Diagnosis not present

## 2022-09-03 DIAGNOSIS — C679 Malignant neoplasm of bladder, unspecified: Secondary | ICD-10-CM

## 2022-09-03 LAB — URINALYSIS, COMPLETE
Bilirubin, UA: NEGATIVE
Glucose, UA: NEGATIVE
Ketones, UA: NEGATIVE
Nitrite, UA: NEGATIVE
RBC, UA: NEGATIVE
Specific Gravity, UA: 1.025 (ref 1.005–1.030)
Urobilinogen, Ur: 0.2 mg/dL (ref 0.2–1.0)
pH, UA: 6 (ref 5.0–7.5)

## 2022-09-03 LAB — MICROSCOPIC EXAMINATION: Epithelial Cells (non renal): >10 /hpf — AB (ref 0–10)

## 2022-09-03 NOTE — Progress Notes (Signed)
   09/03/22  CC:  Chief Complaint  Patient presents with   Cysto   Urological history: 1. T1 high grade urothelial carcinoma bladder TURBT 03/2020 >6 cm papillary bladder tumor Path T1 high-grade urothelial carcinoma Restaging TURBT 04/2020; no residual tumor 6 week induction BCG completed March 2022 Multifocal recurrence cystoscopy 10/31/2020 TURBT 11/2020 Ta high-grade UCa Reinduction BCG completed 02/22/2021 3-week maintenance BCG: 10/2021 Contrast CT (11/2021) - no worrisome findings  Cysto (12/2021) - NED Urine cytology (12/2021) - negative    HPI: After her second maintenance BCG in January 2024 she developed severe frequency, urgency and dysuria and no significant improvement with intravesical lidocaine instillation, Gemtesa, oxycodone, Levsin.  Some response to volume rectal suppository and +/- response to bladder rescue solution.  She states her bladder symptoms have significantly improved over the last month.  UA today 6-10 WBC with >10 epis  Blood pressure (!) 163/93, pulse 69, height 5\' 5"  (1.651 m), weight 214 lb (97.1 kg). NED. A&Ox3.   No respiratory distress   Abd soft, NT, ND Normal external genitalia with patent urethral meatus  Cystoscopy Procedure Note  Patient identification was confirmed, informed consent was obtained, and patient was prepped using Betadine solution.  Lidocaine jelly was administered per urethral meatus.    Procedure: - Flexible cystoscope introduced, without any difficulty.   - Thorough search of the bladder revealed:    normal urethral meatus    normal urothelium    no stones    no ulcers     no tumors    no urethral polyps    no trabeculation  - Ureteral orifices were normal in position and appearance.  Post-Procedure: - Patient tolerated the procedure well  Assessment/ Plan: No evidence of recurrent bladder tumor Surveillance cystoscopy 3 months; if that cystoscopy negative will move to every 6 month surveillance Unclear the  etiology of her severe bladder symptoms after BCG however will discontinue further maintenance BCG    Riki Altes, MD

## 2022-09-04 ENCOUNTER — Ambulatory Visit: Payer: BC Managed Care – PPO

## 2022-09-04 DIAGNOSIS — Z7901 Long term (current) use of anticoagulants: Secondary | ICD-10-CM

## 2022-09-04 LAB — POCT INR: INR: 2 (ref 2.0–3.0)

## 2022-09-04 NOTE — Patient Instructions (Addendum)
Pre visit review using our clinic review tool, if applicable. No additional management support is needed unless otherwise documented below in the visit note.  Increase dose today and tomorrow to take 2 tablets and the change weekly dose to take 1 1/2 tablets daily. Recheck in 2 weeks.

## 2022-09-04 NOTE — Progress Notes (Signed)
Pt reports she has stopped all medication except for PRN gabapentin, Metamucil gummies, and warfarin. All other medications have been stopped by pt. She no longer has any pain and the majority of the medications were for bladder/abdominal pain.  Pt has had a very labile INR over the last 2 months due to BCG treatments causing severe bladder pain. Assessing prior dosing of warfarin pt was stable on 7.5 mg daily so the the weekly dosing was changed to that with 2 days of booster doses.  Increase dose today and tomorrow to take 2 tablets and the change weekly dose to take 1 1/2 tablets daily. Recheck in 2 weeks.

## 2022-09-09 NOTE — Progress Notes (Deleted)
09/09/2022 Theresa Lewis [id number] 1960-06-21  Referring provider: Doreene Nest, NP Primary GI doctor: Dr. Orvan Falconer  ASSESSMENT AND PLAN:   Iron deficiency anemia, unspecified iron deficiency anemia type I recommend upper gastrointestinal and colorectal evaluation with an EGD and colonoscopy.  Risk of bowel prep, conscious sedation, and EGD and colonoscopy were discussed.  Risks include but are not limited to dehydration, pain, bleeding, cardiopulmonary process, bowel perforation, or other possible adverse outcomes..  Treatment plan was discussed with patient, and agreed upon.  History of adenomatous polyp of colon 10/28/2018 colonoscopy due to rectal bleeding, recall colonoscopy 3 years  Antiphospholipid antibody syndrome  --Hold Coumadinx for 5 days before procedure - will instruct when and how to resume after procedure. Patient understands that there is a low but real risk of cardiovascular event such as heart attack, stroke, or embolism /  thrombosis, or ischemia while off Coumadin. The patient consents to proceed. Will communicate by phone or EMR with patient's prescribing provider to confirm that holding Coumadin is reasonable in this case.   Patient Care Team: Doreene Nest, NP as PCP - General (Nurse Practitioner)  HISTORY OF PRESENT ILLNESS: 62 y.o. 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   11/2014 barium swallow moderate hiatal hernia with moderate GERD, no stricture.  10/28/2018 colonoscopy due to rectal bleeding, recall colonoscopy 3 years   08/01/2022 colonoscopy and endoscopy with Dr. Orvan Falconer for IDA Colonoscopy for moderate difficulty due  to redundant and tortuous colon.  Bowel prep excellent.  Nonbleeding internal hemorrhoids, diverticulosis otherwise unremarkable recall 5 years. Endoscopy showed LA grade a reflux esophagitis no bleeding, hiatal hernia, normal stomach normal duodenum, Protonix 40 mg twice daily Pathology showed H. pylori gastritis sent in Pylera for 14 days, needs eradication study.   Denies smoking, no ETOH, no NSAIDS.    She  reports that she has quit smoking. Her smoking use included cigarettes. She has been exposed to tobacco smoke. She has never used smokeless tobacco. She reports that she does not currently use alcohol. She reports that she does not use drugs.  Current Medications:       Current Outpatient Medications (Hematological):    warfarin (COUMADIN) 5 MG tablet, Take 1 and 1/2 tablet by mouth everyday or as directed by Coumadin Clinic.  Current Outpatient Medications (Other):    amitriptyline (ELAVIL) 50 MG tablet, Take 1 tablet (50 mg total) by mouth at bedtime.   Bismuth/Metronidaz/Tetracyclin (PYLERA) 140-125-125 MG CAPS, Take 3 capsules by mouth in the morning, at noon, in the evening, and at bedtime for 10 days.   cyclobenzaprine (FLEXERIL) 10 MG tablet, Take 1 tablet (10 mg total) by mouth 3 (three) times daily as needed for muscle spasms.   estradiol (ESTRACE VAGINAL) 0.1 MG/GM vaginal cream, Place 1 Applicatorful vaginally at bedtime. 1/3 of applicator every other night x 1 month. Then 2 times a week   gabapentin (NEURONTIN) 100 MG capsule, Take 2 capsules (200 mg total) by mouth 3 (three) times daily. For pain   gabapentin (NEURONTIN) 300 MG capsule, Take 1 capsule (300 mg total) by mouth 3 (three) times daily. For pain.   hydrOXYzine (ATARAX) 50 MG tablet, Take one tablet (50 mg total) by mouth at bedtime   hyoscyamine (LEVSIN)  0.125 MG tablet, Take 1 tablet (0.125 mg total) by mouth every 4 (four) hours as needed.   oxybutynin (DITROPAN XL) 15 MG 24 hr tablet, Take 1 tablet (15 mg  total) by mouth daily.   pantoprazole (PROTONIX) 40 MG tablet, TAKE 1 TABLET (40 MG TOTAL) BY MOUTH TWICE A DAY BEFORE MEALS   polyethylene glycol (MIRALAX / GLYCOLAX) 17 g packet, Take 17 g by mouth daily as needed for moderate constipation.   Vibegron (GEMTESA) 75 MG TABS, Take 1 tablet (75 mg total) by mouth daily.  Medical History:  Past Medical History:  Diagnosis Date   ABNORMAL VAGINAL BLEEDING 04/19/2007   Qualifier: Diagnosis of  By: Lawernce Ion, CMA (AAMA), Carollee Herter S    Acute cystitis without hematuria 01/31/2020   Acute extremity pain 11/04/2019   Acute pelvic pain, female 12/04/2021   Anemia    h/o as a child   Cancer    Complication of anesthesia    stays asleep for a "very long time"   Deep venous thrombosis    2006    3 x clotting    GERD (gastroesophageal reflux disease)    HEMORRHOIDS, WITH BLEEDING 12/26/2008   Qualifier: Diagnosis of  By: Fabian Sharp MD, Neta Mends    PE (pulmonary embolism) 07/09/2010   Pre-diabetes    Pulmonary embolism    Allergies:  Allergies  Allergen Reactions   Clindamycin/Lincomycin     "unknown"   Tape Other (See Comments)    When pt has a band-aid on, the impression of band-aid stays on for a month     Surgical History:  She  has a past surgical history that includes Tubal ligation; Myomectomy; vena caval filter; rectal tear; Transurethral resection of bladder tumor (N/A, 04/24/2020); Transurethral resection of bladder tumor (N/A, 05/15/2020); and Transurethral resection of bladder tumor (N/A, 12/04/2020). Family History:  Her family history includes Diabetes in her paternal aunt; Hypertension in her mother; Lung cancer (age of onset: 8) in her father; Pancreatic cancer (age of onset: 43) in her father; Rheum arthritis in her mother.  REVIEW OF SYSTEMS  : All other systems reviewed and negative except where noted in the History of Present Illness.  PHYSICAL EXAM: There were no vitals taken for this visit. General:   Pleasant, well  developed female in no acute distress Head:   Normocephalic and atraumatic. Eyes:  sclerae anicteric,conjunctive pink  Heart:   regular rate and rhythm Pulm:  Clear anteriorly; no wheezing Abdomen:   Soft, Obese AB, Active bowel sounds. No tenderness , No organomegaly appreciated. Rectal: Not evaluated Extremities:  Without edema. Msk: Symmetrical without gross deformities. Peripheral pulses intact.  Neurologic:  Alert and  oriented x4;  No focal deficits.  Skin:   Dry and intact without significant lesions or rashes. Psychiatric:  Cooperative. Normal mood and affect.  RELEVANT LABS AND IMAGING: CBC    Component Value Date/Time   WBC 8.2 07/09/2022 1055   RBC 4.47 07/09/2022 1055   HGB 10.4 (L) 07/09/2022 1055   HCT 34.3 (L) 07/09/2022 1055   PLT 477 (H) 07/09/2022 1055   MCV 76.7 (L) 07/09/2022 1055   MCH 23.3 (L) 07/09/2022 1055   MCHC 30.3 07/09/2022 1055   RDW 19.4 (H) 07/09/2022 1055   LYMPHSABS 1.3 07/09/2022 1055   MONOABS 0.6 07/09/2022 1055   EOSABS 0.0 07/09/2022 1055   BASOSABS 0.0 07/09/2022 1055    CMP     Component Value Date/Time   NA 140 07/09/2022 1055   K 4.5  07/09/2022 1055   CL 108 07/09/2022 1055   CO2 24 07/09/2022 1055   GLUCOSE 114 (H) 07/09/2022 1055   BUN 20 07/09/2022 1055   CREATININE 1.11 (H) 07/09/2022 1055   CALCIUM 9.6 07/09/2022 1055   PROT 8.0 07/09/2022 1055   ALBUMIN 4.0 07/09/2022 1055   AST 56 (H) 07/09/2022 1055   ALT 37 07/09/2022 1055   ALKPHOS 67 07/09/2022 1055   BILITOT 1.3 (H) 07/09/2022 1055   GFRNONAA 56 (L) 07/09/2022 1055   GFRAA >90 07/21/2011 1352     Doree Albee, PA-C 8:29 AM

## 2022-09-13 IMAGING — CT CT ABD-PEL WO/W CM
3 of 12 series · 12 of 46 positions shown, 18 images · IV contrast (omnipaque)
Comparison: None

CLINICAL DATA: Hematuria.  Urgency.

EXAM:
CT ABDOMEN AND PELVIS WITHOUT AND WITH CONTRAST
TECHNIQUE: Multidetector CT imaging of the abdomen and pelvis was performed
following the standard protocol before and following the bolus
administration of intravenous contrast.
CONTRAST:  125mL OMNIPAQUE IOHEXOL 300 MG/ML  SOLN

[Series 9: axial with hematuria with 5.00 · axial · 0.66mm/px · z∈[-1416,-1076]mm · 7 of 92 slices shown, 12 images]
[im 12/92  soft-tissue]
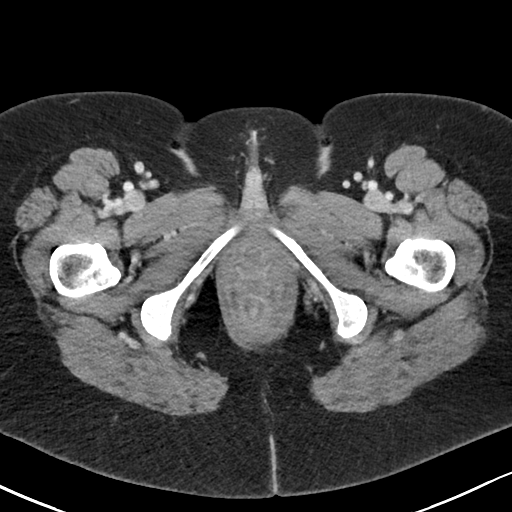
[im 12/92  bone]
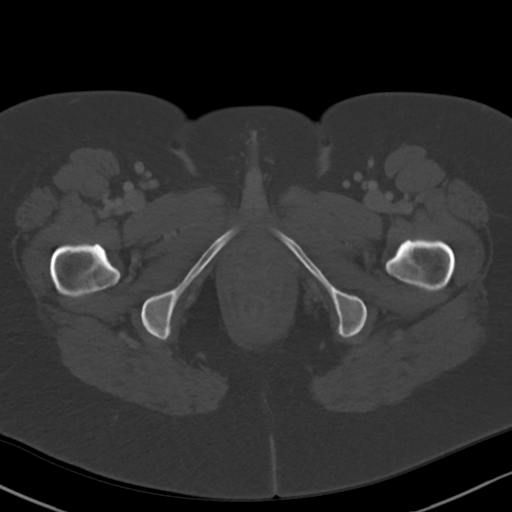
[im 23/92  soft-tissue]
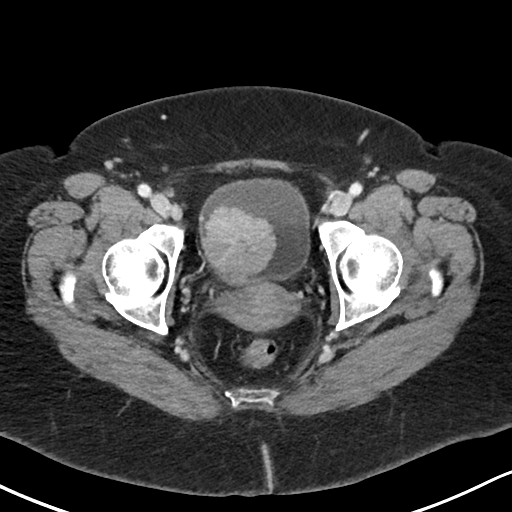
[im 35/92  soft-tissue]
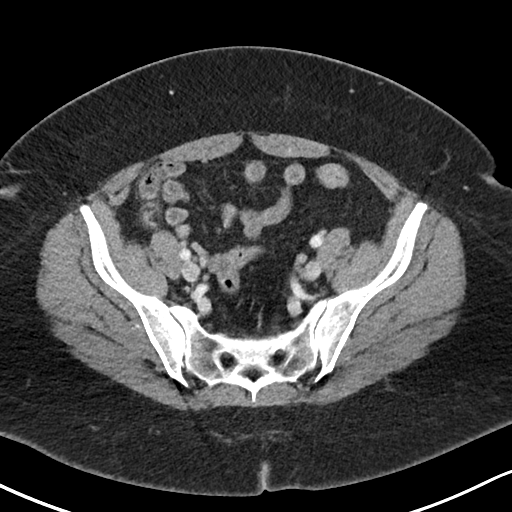
[im 46/92  soft-tissue]
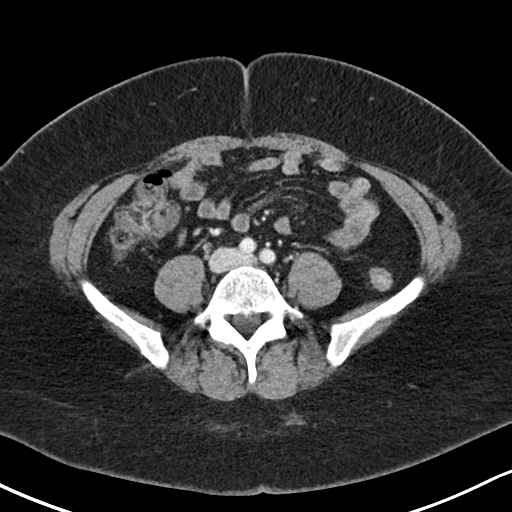
[im 46/92  lung]
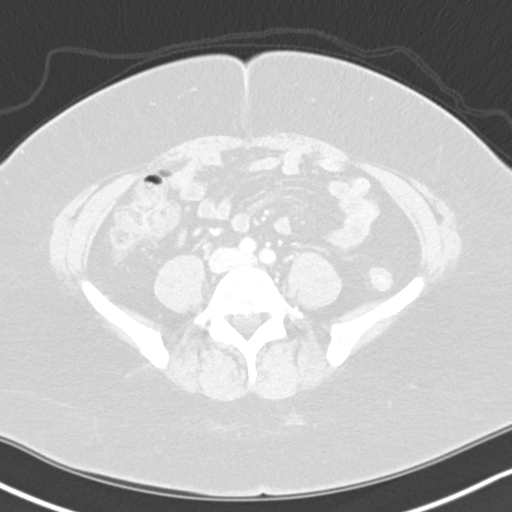
[im 57/92  soft-tissue]
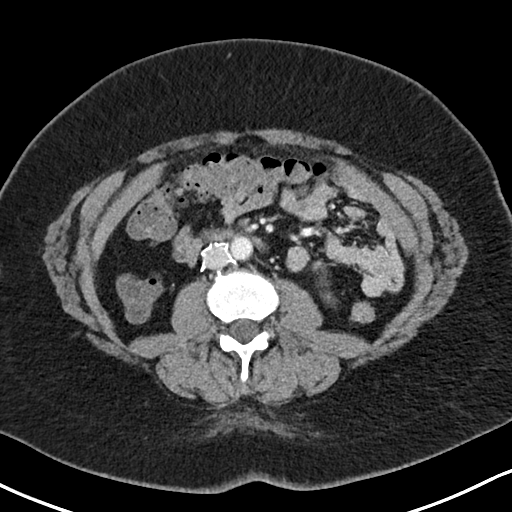
[im 57/92  lung]
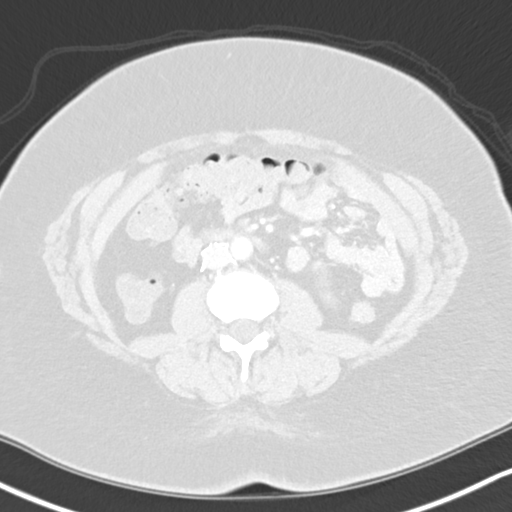
[im 69/92  soft-tissue]
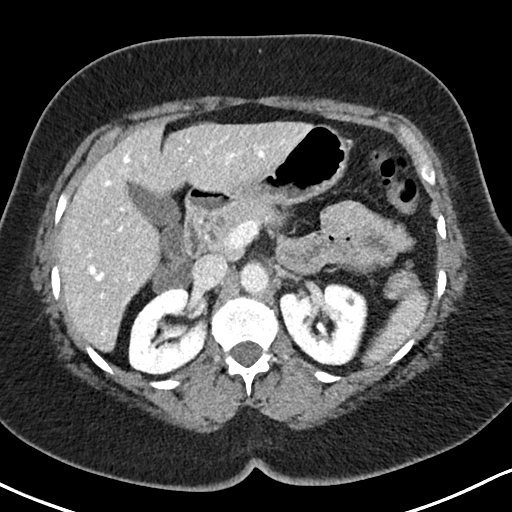
[im 69/92  lung]
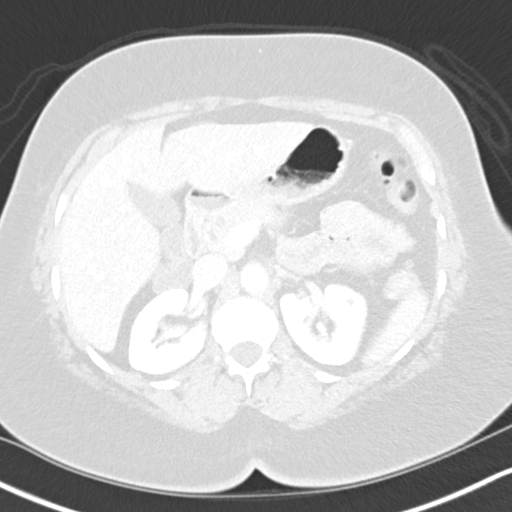
[im 80/92  soft-tissue]
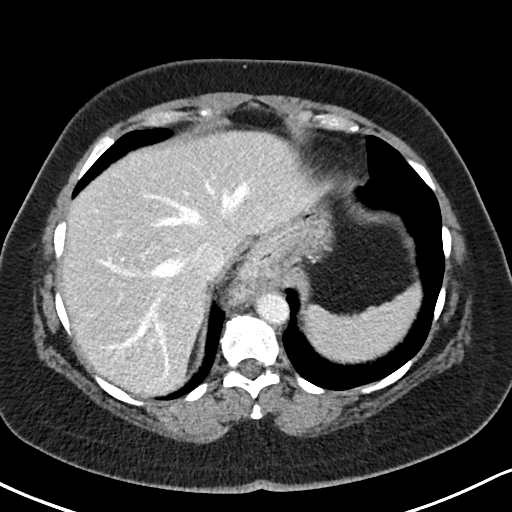
[im 80/92  lung]
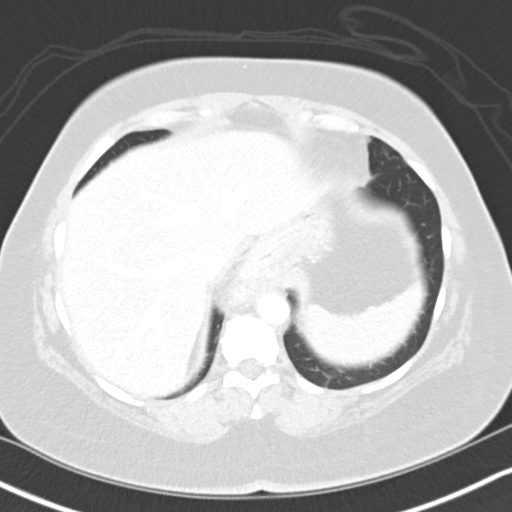

[Series 17: axial delay delay prone 5.00 · axial · delayed · 0.66mm/px · z∈[-1154,-1039]mm · 3 of 92 slices shown]
[im 12/92  soft-tissue]
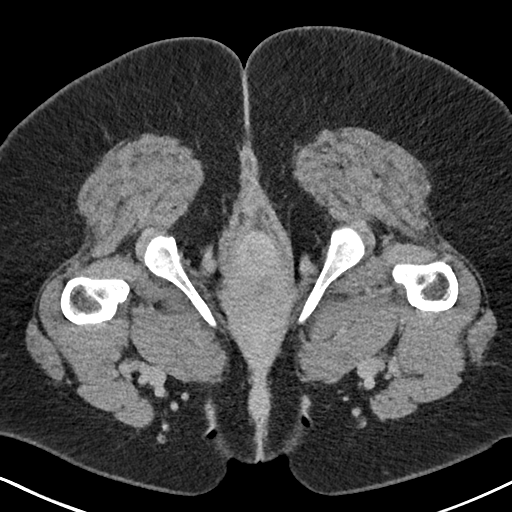
[im 23/92  soft-tissue]
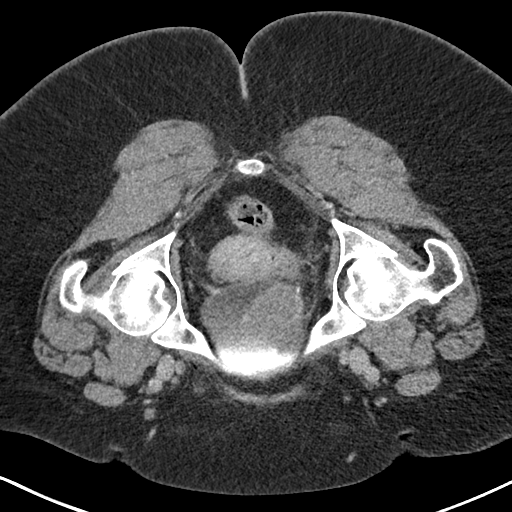
[im 35/92  soft-tissue]
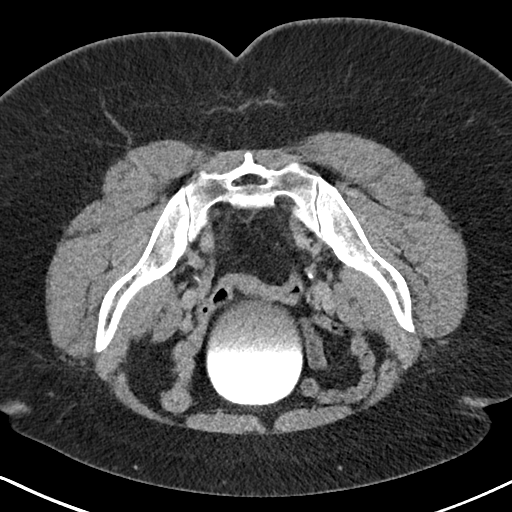

[Series 20: cor delay delay prone 2.00 cor · coronal · delayed · 0.66mm/px · 2 of 165 slices shown, 3 images]
[im 55/165  soft-tissue]
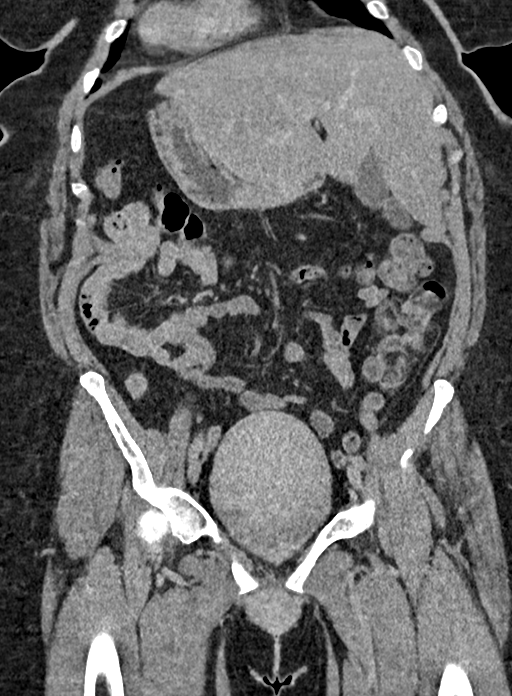
[im 55/165  bone]
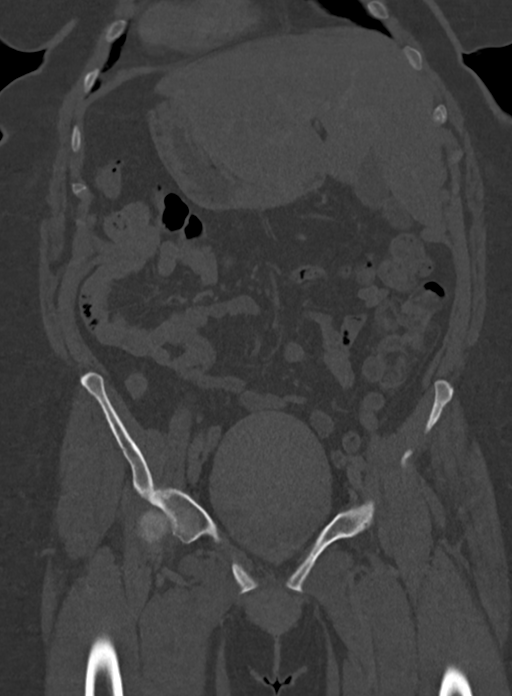
[im 110/165  soft-tissue]
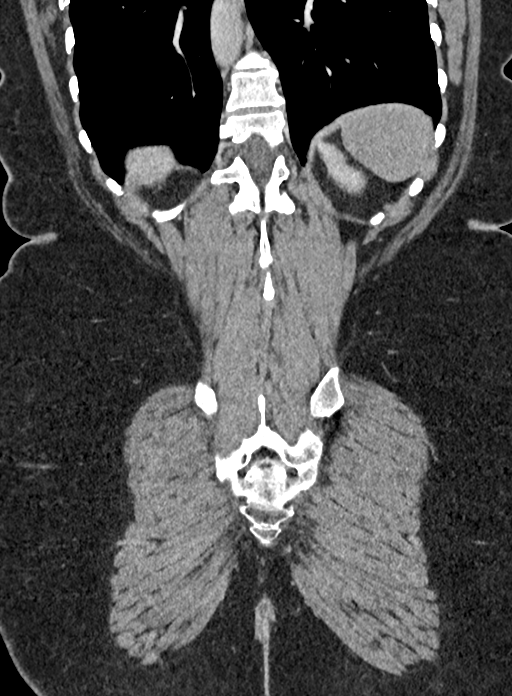

[12 of 46 positions shown; findings below may reference images not displayed]

FINDINGS: Lower chest: No acute abnormality.

Hepatobiliary: No focal liver abnormality is seen. No gallstones,
gallbladder wall thickening, or biliary dilatation.

Pancreas: Unremarkable. No pancreatic ductal dilatation or
surrounding inflammatory changes.

Spleen: Normal in size without focal abnormality.

Adrenals/Urinary Tract: Normal appearance of the adrenal glands. No
kidney stones identified bilaterally. Small cortical hypodensity
within the anterior cortex of the left upper pole measures 5 mm and
is too small to characterize. No suspicious filling defects
identified within the collecting systems or ureters bilaterally.

There is a enhancing mass arising from the posterior and right
lateral wall of the bladder which measures 4.7 x 4.8 by 4.6 cm. This
extends over the right UVJ but does not appear to obstruct the right
ureter.

Stomach/Bowel: Moderate to large hiatal hernia. No bowel wall
thickening, inflammation or distension. The appendix is visualized
and appears within normal limits.

Vascular/Lymphatic: Normal appearance of the abdominal aorta. No
abdominopelvic adenopathy identified. IVC filter noted.

Reproductive: Uterus and bilateral adnexa are unremarkable.

Other: No free fluid or fluid collections.

Musculoskeletal: No acute or significant osseous findings.
IMPRESSION: 1. There is an enhancing mass arising from the posterior and right
lateral wall of the bladder which extends over the right UVJ but
does not appear to obstruct the right ureter. This is concerning for
urothelial neoplasm
2. No findings of nodal metastasis or solid organ metastasis within
the abdomen or pelvis.
3. Hiatal hernia.

## 2022-09-15 ENCOUNTER — Ambulatory Visit: Payer: BC Managed Care – PPO | Admitting: Physician Assistant

## 2022-09-18 ENCOUNTER — Ambulatory Visit: Payer: BC Managed Care – PPO

## 2022-09-18 DIAGNOSIS — Z7901 Long term (current) use of anticoagulants: Secondary | ICD-10-CM | POA: Diagnosis not present

## 2022-09-18 LAB — POCT INR: INR: 2 (ref 2.0–3.0)

## 2022-09-18 NOTE — Progress Notes (Signed)
Increase dose today to take 2 1/2 tablets and increase dose tomorrow to take 2 tablets and the change weekly dose to take 1 1/2 tablets daily except take 2 tablets on Mondays and Thursdays. Recheck in 2 weeks.

## 2022-09-18 NOTE — Patient Instructions (Addendum)
Pre visit review using our clinic review tool, if applicable. No additional management support is needed unless otherwise documented below in the visit note.  Increase dose today to take 2 1/2 tablets and increase dose tomorrow to take 2 tablets and the change weekly dose to take 1 1/2 tablets daily except take 2 tablets on Mondays and Thursdays. Recheck in 2 weeks.

## 2022-09-23 ENCOUNTER — Ambulatory Visit
Admission: RE | Admit: 2022-09-23 | Discharge: 2022-09-23 | Disposition: A | Discharge status: Home / Self Care | Payer: BC Managed Care – PPO | Source: Ambulatory Visit | Attending: Primary Care | Admitting: Primary Care

## 2022-09-23 DIAGNOSIS — Z1231 Encounter for screening mammogram for malignant neoplasm of breast: Secondary | ICD-10-CM | POA: Diagnosis present

## 2022-10-02 ENCOUNTER — Ambulatory Visit: Payer: BC Managed Care – PPO

## 2022-10-02 DIAGNOSIS — Z7901 Long term (current) use of anticoagulants: Secondary | ICD-10-CM

## 2022-10-02 LAB — POCT INR: INR: 3.1 — AB (ref 2.0–3.0)

## 2022-10-02 NOTE — Progress Notes (Addendum)
Pt reports urology advised her she will not have any further BCG treatments. She will continue 6 month cystoscopy monitoring and if anything is found they will need to use a different treatment.  Continue 1 1/2 tablets daily except take 2 tablets on Mondays and Thursdays. Recheck in 4 weeks.

## 2022-10-02 NOTE — Patient Instructions (Addendum)
Pre visit review using our clinic review tool, if applicable. No additional management support is needed unless otherwise documented below in the visit note.  Continue 1 1/2 tablets daily except take 2 tablets on Mondays and Thursdays. Recheck in 4 weeks.

## 2022-10-30 ENCOUNTER — Ambulatory Visit: Payer: BC Managed Care – PPO

## 2022-10-30 ENCOUNTER — Telehealth: Payer: Self-pay

## 2022-10-30 DIAGNOSIS — Z7901 Long term (current) use of anticoagulants: Secondary | ICD-10-CM

## 2022-10-30 LAB — POCT INR: INR: 2 (ref 2.0–3.0)

## 2022-10-30 NOTE — Telephone Encounter (Signed)
Form completed and placed in Kelli's inbox 

## 2022-10-30 NOTE — Progress Notes (Signed)
Pt reports she forgot she was to supposed take 2 tablets on Mondays and Thursdays and 1 1/2 tablets the other days. She has only been taking 2 tablets on Mondays. Due to her taking only 1 tablet on Thursdays she will begin today with 2 tablets today and start taking the correct dosing.   Continue 1 1/2 tablets daily except take 2 tablets on Mondays and Thursdays. Recheck in 2 weeks.

## 2022-10-30 NOTE — Telephone Encounter (Signed)
Patient has been notified.  Form placed up front with reception.

## 2022-10-30 NOTE — Patient Instructions (Addendum)
Pre visit review using our clinic review tool, if applicable. No additional management support is needed unless otherwise documented below in the visit note.  Continue 1 1/2 tablets daily except take 2 tablets on Mondays and Thursdays. Recheck in 2 weeks.

## 2022-10-30 NOTE — Telephone Encounter (Signed)
Pt was in coumadin clinic today and requested a msg be sent to PCP requesting a 6 month extension on her handicap placard. Advised a msg would be sent. She requested the office contact her when it is ready so she can stop in and pick it up.  Routing to PCP's CMA.

## 2022-11-13 ENCOUNTER — Ambulatory Visit: Payer: BC Managed Care – PPO

## 2022-11-13 DIAGNOSIS — Z7901 Long term (current) use of anticoagulants: Secondary | ICD-10-CM | POA: Diagnosis not present

## 2022-11-13 LAB — POCT INR
INR: 3 (ref 2.0–3.0)
INR: 3.3 — AB (ref 2.0–3.0)

## 2022-11-13 NOTE — Progress Notes (Addendum)
Only one POCT INR performed. First result was entered in error. Continue 1 1/2 tablets daily except take 2 tablets on Mondays and Thursdays. Recheck in 4 weeks.

## 2022-11-13 NOTE — Patient Instructions (Addendum)
Pre visit review using our clinic review tool, if applicable. No additional management support is needed unless otherwise documented below in the visit note.  Continue 1 1/2 tablets daily except take 2 tablets on Mondays and Thursdays. Recheck in 4 weeks.  

## 2022-12-03 ENCOUNTER — Ambulatory Visit: Payer: BC Managed Care – PPO | Admitting: Urology

## 2022-12-03 ENCOUNTER — Encounter: Payer: Self-pay | Admitting: Urology

## 2022-12-03 VITALS — BP 129/72 | HR 45 | Ht 65.0 in | Wt 214.0 lb

## 2022-12-03 DIAGNOSIS — Z8551 Personal history of malignant neoplasm of bladder: Secondary | ICD-10-CM | POA: Diagnosis not present

## 2022-12-03 DIAGNOSIS — C679 Malignant neoplasm of bladder, unspecified: Secondary | ICD-10-CM

## 2022-12-03 LAB — URINALYSIS, COMPLETE
Bilirubin, UA: NEGATIVE
Glucose, UA: NEGATIVE
Ketones, UA: NEGATIVE
Leukocytes,UA: NEGATIVE
Nitrite, UA: NEGATIVE
Protein,UA: NEGATIVE
RBC, UA: NEGATIVE
Specific Gravity, UA: 1.025 (ref 1.005–1.030)
Urobilinogen, Ur: 0.2 mg/dL (ref 0.2–1.0)
pH, UA: 5.5 (ref 5.0–7.5)

## 2022-12-03 LAB — MICROSCOPIC EXAMINATION: Epithelial Cells (non renal): >10 /hpf — AB (ref 0–10)

## 2022-12-03 NOTE — Progress Notes (Signed)
   12/03/22  CC:  Chief Complaint  Patient presents with   Cysto   Urological history: 1. T1 high grade urothelial carcinoma bladder TURBT 03/2020 >6 cm papillary bladder tumor Path T1 high-grade urothelial carcinoma Restaging TURBT 04/2020; no residual tumor 6 week induction BCG completed March 2022 Multifocal recurrence cystoscopy 10/31/2020 TURBT 11/2020 Ta high-grade UCa Reinduction BCG completed 02/22/2021 3-week maintenance BCG: 10/2021 Contrast CT (11/2021) - no worrisome findings  Cysto (12/2021) - NED Urine cytology (12/2021) - negative    HPI: No complaints, who presents for 31-month surveillance cystoscopy.  Denies gross hematuria.  UA today without WBC/RBC  Blood pressure (!) 163/93, pulse 69, height 5\' 5"  (1.651 m), weight 214 lb (97.1 kg). NED. A&Ox3.   No respiratory distress   Abd soft, NT, ND Normal external genitalia with patent urethral meatus  Cystoscopy Procedure Note  Patient identification was confirmed, informed consent was obtained, and patient was prepped using Betadine solution.  Lidocaine jelly was administered per urethral meatus.    Procedure: - Flexible cystoscope introduced, without any difficulty.   - Thorough search of the bladder revealed:    normal urethral meatus    normal urothelium    no stones    no ulcers     no tumors    no urethral polyps    no trabeculation  - Ureteral orifices were normal in position and appearance.  Post-Procedure: - Patient tolerated the procedure well  Assessment/ Plan: No evidence of recurrent bladder tumor 2 years out since last recurrence and will remove to every 6 month surveillance cystoscopy   Riki Altes, MD

## 2022-12-11 ENCOUNTER — Ambulatory Visit: Payer: BC Managed Care – PPO

## 2022-12-11 DIAGNOSIS — Z7901 Long term (current) use of anticoagulants: Secondary | ICD-10-CM

## 2022-12-11 LAB — POCT INR: INR: 2.8 (ref 2.0–3.0)

## 2022-12-11 NOTE — Progress Notes (Addendum)
Pt reports she had a cystoscopy last week and was told she is cancer free currently. It has been 2 years since last recurrence of bladder cancer. Her urologist has moved her cystoscopy monitoring to Q21months.   Continue 1 1/2 tablets daily except take 2 tablets on Mondays and Thursdays. Recheck in 4 weeks.

## 2022-12-11 NOTE — Patient Instructions (Addendum)
Pre visit review using our clinic review tool, if applicable. No additional management support is needed unless otherwise documented below in the visit note.  Continue 1 1/2 tablets daily except take 2 tablets on Mondays and Thursdays. Recheck in 4 weeks.

## 2022-12-24 ENCOUNTER — Encounter (INDEPENDENT_AMBULATORY_CARE_PROVIDER_SITE_OTHER): Payer: Self-pay

## 2022-12-26 ENCOUNTER — Other Ambulatory Visit: Payer: Self-pay | Admitting: Primary Care

## 2022-12-26 DIAGNOSIS — D6869 Other thrombophilia: Secondary | ICD-10-CM

## 2022-12-26 DIAGNOSIS — Z86718 Personal history of other venous thrombosis and embolism: Secondary | ICD-10-CM

## 2023-01-08 ENCOUNTER — Ambulatory Visit: Payer: BC Managed Care – PPO

## 2023-01-08 DIAGNOSIS — Z7901 Long term (current) use of anticoagulants: Secondary | ICD-10-CM | POA: Diagnosis not present

## 2023-01-08 LAB — POCT INR: INR: 6 — AB (ref 2.0–3.0)

## 2023-01-08 NOTE — Patient Instructions (Addendum)
Pre visit review using our clinic review tool, if applicable. No additional management support is needed unless otherwise documented below in the visit note.  Hold warfarin for today, tomorrow and the next day.  9/18: Take 1 tablet 9/19: Take 1 1/2 tablets 9/20: Take 1 tablet 9/21: Take 1 1/2 tablets 9/22: Recheck INR; do not take warfarin until after INR check

## 2023-01-08 NOTE — Progress Notes (Addendum)
Pt started taking biotin and collagen. Research does not show any interaction with warfarin with these supplements. Pt has not started Prevagen. Pt would like a suggestion for a medication, OTC or script, that would help her memory and increase speed of recall. Will inquire with PCP if anything is advised to help this. Pt also reports she has been told by work that she has to return to work at least a few days a week. 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 She reports being extremely stressed due to these changes. She is not sure how she will handle having to go back to work.  Mental stress has elevated INR with this pt in the past. Pt denies any other changes.  Hold warfarin for today, tomorrow and the next day.  9/18: Take 1 tablet 9/19: Take 1 1/2 tablets 9/20: Take 1 tablet 9/21: Take 1 1/2 tablets 9/22: Recheck INR; do not take warfarin until after INR check  Pt denies any s/s of abnormal bruising or bleeding. Advised if any s/s to go to the ER. Pt verbalized understanding.

## 2023-01-15 ENCOUNTER — Ambulatory Visit: Payer: BC Managed Care – PPO

## 2023-01-15 DIAGNOSIS — Z7901 Long term (current) use of anticoagulants: Secondary | ICD-10-CM

## 2023-01-15 LAB — POCT INR: INR: 1.8 — AB (ref 2.0–3.0)

## 2023-01-15 NOTE — Progress Notes (Signed)
INR last week was 6.0. Pt was advised to hold warfarin for 3 days and the she was given a lower dose for the next 3 days. Her INR was elevated due to extreme anxiety concerning returning to the office for her job. She has now returned for 2 days a week and reports everything is going well and she is no longer stressed. Pt was stable on current dosing for several months so no long term weekly change will be made to her dosing. Increase dose today to take 2 1/2 tablets and increase dose tomorrow to take 2 tablets and then continue 1 1/2 tablets daily except take 2 tablets on Monday and Thursday. Recheck in 3 weeks.

## 2023-01-15 NOTE — Patient Instructions (Addendum)
Pre visit review using our clinic review tool, if applicable. No additional management support is needed unless otherwise documented below in the visit note.  Increase dose today to take 2 1/2 tablets and increase dose tomorrow to take 2 tablets and then continue 1 1/2 tablets daily except take 2 tablets on Monday and Thursday. Recheck in 3 weeks.

## 2023-02-05 ENCOUNTER — Ambulatory Visit: Payer: BC Managed Care – PPO

## 2023-02-05 DIAGNOSIS — Z7901 Long term (current) use of anticoagulants: Secondary | ICD-10-CM | POA: Diagnosis not present

## 2023-02-05 LAB — POCT INR: INR: 2.6 (ref 2.0–3.0)

## 2023-02-05 NOTE — Progress Notes (Signed)
Continue 1 1/2 tablets daily except take 2 tablets on Monday and Thursday. Recheck in 4 weeks.

## 2023-02-05 NOTE — Patient Instructions (Addendum)
Pre visit review using our clinic review tool, if applicable. No additional management support is needed unless otherwise documented below in the visit note.  Continue 1 1/2 tablets daily except take 2 tablets on Monday and Thursday. Recheck in 4 weeks.

## 2023-02-19 ENCOUNTER — Ambulatory Visit: Payer: BC Managed Care – PPO | Admitting: Primary Care

## 2023-02-19 VITALS — BP 128/76 | HR 55 | Temp 97.5°F | Ht 65.0 in | Wt 210.0 lb

## 2023-02-19 DIAGNOSIS — K219 Gastro-esophageal reflux disease without esophagitis: Secondary | ICD-10-CM | POA: Diagnosis not present

## 2023-02-19 DIAGNOSIS — R1013 Epigastric pain: Secondary | ICD-10-CM

## 2023-02-19 LAB — CBC WITH DIFFERENTIAL/PLATELET
Basophils Absolute: 0.1 10*3/uL (ref 0.0–0.1)
Basophils Relative: 1.3 % (ref 0.0–3.0)
Eosinophils Absolute: 0.3 10*3/uL (ref 0.0–0.7)
Eosinophils Relative: 7.7 % — ABNORMAL HIGH (ref 0.0–5.0)
HCT: 37.6 % (ref 36.0–46.0)
Hemoglobin: 11.3 g/dL — ABNORMAL LOW (ref 12.0–15.0)
Lymphocytes Relative: 34.3 % (ref 12.0–46.0)
Lymphs Abs: 1.5 10*3/uL (ref 0.7–4.0)
MCHC: 30 g/dL (ref 30.0–36.0)
MCV: 79 fl (ref 78.0–100.0)
Monocytes Absolute: 0.3 10*3/uL (ref 0.1–1.0)
Monocytes Relative: 6.3 % (ref 3.0–12.0)
Neutro Abs: 2.2 10*3/uL (ref 1.4–7.7)
Neutrophils Relative %: 50.4 % (ref 43.0–77.0)
Platelets: 309 10*3/uL (ref 150.0–400.0)
RBC: 4.76 Mil/uL (ref 3.87–5.11)
RDW: 19.1 % — ABNORMAL HIGH (ref 11.5–15.5)
WBC: 4.3 10*3/uL (ref 4.0–10.5)

## 2023-02-19 LAB — COMPREHENSIVE METABOLIC PANEL
ALT: 40 U/L — ABNORMAL HIGH (ref 0–35)
AST: 20 U/L (ref 0–37)
Albumin: 3.7 g/dL (ref 3.5–5.2)
Alkaline Phosphatase: 99 U/L (ref 39–117)
BUN: 10 mg/dL (ref 6–23)
CO2: 29 mEq/L (ref 19–32)
Calcium: 9.2 mg/dL (ref 8.4–10.5)
Chloride: 108 mEq/L (ref 96–112)
Creatinine, Ser: 0.96 mg/dL (ref 0.40–1.20)
GFR: 63.04 mL/min (ref >60.00)
Glucose, Bld: 101 mg/dL — ABNORMAL HIGH (ref 70–99)
Potassium: 3.9 mEq/L (ref 3.5–5.1)
Sodium: 141 mEq/L (ref 135–145)
Total Bilirubin: 0.5 mg/dL (ref 0.2–1.2)
Total Protein: 7 g/dL (ref 6.0–8.3)

## 2023-02-19 LAB — LIPASE: Lipase: 41 U/L (ref 11.0–59.0)

## 2023-02-19 MED ORDER — OMEPRAZOLE 20 MG PO CPDR: 20.0000 mg | DELAYED_RELEASE_CAPSULE | Freq: Every day | ORAL | 3 refills | Status: DC

## 2023-02-19 NOTE — Assessment & Plan Note (Signed)
Differentials include GERD, hiatal hernia, gastroparesis.  Lower suspicion for acute pancreatitis or gallbladder involvement.  Checking labs today including CMP, CBC with differential, lipase.  Resume omeprazole 20 mg daily before meal.  Continue to avoid trigger foods, laying flat within 2 hours after eating. She will update if symptoms return.  At that point she may need GI evaluation/repeat imaging.

## 2023-02-19 NOTE — Patient Instructions (Signed)
Stop by the lab prior to leaving today. I will notify you of your results once received.   Resume omeprazole 20 mg daily before a meal.   Please update me if your pain returns.  It was a pleasure to see you today!

## 2023-02-19 NOTE — Progress Notes (Signed)
Subjective:    Patient ID: Theresa Lewis, female    DOB: 06-17-1960, 62 y.o.   MRN: [id number]  Abdominal Pain Associated symptoms include constipation. Pertinent negatives include no diarrhea, fever, nausea or vomiting.    Theresa Lewis is a very pleasant 62 y.o. female with a history of bleeding internal hemorrhoids, GERD, dysphagia, urothelial carcinoma of the bladder, antiphospholipid antibody syndrome who presents today to discuss abdominal pain.  Her first episode occurred around 3 am 6 days ago while sleeping. She felt a sudden onset of epigastric abdominal pain with radiation to the bilateral upper abdomen with radiation around to her bilateral back. She describes her pain as cramping. About 1 hour later, she felt nauseated, vomited, and her pain was instantly relieved. Prior to vomiting she took Tylenol and applied heat which did not help.   Her second episode occurred three mornings ago, around 3 am, woke her from sleep, pain was in the same location. She gagged herself to facilitate vomiting and her pain resided. She went back to bed. For lunch that day she ate at [employer], ate fried food.   She eats breakfast and lunch (lunch is largest meal) and a snack for dinner. Prior to the first episode for lunch she had lemon pepper wings and french fries. Prior to the second episode at lunch she ate fried food at [facility].   Her last CT abdomen/pelvis was completed in February 2024 which was without evidence of bladder cancer recurrence or metastases.  There was note of potential acute cystitis, moderate-sized hiatal hernia.  She stopped taking her omeprazole in early 2024 as she's not experienced heartburn symptoms except with certain trigger foods. She does not eat after 8 pm and does not lay down within 2 hours from eating.   She denies fevers, chills, diarrhea, feeling sick. She has noticed constipation over the last 2 days.    Review of Systems  Constitutional:   Negative for chills, fatigue and fever.  Gastrointestinal:  Positive for abdominal pain and constipation. Negative for diarrhea, nausea and vomiting.         Past Medical History:  Diagnosis Date   ABNORMAL VAGINAL BLEEDING 04/19/2007   Qualifier: Diagnosis of  By: Lawernce Ion, CMA (AAMA), Carollee Herter S    Acute cystitis without hematuria 01/31/2020   Acute extremity pain 11/04/2019   Acute pelvic pain, female 12/04/2021   Anemia    h/o as a child   Cancer (HCC)    Complication of anesthesia    stays asleep for a "very long time"   Deep venous thrombosis (HCC)    2006    3 x clotting    GERD (gastroesophageal reflux disease)    HEMORRHOIDS, WITH BLEEDING 12/26/2008   Qualifier: Diagnosis of  By: Fabian Sharp MD, Neta Mends    PE (pulmonary embolism) 07/09/2010   Pre-diabetes    Pulmonary embolism (HCC)     Social History   Socioeconomic History   Marital status: Married    Spouse name: Not on file   Number of children: 1   Years of education: Not on file   Highest education level: Associate degree: occupational, Scientist, product/process development, or vocational program  Occupational History   Occupation: student advocate   Tobacco Use   Smoking status: Former    Types: Cigarettes    Passive exposure: Past   Smokeless tobacco: Never  Vaping Use   Vaping status: Never Used  Substance and Sexual Activity   Alcohol use: Not Currently  Comment: rarely   Drug use: No   Sexual activity: Not on file  Other Topics Concern   Not on file  Social History Narrative   HH of 3  Daughter 30 and self.  hh of 2    No pets.   No ets.   No etoh.    Working for [employer] and [employer] .  45- 50 hours per week.                Social Determinants of Health   Financial Resource Strain: Low Risk  (02/19/2023)   Overall Financial Resource Strain (CARDIA)    Difficulty of Paying Living Expenses: Not hard at all  Food Insecurity: No Food Insecurity (02/19/2023)   Hunger Vital Sign    Worried About Running Out of Food  in the Last Year: Never true    Ran Out of Food in the Last Year: Never true  Transportation Needs: No Transportation Needs (02/19/2023)   PRAPARE - Administrator, Civil Service (Medical): No    Lack of Transportation (Non-Medical): No  Physical Activity: Unknown (02/19/2023)   Exercise Vital Sign    Days of Exercise per Week: 0 days    Minutes of Exercise per Session: Not on file  Stress: No Stress Concern Present (02/19/2023)   [employer] of Occupational Health - Occupational Stress Questionnaire    Feeling of Stress : Not at all  Social Connections: Moderately Integrated (02/19/2023)   Social Connection and Isolation Panel [NHANES]    Frequency of Communication with Friends and Family: Once a week    Frequency of Social Gatherings with Friends and Family: Once a week    Attends Religious Services: More than 4 times per year    Active Member of [employer] or Organizations: Yes    Attends Engineer, structural: More than 4 times per year    Marital Status: Married  Catering manager Violence: Not At Risk (07/09/2022)   Humiliation, Afraid, Rape, and Kick questionnaire    Fear of Current or Ex-Partner: No    Emotionally Abused: No    Physically Abused: No    Sexually Abused: No    Past Surgical History:  Procedure Laterality Date   MYOMECTOMY     rectal tear     TRANSURETHRAL RESECTION OF BLADDER TUMOR N/A 04/24/2020   Procedure: TRANSURETHRAL RESECTION OF BLADDER TUMOR (TURBT);  Surgeon: Riki Altes, MD;  Location: [facility];  Service: Urology;  Laterality: N/A;   TRANSURETHRAL RESECTION OF BLADDER TUMOR N/A 05/15/2020   Procedure: TRANSURETHRAL RESECTION OF BLADDER TUMOR (TURBT);  Surgeon: Riki Altes, MD;  Location: [facility];  Service: Urology;  Laterality: N/A;   TRANSURETHRAL RESECTION OF BLADDER TUMOR N/A 12/04/2020   Procedure: TRANSURETHRAL RESECTION OF BLADDER TUMOR (TURBT);  Surgeon: Riki Altes, MD;  Location: [facility];  Service: Urology;   Laterality: N/A;   TUBAL LIGATION     vena caval filter      Family History  Problem Relation Age of Onset   Hypertension Mother    Rheum arthritis Mother    Lung cancer Father 13       april 12    Pancreatic cancer Father 89   Diabetes Paternal Aunt        x2   Colon cancer Neg Hx    Colon polyps Neg Hx    Kidney disease Neg Hx    Esophageal cancer Neg Hx    Gallbladder disease Neg Hx  Rectal cancer Neg Hx    Stomach cancer Neg Hx    Breast cancer Neg Hx     Allergies  Allergen Reactions   Clindamycin/Lincomycin     "unknown"   Tape Other (See Comments)    When pt has a band-aid on, the impression of band-aid stays on for a month    Current Outpatient Medications on File Prior to Visit  Medication Sig Dispense Refill   gabapentin (NEURONTIN) 100 MG capsule Take 2 capsules (200 mg total) by mouth 3 (three) times daily. For pain 180 capsule 0   gabapentin (NEURONTIN) 300 MG capsule Take 1 capsule (300 mg total) by mouth 3 (three) times daily. For pain. 270 capsule 0   polyethylene glycol (MIRALAX / GLYCOLAX) 17 g packet Take 17 g by mouth daily as needed for moderate constipation. 14 each 5   warfarin (COUMADIN) 5 MG tablet Take 1 and 1/2 tablet by mouth everyday, except take 2 tablets on Mondays and Thursdays or as directed by [facility]. 150 tablet 1   No current facility-administered medications on file prior to visit.    BP 128/76   Pulse (!) 55   Temp (!) 97.5 F (36.4 C) (Temporal)   Ht 5\' 5"  (1.651 m)   Wt 210 lb (95.3 kg)   SpO2 98%   BMI 34.95 kg/m  Objective:   Physical Exam Cardiovascular:     Rate and Rhythm: Normal rate and regular rhythm.  Pulmonary:     Effort: Pulmonary effort is normal.  Abdominal:     General: Bowel sounds are normal.     Palpations: Abdomen is soft.     Tenderness: There is no abdominal tenderness.  Musculoskeletal:     Cervical back: Neck supple.  Skin:    General: Skin is warm and dry.            Assessment & Plan:  Epigastric abdominal pain Assessment & Plan: Differentials include GERD, hiatal hernia, gastroparesis.  Lower suspicion for acute pancreatitis or gallbladder involvement.  Checking labs today including CMP, CBC with differential, lipase.  Resume omeprazole 20 mg daily before meal.  Continue to avoid trigger foods, laying flat within 2 hours after eating. She will update if symptoms return.  At that point she may need GI evaluation/repeat imaging.  Orders: -     CBC with Differential/Platelet -     Comprehensive metabolic panel -     Lipase  Gastroesophageal reflux disease, unspecified whether esophagitis present -     Omeprazole; Take 1 capsule (20 mg total) by mouth daily. Before a meal for heartburn  Dispense: 90 capsule; Refill: 3        Doreene Nest, NP

## 2023-03-05 ENCOUNTER — Ambulatory Visit: Payer: BC Managed Care – PPO

## 2023-03-05 DIAGNOSIS — Z7901 Long term (current) use of anticoagulants: Secondary | ICD-10-CM

## 2023-03-05 LAB — POCT INR: INR: 5 — AB (ref 2.0–3.0)

## 2023-03-05 NOTE — Patient Instructions (Addendum)
Pre visit review using our clinic review tool, if applicable. No additional management support is needed unless otherwise documented below in the visit note.  Hold dose today and then reduce dose tomorrow to take 1/2 tablet and then change weekly dose to take 1 1/2 tablets daily. Recheck in 2 weeks.

## 2023-03-05 NOTE — Progress Notes (Signed)
Pt started taking daily omeprazole again. This interacts with warfarin. Pt denies any other changes. Pt denies any s/s of bleeding or abnormal bruising.  Advised if any s/s to go to ER. Pt verbalized understanding.  Hold dose today and then reduce dose tomorrow to take 1/2 tablet and then change weekly dose to take 1 1/2 tablets daily. Recheck in 2 weeks.

## 2023-03-19 ENCOUNTER — Ambulatory Visit: Payer: BC Managed Care – PPO

## 2023-03-19 DIAGNOSIS — Z7901 Long term (current) use of anticoagulants: Secondary | ICD-10-CM | POA: Diagnosis not present

## 2023-03-19 LAB — POCT INR: INR: 3 (ref 2.0–3.0)

## 2023-03-19 NOTE — Progress Notes (Signed)
Continue 1 1/2 tablets daily. Recheck in 2 weeks per pt request due to her going out of town.

## 2023-03-19 NOTE — Patient Instructions (Addendum)
Pre visit review using our clinic review tool, if applicable. No additional management support is needed unless otherwise documented below in the visit note.  Continue 1 1/2 tablets daily. Recheck in 2 weeks.

## 2023-03-26 ENCOUNTER — Ambulatory Visit: Payer: BC Managed Care – PPO

## 2023-04-01 ENCOUNTER — Ambulatory Visit: Payer: BC Managed Care – PPO

## 2023-04-01 DIAGNOSIS — Z7901 Long term (current) use of anticoagulants: Secondary | ICD-10-CM

## 2023-04-01 LAB — POCT INR: INR: 4.7 — AB (ref 2.0–3.0)

## 2023-04-01 NOTE — Progress Notes (Signed)
Hold dose today and then reduce dose tomorrow to take 1 tablet and then change weekly dose to take 1 1/2 tablets daily except take 1 tablet on Monday and Thursday. Recheck in 2 weeks.

## 2023-04-01 NOTE — Patient Instructions (Addendum)
Pre visit review using our clinic review tool, if applicable. No additional management support is needed unless otherwise documented below in the visit note.  Hold dose today and then reduce dose tomorrow to take 1 tablet and then change weekly dose to take 1 1/2 tablets daily except take 1 tablet on Monday and Thursday. Recheck in 2 weeks.

## 2023-04-16 ENCOUNTER — Ambulatory Visit: Payer: BC Managed Care – PPO | Admitting: Primary Care

## 2023-04-16 ENCOUNTER — Ambulatory Visit: Payer: BC Managed Care – PPO

## 2023-04-16 VITALS — BP 130/74 | HR 50 | Temp 97.3°F | Ht 65.0 in | Wt 214.0 lb

## 2023-04-16 DIAGNOSIS — M25541 Pain in joints of right hand: Secondary | ICD-10-CM

## 2023-04-16 DIAGNOSIS — M25542 Pain in joints of left hand: Secondary | ICD-10-CM | POA: Diagnosis not present

## 2023-04-16 DIAGNOSIS — G8929 Other chronic pain: Secondary | ICD-10-CM | POA: Diagnosis not present

## 2023-04-16 DIAGNOSIS — Z7901 Long term (current) use of anticoagulants: Secondary | ICD-10-CM | POA: Diagnosis not present

## 2023-04-16 DIAGNOSIS — Z23 Encounter for immunization: Secondary | ICD-10-CM

## 2023-04-16 LAB — POCT INR: INR: 2.3 (ref 2.0–3.0)

## 2023-04-16 LAB — SEDIMENTATION RATE: Sed Rate: 28 mm/h (ref 0–30)

## 2023-04-16 LAB — C-REACTIVE PROTEIN: CRP: <1 mg/dL (ref 0.5–20.0)

## 2023-04-16 MED ORDER — DICLOFENAC SODIUM 1 % EX GEL: 2.0000 g | Freq: Three times a day (TID) | CUTANEOUS | 1 refills | Status: AC | PRN

## 2023-04-16 NOTE — Progress Notes (Signed)
Subjective:    Patient ID: Theresa Lewis, female    DOB: Jan 08, 1961, 62 y.o.   MRN: [id number]  Hand Pain     Theresa Lewis is a very pleasant 62 y.o. female with a history of trigeminal neuralgia, prediabetes, chronic knee pain, urothelial carcinoma of the bladder, antiphospholipid antibody syndrome who presents today to discuss hand pain.  Her hand pain is located to her bilateral hands to the bilateral DIP joints, 2nd digit of left hand, 2nd and 3rd digits of right hands. Also with swelling and nodules to those joints.   Her pain and symptoms originally began >1 year ago. She types all day for work, has been doing so for 35 years. Her mother has a history of the same to her hands. She's unsure of a family history of rheumatoid arthritis.    She denies joint aches to the hips, back, ankles, PIP joints of her hands.    Review of Systems  Musculoskeletal:  Positive for arthralgias and joint swelling.  Skin:  Negative for color change.         Past Medical History:  Diagnosis Date   ABNORMAL VAGINAL BLEEDING 04/19/2007   Qualifier: Diagnosis of  By: Lawernce Ion, CMA (AAMA), Carollee Herter S    Acute cystitis without hematuria 01/31/2020   Acute extremity pain 11/04/2019   Acute pelvic pain, female 12/04/2021   Anemia    h/o as a child   Cancer (HCC)    Complication of anesthesia    stays asleep for a "very long time"   Deep venous thrombosis (HCC)    2006    3 x clotting    GERD (gastroesophageal reflux disease)    HEMORRHOIDS, WITH BLEEDING 12/26/2008   Qualifier: Diagnosis of  By: Fabian Sharp MD, Neta Mends    PE (pulmonary embolism) 07/09/2010   Pre-diabetes    Pulmonary embolism (HCC)     Social History   Socioeconomic History   Marital status: Married    Spouse name: Not on file   Number of children: 1   Years of education: Not on file   Highest education level: Associate degree: occupational, Scientist, product/process development, or vocational program  Occupational History   Occupation:  student advocate   Tobacco Use   Smoking status: Former    Types: Cigarettes    Passive exposure: Past   Smokeless tobacco: Never  Vaping Use   Vaping status: Never Used  Substance and Sexual Activity   Alcohol use: Not Currently    Comment: rarely   Drug use: No   Sexual activity: Not on file  Other Topics Concern   Not on file  Social History Narrative   HH of 3  Daughter 6 and self.  hh of 2    No pets.   No ets.   No etoh.    Working for [employer] and [employer] .  45- 50 hours per week.                Social Determinants of Health   Financial Resource Strain: Low Risk  (04/16/2023)   Overall Financial Resource Strain (CARDIA)    Difficulty of Paying Living Expenses: Not hard at all  Food Insecurity: No Food Insecurity (04/16/2023)   Hunger Vital Sign    Worried About Running Out of Food in the Last Year: Never true    Ran Out of Food in the Last Year: Never true  Transportation Needs: No Transportation Needs (04/16/2023)   PRAPARE - Transportation  Lack of Transportation (Medical): No    Lack of Transportation (Non-Medical): No  Physical Activity: Unknown (04/16/2023)   Exercise Vital Sign    Days of Exercise per Week: 0 days    Minutes of Exercise per Session: Not on file  Stress: No Stress Concern Present (04/16/2023)   [employer] of Occupational Health - Occupational Stress Questionnaire    Feeling of Stress : Only a little  Social Connections: Socially Integrated (04/16/2023)   Social Connection and Isolation Panel [NHANES]    Frequency of Communication with Friends and Family: Three times a week    Frequency of Social Gatherings with Friends and Family: Once a week    Attends Religious Services: More than 4 times per year    Active Member of [employer] or Organizations: Yes    Attends Engineer, structural: More than 4 times per year    Marital Status: Married  Catering manager Violence: Not At Risk (07/09/2022)   Humiliation, Afraid, Rape, and  Kick questionnaire    Fear of Current or Ex-Partner: No    Emotionally Abused: No    Physically Abused: No    Sexually Abused: No    Past Surgical History:  Procedure Laterality Date   MYOMECTOMY     rectal tear     TRANSURETHRAL RESECTION OF BLADDER TUMOR N/A 04/24/2020   Procedure: TRANSURETHRAL RESECTION OF BLADDER TUMOR (TURBT);  Surgeon: Riki Altes, MD;  Location: [facility];  Service: Urology;  Laterality: N/A;   TRANSURETHRAL RESECTION OF BLADDER TUMOR N/A 05/15/2020   Procedure: TRANSURETHRAL RESECTION OF BLADDER TUMOR (TURBT);  Surgeon: Riki Altes, MD;  Location: [facility];  Service: Urology;  Laterality: N/A;   TRANSURETHRAL RESECTION OF BLADDER TUMOR N/A 12/04/2020   Procedure: TRANSURETHRAL RESECTION OF BLADDER TUMOR (TURBT);  Surgeon: Riki Altes, MD;  Location: [facility];  Service: Urology;  Laterality: N/A;   TUBAL LIGATION     vena caval filter      Family History  Problem Relation Age of Onset   Hypertension Mother    Rheum arthritis Mother    Lung cancer Father 32       april 12    Pancreatic cancer Father 60   Diabetes Paternal Aunt        x2   Colon cancer Neg Hx    Colon polyps Neg Hx    Kidney disease Neg Hx    Esophageal cancer Neg Hx    Gallbladder disease Neg Hx    Rectal cancer Neg Hx    Stomach cancer Neg Hx    Breast cancer Neg Hx     Allergies  Allergen Reactions   Clindamycin/Lincomycin     "unknown"   Tape Other (See Comments)    When pt has a band-aid on, the impression of band-aid stays on for a month    Current Outpatient Medications on File Prior to Visit  Medication Sig Dispense Refill   gabapentin (NEURONTIN) 100 MG capsule Take 2 capsules (200 mg total) by mouth 3 (three) times daily. For pain 180 capsule 0   gabapentin (NEURONTIN) 300 MG capsule Take 1 capsule (300 mg total) by mouth 3 (three) times daily. For pain. 270 capsule 0   omeprazole (PRILOSEC) 20 MG capsule Take 1 capsule (20 mg total) by mouth daily.  Before a meal for heartburn 90 capsule 3   polyethylene glycol (MIRALAX / GLYCOLAX) 17 g packet Take 17 g by mouth daily as needed for moderate constipation. 14 each 5  warfarin (COUMADIN) 5 MG tablet Take 1 and 1/2 tablet by mouth everyday, except take 2 tablets on Mondays and Thursdays or as directed by [facility]madin Clinic. 150 tablet 1   No current facility-administered medications on file prior to visit.    BP 130/74   Pulse (!) 50   Temp (!) 97.3 F (36.3 C) (Temporal)   Ht 5\' 5"  (1.651 m)   Wt 214 lb (97.1 kg)   SpO2 98%   BMI 35.61 kg/m  Objective:   Physical Exam Cardiovascular:     Rate and Rhythm: Normal rate.  Pulmonary:     Effort: Pulmonary effort is normal.  Musculoskeletal:     Right hand: Normal range of motion.     Left hand: Normal range of motion.     Comments: Heberden's nodes noted to bilateral hands at DIP joints, most prominent to left second DIP joint and right second and third DIP joints.  Mild curvature of bilateral fifth digits at DIP joints.  Mild swelling noted to left and right second digits at DIP joints.  Skin:    General: Skin is warm and dry.     Findings: No erythema.           Assessment & Plan:  Chronic hand pain, unspecified laterality Assessment & Plan: Differentials include osteoarthritis.  Will rule out rheumatoid arthritis.  Checking labs today including CCP, RF, sed rate, CRP.  Discussed that the ultimate treatment for further progression was rest.  We discussed interventions such as using voice activated typing. Prescription for Voltaren gel sent to pharmacy.  Await results.  Orders: -     Diclofenac Sodium; Apply 2 g topically 3 (three) times daily as needed.  Dispense: 100 g; Refill: 1 -     Rheumatoid factor -     C-reactive protein -     Cyclic citrul peptide antibody, IgG -     Sedimentation rate  Encounter for immunization -     Flu vaccine trivalent PF, 6mos and  older(Flulaval,Afluria,Fluarix,Fluzone)        Doreene Nest, NP

## 2023-04-16 NOTE — Assessment & Plan Note (Signed)
Differentials include osteoarthritis.  Will rule out rheumatoid arthritis.  Checking labs today including CCP, RF, sed rate, CRP.  Discussed that the ultimate treatment for further progression was rest.  We discussed interventions such as using voice activated typing. Prescription for Voltaren gel sent to pharmacy.  Await results.

## 2023-04-16 NOTE — Progress Notes (Signed)
Pt also has PCP apt today. Pt is not taking prilosec consistently.  Increase dose today to 1 1/2 tablets and the continue 1 1/2 tablets daily except take 1 tablet on Monday and Thursday. Recheck in 3 weeks.

## 2023-04-16 NOTE — Patient Instructions (Signed)
Stop by the lab prior to leaving today. I will notify you of your results once received.   You may apply the diclofenac (Voltaren) gel to your fingers as needed for pain and swelling.  It was a pleasure to see you today!

## 2023-04-16 NOTE — Patient Instructions (Addendum)
Pre visit review using our clinic review tool, if applicable. No additional management support is needed unless otherwise documented below in the visit note.  Increase dose today to 1 1/2 tablets and the continue 1 1/2 tablets daily except take 1 tablet on Monday and Thursday. Recheck in 3 weeks.

## 2023-04-18 LAB — CYCLIC CITRUL PEPTIDE ANTIBODY, IGG: Cyclic Citrullin Peptide Ab: <16 U

## 2023-04-18 LAB — RHEUMATOID FACTOR: Rheumatoid fact SerPl-aCnc: <10 [IU]/mL (ref <14)

## 2023-05-07 ENCOUNTER — Ambulatory Visit: Payer: BC Managed Care – PPO

## 2023-05-07 DIAGNOSIS — Z7901 Long term (current) use of anticoagulants: Secondary | ICD-10-CM

## 2023-05-07 LAB — POCT INR: INR: 3.2 — AB (ref 2.0–3.0)

## 2023-05-07 NOTE — Patient Instructions (Addendum)
Pre visit review using our clinic review tool, if applicable. No additional management support is needed unless otherwise documented below in the visit note.  Continue 1 1/2 tablets daily except take 1 tablet on Monday and Thursday. Recheck in 4 weeks.

## 2023-05-07 NOTE — Progress Notes (Signed)
Continue 1 1/2 tablets daily except take 1 tablet on Monday and Thursday. Recheck in 4 weeks.

## 2023-06-04 ENCOUNTER — Ambulatory Visit (INDEPENDENT_AMBULATORY_CARE_PROVIDER_SITE_OTHER): Payer: 59

## 2023-06-04 DIAGNOSIS — Z7901 Long term (current) use of anticoagulants: Secondary | ICD-10-CM

## 2023-06-04 LAB — POCT INR: INR: 3.5 — AB (ref 2.0–3.0)

## 2023-06-04 NOTE — Progress Notes (Signed)
 Continue 1 1/2 tablets daily except take 1 tablet on Monday and Thursday. Recheck in 4 weeks.

## 2023-06-04 NOTE — Patient Instructions (Addendum)
 Pre visit review using our clinic review tool, if applicable. No additional management support is needed unless otherwise documented below in the visit note.  Continue 1 1/2 tablets daily except take 1 tablet on Monday and Thursday. Recheck in 4 weeks.

## 2023-06-08 ENCOUNTER — Other Ambulatory Visit: Payer: BC Managed Care – PPO | Admitting: Urology

## 2023-06-17 ENCOUNTER — Ambulatory Visit: Payer: BC Managed Care – PPO | Admitting: Urology

## 2023-06-17 ENCOUNTER — Encounter: Payer: Self-pay | Admitting: Urology

## 2023-06-17 VITALS — BP 142/89 | HR 61 | Ht 65.0 in | Wt <= 1120 oz

## 2023-06-17 DIAGNOSIS — Z8551 Personal history of malignant neoplasm of bladder: Secondary | ICD-10-CM | POA: Diagnosis not present

## 2023-06-17 NOTE — Progress Notes (Signed)
   06/17/23  CC:  Chief Complaint  Patient presents with   Procedure   Cysto   Urological history: 1. T1 high grade urothelial carcinoma bladder TURBT 03/2020 >6 cm papillary bladder tumor Path T1 high-grade urothelial carcinoma Restaging TURBT 04/2020; no residual tumor 6 week induction BCG completed March 2022 Multifocal recurrence cystoscopy 10/31/2020 TURBT 11/2020 Ta high-grade UCa Reinduction BCG completed 02/22/2021 3-week maintenance BCG: 10/2021 Contrast CT (11/2021) - no worrisome findings     HPI: No complaints.  This is her first 65-month follow-up surveillance cystoscopy.  She was unable to give a urine today however has had no bothersome lower urinary tract symptoms  Blood pressure 142/89, pulse 61, height 5\' 5"  (1.651 m) NED. A&Ox3.   No respiratory distress   Abd soft, NT, ND Normal external genitalia with patent urethral meatus  Cystoscopy Procedure Note  Patient identification was confirmed, informed consent was obtained, and patient was prepped using Betadine solution.  Lidocaine jelly was administered per urethral meatus.    Procedure: - Flexible cystoscope introduced, without any difficulty.   - Thorough search of the bladder revealed:    normal urethral meatus    normal urothelium    no stones    no ulcers     no tumors    no urethral polyps    no trabeculation  - Ureteral orifices were normal in position and appearance.  Post-Procedure: - Patient tolerated the procedure well  Assessment/ Plan: No evidence of recurrent bladder tumor Follow-up surveillance cystoscopy 6 months   Riki Altes, MD

## 2023-07-02 ENCOUNTER — Ambulatory Visit: Payer: 59

## 2023-07-02 DIAGNOSIS — Z7901 Long term (current) use of anticoagulants: Secondary | ICD-10-CM

## 2023-07-02 LAB — POCT INR: INR: 1.9 — AB (ref 2.0–3.0)

## 2023-07-02 NOTE — Patient Instructions (Addendum)
 Pre visit review using our clinic review tool, if applicable. No additional management support is needed unless otherwise documented below in the visit note.  Increase dose today to take 1 1/2 tablets and increase dose tomorrow to take 2 tablets and then continue 1 1/2 tablets daily except take 1 tablet on Monday and Thursday. Recheck in 2 weeks.

## 2023-07-02 NOTE — Progress Notes (Signed)
 Pt has been eating cashews as a snack for the last couple of weeks. Cashews do have an average amount of vitamin K and eaten in large quantities can decrease INR. Increase dose today to take 1 1/2 tablets and increase dose tomorrow to take 2 tablets and then continue 1 1/2 tablets daily except take 1 tablet on Monday and Thursday. Recheck in 2 weeks.

## 2023-07-16 ENCOUNTER — Ambulatory Visit: Payer: 59

## 2023-07-23 ENCOUNTER — Ambulatory Visit: Payer: 59

## 2023-07-23 DIAGNOSIS — Z7901 Long term (current) use of anticoagulants: Secondary | ICD-10-CM | POA: Diagnosis not present

## 2023-07-23 LAB — POCT INR: INR: 2.5 (ref 2.0–3.0)

## 2023-07-23 NOTE — Patient Instructions (Addendum)
 Pre visit review using our clinic review tool, if applicable. No additional management support is needed unless otherwise documented below in the visit note.  Continue 1 1/2 tablets daily except take 1 tablet on Monday and Thursday. Recheck in 4 weeks.

## 2023-07-23 NOTE — Progress Notes (Signed)
 Continue 1 1/2 tablets daily except take 1 tablet on Monday and Thursday. Recheck in 4 weeks.

## 2023-08-20 ENCOUNTER — Ambulatory Visit: Payer: 59

## 2023-08-20 DIAGNOSIS — Z7901 Long term (current) use of anticoagulants: Secondary | ICD-10-CM

## 2023-08-20 LAB — POCT INR: INR: 4.1 — AB (ref 2.0–3.0)

## 2023-08-20 NOTE — Patient Instructions (Addendum)
 Pre visit review using our clinic review tool, if applicable. No additional management support is needed unless otherwise documented below in the visit note.  Hold dose today and then change weekly 1 1/2 tablets daily except take 1 tablet on Monday, Wednesday and Friday. Recheck in 2 weeks.

## 2023-08-20 NOTE — Progress Notes (Signed)
 Pt reports she has been taking omeprazole intermittently in the past but has recently been taking it more. This does interact with warfarin and will increase INR. Advised to keep taking this medication consistently if she does need it. Pt has also not been eating any vitamin K containing foods. Educated pt to add these back to diet but keep them consistent in her diet. Pt has also had a lot of stress at work lately. Stress has increased pt's INR in the past. Pt verbalized understanding.  Hold dose today and then change weekly 1 1/2 tablets daily except take 1 tablet on Monday, Wednesday and Friday. Recheck in 2 weeks.

## 2023-08-28 ENCOUNTER — Other Ambulatory Visit: Payer: Self-pay | Admitting: Primary Care

## 2023-08-28 DIAGNOSIS — D6869 Other thrombophilia: Secondary | ICD-10-CM

## 2023-08-28 DIAGNOSIS — Z86718 Personal history of other venous thrombosis and embolism: Secondary | ICD-10-CM

## 2023-08-28 NOTE — Telephone Encounter (Signed)
Patient is due for CPE/follow up.  Please schedule, thank you!

## 2023-08-28 NOTE — Telephone Encounter (Signed)
 Called  pt and schedule a appt for cpe

## 2023-09-03 ENCOUNTER — Ambulatory Visit

## 2023-09-03 DIAGNOSIS — Z7901 Long term (current) use of anticoagulants: Secondary | ICD-10-CM

## 2023-09-03 LAB — POCT INR: INR: 2.2 (ref 2.0–3.0)

## 2023-09-03 NOTE — Patient Instructions (Addendum)
 Pre visit review using our clinic review tool, if applicable. No additional management support is needed unless otherwise documented below in the visit note.  Increase dose today to take 2 tablets and then continue 1 1/2 tablets daily except take 1 tablet on Monday, Wednesday and Friday. Recheck in 3 weeks.

## 2023-09-03 NOTE — Progress Notes (Signed)
 Pt reports 2 days ago she could not remember if she took her warfarin so instead of taking the 1 1/2 tablets prescribed, she took 1 tablet. This would explain why her INR is subtherapeutic today. Increase dose today to take 2 tablets and then continue 1 1/2 tablets daily except take 1 tablet on Monday, Wednesday and Friday. Recheck in 3 weeks.

## 2023-09-16 ENCOUNTER — Encounter: Payer: Self-pay | Admitting: Primary Care

## 2023-09-16 ENCOUNTER — Other Ambulatory Visit: Payer: Self-pay | Admitting: Primary Care

## 2023-09-16 ENCOUNTER — Ambulatory Visit (INDEPENDENT_AMBULATORY_CARE_PROVIDER_SITE_OTHER): Admitting: Primary Care

## 2023-09-16 VITALS — BP 132/84 | HR 59 | Temp 97.7°F | Ht 65.0 in | Wt 213.0 lb

## 2023-09-16 DIAGNOSIS — Z0000 Encounter for general adult medical examination without abnormal findings: Secondary | ICD-10-CM | POA: Diagnosis not present

## 2023-09-16 DIAGNOSIS — G500 Trigeminal neuralgia: Secondary | ICD-10-CM | POA: Diagnosis not present

## 2023-09-16 DIAGNOSIS — R3989 Other symptoms and signs involving the genitourinary system: Secondary | ICD-10-CM

## 2023-09-16 DIAGNOSIS — Z1231 Encounter for screening mammogram for malignant neoplasm of breast: Secondary | ICD-10-CM

## 2023-09-16 DIAGNOSIS — C679 Malignant neoplasm of bladder, unspecified: Secondary | ICD-10-CM

## 2023-09-16 DIAGNOSIS — Z23 Encounter for immunization: Secondary | ICD-10-CM | POA: Diagnosis not present

## 2023-09-16 DIAGNOSIS — R1319 Other dysphagia: Secondary | ICD-10-CM | POA: Diagnosis not present

## 2023-09-16 DIAGNOSIS — R7303 Prediabetes: Secondary | ICD-10-CM

## 2023-09-16 DIAGNOSIS — K219 Gastro-esophageal reflux disease without esophagitis: Secondary | ICD-10-CM

## 2023-09-16 DIAGNOSIS — D6861 Antiphospholipid syndrome: Secondary | ICD-10-CM

## 2023-09-16 LAB — COMPREHENSIVE METABOLIC PANEL WITH GFR
ALT: 10 U/L (ref 0–35)
AST: 13 U/L (ref 0–37)
Albumin: 4 g/dL (ref 3.5–5.2)
Alkaline Phosphatase: 72 U/L (ref 39–117)
BUN: 12 mg/dL (ref 6–23)
CO2: 29 meq/L (ref 19–32)
Calcium: 9.1 mg/dL (ref 8.4–10.5)
Chloride: 106 meq/L (ref 96–112)
Creatinine, Ser: 0.91 mg/dL (ref 0.40–1.20)
GFR: 66.95 mL/min (ref >60.00)
Glucose, Bld: 92 mg/dL (ref 70–99)
Potassium: 4.1 meq/L (ref 3.5–5.1)
Sodium: 141 meq/L (ref 135–145)
Total Bilirubin: 0.5 mg/dL (ref 0.2–1.2)
Total Protein: 7 g/dL (ref 6.0–8.3)

## 2023-09-16 LAB — LIPID PANEL
Cholesterol: 182 mg/dL (ref 0–200)
HDL: 58.2 mg/dL (ref >39.00)
LDL Cholesterol: 101 mg/dL — ABNORMAL HIGH (ref 0–99)
NonHDL: 124.24
Total CHOL/HDL Ratio: 3
Triglycerides: 114 mg/dL (ref 0.0–149.0)
VLDL: 22.8 mg/dL (ref 0.0–40.0)

## 2023-09-16 LAB — HEMOGLOBIN A1C: Hgb A1c MFr Bld: 6 % (ref 4.6–6.5)

## 2023-09-16 NOTE — Assessment & Plan Note (Signed)
 Controlled.  Continue thorough chewing. Continue omeprazole  20 mg daily.

## 2023-09-16 NOTE — Patient Instructions (Signed)
 Stop by the lab prior to leaving today. I will notify you of your results once received.   It was a pleasure to see you today!

## 2023-09-16 NOTE — Assessment & Plan Note (Signed)
 Controlled.  Continue gabapentin  100 mg PRN.

## 2023-09-16 NOTE — Assessment & Plan Note (Signed)
 TDAP provided today. Pap smear UTD. Mammogram scheduled.  Colonoscopy UTD, due 2027  Discussed the importance of a healthy diet and regular exercise in order for weight loss, and to reduce the risk of further co-morbidity.  Exam stable. Labs pending.  Follow up in 1 year for repeat physical.

## 2023-09-16 NOTE — Assessment & Plan Note (Signed)
 Repeat A1C pending.

## 2023-09-16 NOTE — Assessment & Plan Note (Signed)
 Stable.  Following with Coumadin  Clinic.  INR reviewed from April 2025

## 2023-09-16 NOTE — Assessment & Plan Note (Addendum)
 Remission.  Following with Urology, office notes reviewed from January 2025. Follow up in July as scheduled.

## 2023-09-16 NOTE — Progress Notes (Signed)
 Subjective:    Patient ID: Theresa Lewis, female    DOB: 07-05-1960, 63 y.o.   MRN: [id number]  HPI  Theresa Lewis is a very pleasant 63 y.o. female who presents today for complete physical and follow up of chronic conditions.  Immunizations: -Tetanus: Completed in 2014 -Influenza: Completed this  -Shingles: Completed Zostavax -Pneumonia: Completed 2024  Diet: Fair diet.  Exercise: No regular exercise. Plans to start walking soon.  Eye exam: Completes annually  Dental exam: Completed years ago    Pap Smear: Completed in 2024 Mammogram: Completed in April 2024, scheduled for May 2025  Colonoscopy: Completed in 2024, due 2027  BP Readings from Last 3 Encounters:  09/16/23 132/84  06/17/23 (!) 142/89  04/16/23 130/74         Review of Systems  Constitutional:  Negative for unexpected weight change.  HENT:  Negative for rhinorrhea.   Respiratory:  Negative for cough and shortness of breath.   Cardiovascular:  Negative for chest pain.  Gastrointestinal:  Negative for constipation and diarrhea.  Genitourinary:  Negative for difficulty urinating.  Musculoskeletal:  Negative for arthralgias and myalgias.  Skin:  Negative for rash.  Allergic/Immunologic: Negative for environmental allergies.  Neurological:  Negative for dizziness and headaches.  Psychiatric/Behavioral:  The patient is not nervous/anxious.          Past Medical History:  Diagnosis Date   ABNORMAL VAGINAL BLEEDING 04/19/2007   Qualifier: Diagnosis of  By: Bambi Bonine, CMA (AAMA), Cathleen Coach S    Acute cystitis without hematuria 01/31/2020   Acute extremity pain 11/04/2019   Acute pelvic pain, female 12/04/2021   Anemia    h/o as a child   Cancer (HCC)    Complication of anesthesia    stays asleep for a "very long time"   Deep venous thrombosis (HCC)    2006    3 x clotting    GERD (gastroesophageal reflux disease)    HEMORRHOIDS, WITH BLEEDING 12/26/2008   Qualifier: Diagnosis of  By:  Ethel Henry MD, Joaquim Muir    PE (pulmonary embolism) 07/09/2010   Pre-diabetes    Pulmonary embolism (HCC)     Social History   Socioeconomic History   Marital status: Married    Spouse name: Not on file   Number of children: 1   Years of education: Not on file   Highest education level: Associate degree: occupational, Scientist, product/process development, or vocational program  Occupational History   Occupation: student advocate   Tobacco Use   Smoking status: Former    Types: Cigarettes    Passive exposure: Past   Smokeless tobacco: Never  Vaping Use   Vaping status: Never Used  Substance and Sexual Activity   Alcohol use: Not Currently    Comment: rarely   Drug use: No   Sexual activity: Not on file  Other Topics Concern   Not on file  Social History Narrative   HH of 3  Daughter 83 and self.  hh of 2    No pets.   No ets.   No etoh.    Working for [employer] and [employer] .  45- 50 hours per week.                Social Drivers of Health   Financial Resource Strain: Low Risk  (09/15/2023)   Overall Financial Resource Strain (CARDIA)    Difficulty of Paying Living Expenses: Not hard at all  Food Insecurity: No Food Insecurity (09/15/2023)   Hunger Vital Sign  Worried About Programme researcher, broadcasting/film/video in the Last Year: Never true    Ran Out of Food in the Last Year: Never true  Transportation Needs: No Transportation Needs (09/15/2023)   PRAPARE - Administrator, Civil Service (Medical): No    Lack of Transportation (Non-Medical): No  Physical Activity: Unknown (09/15/2023)   Exercise Vital Sign    Days of Exercise per Week: 0 days    Minutes of Exercise per Session: Not on file  Stress: No Stress Concern Present (09/15/2023)   [employer] of Occupational Health - Occupational Stress Questionnaire    Feeling of Stress : Not at all  Social Connections: Socially Integrated (09/15/2023)   Social Connection and Isolation Panel [NHANES]    Frequency of Communication with Friends and Family:  Three times a week    Frequency of Social Gatherings with Friends and Family: Once a week    Attends Religious Services: More than 4 times per year    Active Member of [employer] or Organizations: Yes    Attends Engineer, structural: More than 4 times per year    Marital Status: Married  Catering manager Violence: Not At Risk (07/09/2022)   Humiliation, Afraid, Rape, and Kick questionnaire    Fear of Current or Ex-Partner: No    Emotionally Abused: No    Physically Abused: No    Sexually Abused: No    Past Surgical History:  Procedure Laterality Date   MYOMECTOMY     rectal tear     TRANSURETHRAL RESECTION OF BLADDER TUMOR N/A 04/24/2020   Procedure: TRANSURETHRAL RESECTION OF BLADDER TUMOR (TURBT);  Surgeon: Geraline Knapp, MD;  Location: [facility];  Service: Urology;  Laterality: N/A;   TRANSURETHRAL RESECTION OF BLADDER TUMOR N/A 05/15/2020   Procedure: TRANSURETHRAL RESECTION OF BLADDER TUMOR (TURBT);  Surgeon: Geraline Knapp, MD;  Location: [facility];  Service: Urology;  Laterality: N/A;   TRANSURETHRAL RESECTION OF BLADDER TUMOR N/A 12/04/2020   Procedure: TRANSURETHRAL RESECTION OF BLADDER TUMOR (TURBT);  Surgeon: Geraline Knapp, MD;  Location: [facility];  Service: Urology;  Laterality: N/A;   TUBAL LIGATION     vena caval filter      Family History  Problem Relation Age of Onset   Hypertension Mother    Rheum arthritis Mother    Lung cancer Father 4       april 12    Pancreatic cancer Father 64   Diabetes Paternal Aunt        x2   Colon cancer Neg Hx    Colon polyps Neg Hx    Kidney disease Neg Hx    Esophageal cancer Neg Hx    Gallbladder disease Neg Hx    Rectal cancer Neg Hx    Stomach cancer Neg Hx    Breast cancer Neg Hx     Allergies  Allergen Reactions   Clindamycin/Lincomycin     "unknown"   Tape Other (See Comments)    When pt has a band-aid on, the impression of band-aid stays on for a month    Current Outpatient Medications on File Prior  to Visit  Medication Sig Dispense Refill   diclofenac  Sodium (VOLTAREN  ARTHRITIS PAIN) 1 % GEL Apply 2 g topically 3 (three) times daily as needed. 100 g 1   gabapentin  (NEURONTIN ) 100 MG capsule Take 2 capsules (200 mg total) by mouth 3 (three) times daily. For pain 180 capsule 0   gabapentin  (NEURONTIN ) 300 MG capsule Take 1  capsule (300 mg total) by mouth 3 (three) times daily. For pain. 270 capsule 0   omeprazole  (PRILOSEC) 20 MG capsule Take 1 capsule (20 mg total) by mouth daily. Before a meal for heartburn 90 capsule 3   polyethylene glycol (MIRALAX  / GLYCOLAX ) 17 g packet Take 17 g by mouth daily as needed for moderate constipation. 14 each 5   warfarin (COUMADIN ) 5 MG tablet Take 1 tablet on Monday, Wednesday, Friday; and 1.5 tablet all other days. 120 tablet 0   No current facility-administered medications on file prior to visit.    BP 132/84   Pulse (!) 59   Temp 97.7 F (36.5 C) (Temporal)   Ht 5\' 5"  (1.651 m)   Wt 213 lb (96.6 kg)   SpO2 98%   BMI 35.45 kg/m  Objective:   Physical Exam HENT:     Right Ear: Tympanic membrane and ear canal normal.     Left Ear: Tympanic membrane and ear canal normal.  Eyes:     Pupils: Pupils are equal, round, and reactive to light.  Cardiovascular:     Rate and Rhythm: Normal rate and regular rhythm.  Pulmonary:     Effort: Pulmonary effort is normal.     Breath sounds: Normal breath sounds.  Abdominal:     General: Bowel sounds are normal.     Palpations: Abdomen is soft.     Tenderness: There is no abdominal tenderness.  Musculoskeletal:        General: Normal range of motion.     Cervical back: Neck supple.  Skin:    General: Skin is warm and dry.  Neurological:     Mental Status: She is alert and oriented to person, place, and time.     Cranial Nerves: No cranial nerve deficit.     Deep Tendon Reflexes:     Reflex Scores:      Patellar reflexes are 2+ on the right side and 2+ on the left side. Psychiatric:        Mood  and Affect: Mood normal.           Assessment & Plan:  Preventative health care Assessment & Plan: TDAP provided today. Pap smear UTD. Mammogram scheduled.  Colonoscopy UTD, due 2027  Discussed the importance of a healthy diet and regular exercise in order for weight loss, and to reduce the risk of further co-morbidity.  Exam stable. Labs pending.  Follow up in 1 year for repeat physical.    Gastroesophageal reflux disease, unspecified whether esophagitis present Assessment & Plan: Controlled.  Continue omeprazole  20 mg daily.   Esophageal dysphagia Assessment & Plan: Controlled.  Continue thorough chewing. Continue omeprazole  20 mg daily.   Trigeminal neuralgia Assessment & Plan: Controlled.  Continue gabapentin  100 mg PRN.   Urothelial carcinoma of bladder (HCC) Assessment & Plan: Remission.  Following with Urology, office notes reviewed from January 2025. Follow up in July as scheduled.    Antiphospholipid antibody syndrome (HCC) Assessment & Plan: Stable.  Following with Coumadin  Clinic.  INR reviewed from April 2025   Bladder pain Assessment & Plan: Controlled.  Continue gabapentin  100 mg PRN.   Borderline diabetes Assessment & Plan: Repeat A1C pending.  Orders: -     Comprehensive metabolic panel with GFR -     Lipid panel -     Hemoglobin A1c        Gabriel John, NP

## 2023-09-16 NOTE — Assessment & Plan Note (Signed)
Controlled.   Continue omeprazole 20 mg daily. 

## 2023-09-24 ENCOUNTER — Ambulatory Visit

## 2023-09-24 DIAGNOSIS — Z7901 Long term (current) use of anticoagulants: Secondary | ICD-10-CM

## 2023-09-24 LAB — POCT INR: INR: 3.5 — AB (ref 2.0–3.0)

## 2023-09-24 NOTE — Patient Instructions (Addendum)
 Pre visit review using our clinic review tool, if applicable. No additional management support is needed unless otherwise documented below in the visit note.  Continue 1 1/2 tablets daily except take 1 tablet on Monday, Wednesday and Friday. Recheck in 3 weeks.

## 2023-09-24 NOTE — Progress Notes (Signed)
 Continue 1 1/2 tablets daily except take 1 tablet on Monday, Wednesday and Friday. Recheck in 3 weeks.

## 2023-09-30 ENCOUNTER — Ambulatory Visit
Admission: RE | Admit: 2023-09-30 | Discharge: 2023-09-30 | Disposition: A | Discharge status: Home / Self Care | Source: Ambulatory Visit | Attending: Primary Care | Admitting: Primary Care

## 2023-09-30 DIAGNOSIS — Z1231 Encounter for screening mammogram for malignant neoplasm of breast: Secondary | ICD-10-CM | POA: Diagnosis present

## 2023-10-15 ENCOUNTER — Ambulatory Visit

## 2023-10-15 DIAGNOSIS — Z7901 Long term (current) use of anticoagulants: Secondary | ICD-10-CM | POA: Diagnosis not present

## 2023-10-15 LAB — POCT INR: INR: 2.8 (ref 2.0–3.0)

## 2023-10-15 NOTE — Patient Instructions (Addendum)
 Pre visit review using our clinic review tool, if applicable. No additional management support is needed unless otherwise documented below in the visit note.  Continue 1 1/2 tablets daily except take 1 tablet on Monday, Wednesday and Friday. Recheck in 4 weeks.

## 2023-10-15 NOTE — Progress Notes (Signed)
 Continue 1 1/2 tablets daily except take 1 tablet on Monday, Wednesday and Friday. Recheck in 4 weeks.

## 2023-11-12 ENCOUNTER — Ambulatory Visit

## 2023-11-12 DIAGNOSIS — Z7901 Long term (current) use of anticoagulants: Secondary | ICD-10-CM | POA: Diagnosis not present

## 2023-11-12 LAB — POCT INR: INR: 4.6 — AB (ref 2.0–3.0)

## 2023-11-12 NOTE — Progress Notes (Signed)
 Pt reports being consistent with salads daily but she has been putting dried cranberries, she reports I am not shy with them, on her salads. Cranberries will increase INR. Pt will stop using the dried cranberries. Pt denies any other changes. Pt denies any bleeding. Advised if any s/s of abnormal bruising or bleeding to go to ER. Pt verbalized understanding. Due to the cranberries there is only a small change made to weekly dosing and pt will eliminate them from her diet.  Hold dose today and then change weekly dose to take  1 tablets daily except take 1 1/2 tablet on Monday, Wednesday and Friday. Recheck in 2 weeks.

## 2023-11-12 NOTE — Patient Instructions (Addendum)
 Pre visit review using our clinic review tool, if applicable. No additional management support is needed unless otherwise documented below in the visit note.  Hold dose today and then change weekly dose to take  1 tablets daily except take 1 1/2 tablet on Monday, Wednesday and Friday. Recheck in 2 weeks.

## 2023-11-26 ENCOUNTER — Ambulatory Visit

## 2023-11-26 DIAGNOSIS — Z7901 Long term (current) use of anticoagulants: Secondary | ICD-10-CM

## 2023-11-26 LAB — POCT INR: INR: 2.9 (ref 2.0–3.0)

## 2023-11-26 NOTE — Patient Instructions (Addendum)
 Pre visit review using our clinic review tool, if applicable. No additional management support is needed unless otherwise documented below in the visit note.  Continue 1 tablets daily except take 1 1/2 tablet on Monday, Wednesday and Friday. Recheck in 3 weeks.

## 2023-11-26 NOTE — Progress Notes (Addendum)
 Continue 1 tablets daily except take 1 1/2 tablet on Monday, Wednesday and Friday. Recheck in 3 weeks per pt request due to other events in 4 weeks.

## 2023-11-27 ENCOUNTER — Other Ambulatory Visit: Payer: Self-pay | Admitting: Primary Care

## 2023-11-27 DIAGNOSIS — D6869 Other thrombophilia: Secondary | ICD-10-CM

## 2023-11-27 DIAGNOSIS — Z86718 Personal history of other venous thrombosis and embolism: Secondary | ICD-10-CM

## 2023-12-16 ENCOUNTER — Ambulatory Visit: Payer: Self-pay | Admitting: Urology

## 2023-12-16 ENCOUNTER — Encounter: Payer: Self-pay | Admitting: Urology

## 2023-12-16 VITALS — BP 156/85 | Ht 65.0 in | Wt 214.0 lb

## 2023-12-16 DIAGNOSIS — C679 Malignant neoplasm of bladder, unspecified: Secondary | ICD-10-CM | POA: Diagnosis not present

## 2023-12-16 LAB — URINALYSIS, COMPLETE
Bilirubin, UA: NEGATIVE
Glucose, UA: NEGATIVE
Ketones, UA: NEGATIVE
Leukocytes,UA: NEGATIVE
Nitrite, UA: NEGATIVE
Protein,UA: NEGATIVE
RBC, UA: NEGATIVE
Specific Gravity, UA: 1.03 (ref 1.005–1.030)
Urobilinogen, Ur: 0.2 mg/dL (ref 0.2–1.0)
pH, UA: 6 (ref 5.0–7.5)

## 2023-12-16 LAB — MICROSCOPIC EXAMINATION: Epithelial Cells (non renal): >10 /HPF — AB (ref 0–10)

## 2023-12-16 NOTE — Progress Notes (Signed)
   12/16/23  CC:  Chief Complaint  Patient presents with   Cysto   Urological history: 1. T1 high grade urothelial carcinoma bladder TURBT 03/2020 >6 cm papillary bladder tumor Path T1 high-grade urothelial carcinoma Restaging TURBT 04/2020; no residual tumor 6 week induction BCG completed March 2022 Multifocal recurrence; cystoscopy 10/31/2020 TURBT 11/2020 Ta high-grade UCa Reinduction BCG completed 02/22/2021 3-week maintenance BCG: 10/2021  Underwent 2/3 maintenance BCG January 2024 and developed severe bladder/urethral pain and storage related voiding symptoms which persisted for ~2 months.  She elected to discontinue further BCG treatments Contrast CT (11/2021) - no worrisome findings   HPI: Doing well since last cystoscopy.  No bothersome LUTS or gross hematuria  Blood pressure 142/89, pulse 61, height 5' 5 (1.651 m) NED. A&Ox3.   No respiratory distress   Abd soft, NT, ND Normal external genitalia with patent urethral meatus  Cystoscopy Procedure Note  Patient identification was confirmed, informed consent was obtained, and patient was prepped using Betadine solution.  Lidocaine  jelly was administered per urethral meatus.    Procedure: - Flexible cystoscope introduced, without any difficulty.   - Thorough search of the bladder revealed:    normal urethral meatus    normal urothelium    no stones    no ulcers     no tumors    no urethral polyps    no trabeculation  - Ureteral orifices were normal in position and appearance.  Post-Procedure: - Patient tolerated the procedure well  Assessment/ Plan: No evidence of recurrent bladder tumor Follow-up surveillance cystoscopy 6 months   Theresa JAYSON Barba, MD

## 2023-12-17 ENCOUNTER — Ambulatory Visit (INDEPENDENT_AMBULATORY_CARE_PROVIDER_SITE_OTHER)

## 2023-12-17 DIAGNOSIS — Z7901 Long term (current) use of anticoagulants: Secondary | ICD-10-CM | POA: Diagnosis not present

## 2023-12-17 LAB — POCT INR: INR: 3.2 — AB (ref 2.0–3.0)

## 2023-12-17 NOTE — Progress Notes (Signed)
 Pt reports she had a cystoscopy yesterday and it was all clear.  Continue 1 tablets daily except take 1 1/2 tablet on Monday, Wednesday and Friday. Recheck in 4 weeks.

## 2023-12-17 NOTE — Patient Instructions (Addendum)
 Pre visit review using our clinic review tool, if applicable. No additional management support is needed unless otherwise documented below in the visit note.  Continue 1 tablets daily except take 1 1/2 tablet on Monday, Wednesday and Friday. Recheck in 4 weeks.

## 2024-01-14 ENCOUNTER — Ambulatory Visit

## 2024-01-14 DIAGNOSIS — Z7901 Long term (current) use of anticoagulants: Secondary | ICD-10-CM | POA: Diagnosis not present

## 2024-01-14 LAB — POCT INR: INR: 1.9 — AB (ref 2.0–3.0)

## 2024-01-14 NOTE — Patient Instructions (Addendum)
 Pre visit review using our clinic review tool, if applicable. No additional management support is needed unless otherwise documented below in the visit note.  Increase dose today to take 1 1/2 tablets and increase dose tomorrow to take 2 tablets and then continue 1 tablets daily except take 1 1/2 tablet on Monday, Wednesday and Friday. Recheck in 3 weeks.

## 2024-01-14 NOTE — Progress Notes (Signed)
 Pt missed dose last night. Denies any other changes. Increase dose today to take 1 1/2 tablets and increase dose tomorrow to take 2 tablets and then continue 1 tablets daily except take 1 1/2 tablet on Monday, Wednesday and Friday. Recheck in 3 weeks.

## 2024-02-04 ENCOUNTER — Ambulatory Visit

## 2024-02-04 ENCOUNTER — Telehealth: Payer: Self-pay | Admitting: Primary Care

## 2024-02-04 DIAGNOSIS — Z7901 Long term (current) use of anticoagulants: Secondary | ICD-10-CM | POA: Diagnosis not present

## 2024-02-04 LAB — POCT INR: INR: 3.5 — AB (ref 2.0–3.0)

## 2024-02-04 NOTE — Patient Instructions (Addendum)
 Pre visit review using our clinic review tool, if applicable. No additional management support is needed unless otherwise documented below in the visit note.  Continue 1 tablets daily except take 1 1/2 tablet on Monday, Wednesday and Friday. Recheck in 4 weeks.

## 2024-02-04 NOTE — Telephone Encounter (Signed)
Pt came by and picked up form

## 2024-02-04 NOTE — Progress Notes (Signed)
 Continue 1 tablets daily except take 1 1/2 tablet on Monday, Wednesday and Friday. Recheck in 4 weeks.

## 2024-02-21 ENCOUNTER — Other Ambulatory Visit: Payer: Self-pay | Admitting: Primary Care

## 2024-02-21 DIAGNOSIS — K219 Gastro-esophageal reflux disease without esophagitis: Secondary | ICD-10-CM

## 2024-03-03 ENCOUNTER — Ambulatory Visit

## 2024-03-03 DIAGNOSIS — Z7901 Long term (current) use of anticoagulants: Secondary | ICD-10-CM

## 2024-03-03 LAB — POCT INR: INR: 1.6 — AB (ref 2.0–3.0)

## 2024-03-03 NOTE — Patient Instructions (Addendum)
 Pre visit review using our clinic review tool, if applicable. No additional management support is needed unless otherwise documented below in the visit note.  Increase dose today to take 1 1/2 tablets and increase dose tomorrow to take 2 tablets and then change weekly dose to take 1 1/2 tablets daily except take 1 tablet on Tuesdays. Recheck in 1 weeks.

## 2024-03-03 NOTE — Progress Notes (Signed)
 Pt reports she does not think she missed a dose but is not absolutely sure. She denies any other changes. Increase dose today to take 1 1/2 tablets and increase dose tomorrow to take 2 tablets and then change weekly dose to take 1 1/2 tablets daily except take 1 tablet on Tuesdays. Recheck in 1 weeks.

## 2024-03-10 ENCOUNTER — Ambulatory Visit

## 2024-03-10 DIAGNOSIS — Z7901 Long term (current) use of anticoagulants: Secondary | ICD-10-CM | POA: Diagnosis not present

## 2024-03-10 LAB — POCT INR: INR: 2.7 (ref 2.0–3.0)

## 2024-03-10 NOTE — Patient Instructions (Addendum)
 Pre visit review using our clinic review tool, if applicable. No additional management support is needed unless otherwise documented below in the visit note.  Continue 1 1/2 tablets daily except take 1 tablet on Tuesdays. Recheck in 4 weeks.

## 2024-03-10 NOTE — Progress Notes (Signed)
 Indication: DVT, PE  Continue 1 1/2 tablets daily except take 1 tablet on Tuesdays. Recheck in 4 weeks.

## 2024-04-07 ENCOUNTER — Ambulatory Visit (INDEPENDENT_AMBULATORY_CARE_PROVIDER_SITE_OTHER)

## 2024-04-07 DIAGNOSIS — Z7901 Long term (current) use of anticoagulants: Secondary | ICD-10-CM | POA: Diagnosis not present

## 2024-04-07 LAB — POCT INR: INR: 3.2 — AB (ref 2.0–3.0)

## 2024-04-07 NOTE — Patient Instructions (Addendum)
 Pre visit review using our clinic review tool, if applicable. No additional management support is needed unless otherwise documented below in the visit note.  Continue 1 1/2 tablets daily except take 1 tablet on Tuesdays. Recheck in 4 weeks.

## 2024-04-07 NOTE — Progress Notes (Signed)
 Indication: APS, DVT, PE  Continue 1 1/2 tablets daily except take 1 tablet on Tuesdays. Recheck in 4 weeks.

## 2024-05-05 ENCOUNTER — Ambulatory Visit (INDEPENDENT_AMBULATORY_CARE_PROVIDER_SITE_OTHER)

## 2024-05-05 DIAGNOSIS — Z7901 Long term (current) use of anticoagulants: Secondary | ICD-10-CM | POA: Diagnosis not present

## 2024-05-05 LAB — POCT INR: INR: 2.9 (ref 2.0–3.0)

## 2024-05-05 NOTE — Patient Instructions (Addendum)
 Pre visit review using our clinic review tool, if applicable. No additional management support is needed unless otherwise documented below in the visit note.  Continue 1 1/2 tablets daily except take 1 tablet on Tuesdays. Recheck in 4 weeks.

## 2024-05-05 NOTE — Progress Notes (Signed)
 Indication: APS, DVT, PE  Continue 1 1/2 tablets daily except take 1 tablet on Tuesdays. Recheck in 4 weeks.

## 2024-05-06 ENCOUNTER — Other Ambulatory Visit: Payer: Self-pay | Admitting: Primary Care

## 2024-05-06 DIAGNOSIS — D6869 Other thrombophilia: Secondary | ICD-10-CM

## 2024-05-06 DIAGNOSIS — Z86718 Personal history of other venous thrombosis and embolism: Secondary | ICD-10-CM

## 2024-06-02 ENCOUNTER — Ambulatory Visit

## 2024-06-16 ENCOUNTER — Ambulatory Visit

## 2024-06-16 DIAGNOSIS — Z7901 Long term (current) use of anticoagulants: Secondary | ICD-10-CM | POA: Diagnosis not present

## 2024-06-16 LAB — POCT INR: INR: 3.2 — AB (ref 2.0–3.0)

## 2024-06-16 NOTE — Progress Notes (Signed)
 Indication: APS, DVT, PE  Continue 1 1/2 tablets daily except take 1 tablet on Tuesdays. Recheck in 4 weeks.

## 2024-06-16 NOTE — Patient Instructions (Addendum)
 Pre visit review using our clinic review tool, if applicable. No additional management support is needed unless otherwise documented below in the visit note.  Continue 1 1/2 tablets daily except take 1 tablet on Tuesdays. Recheck in 4 weeks.

## 2024-06-22 ENCOUNTER — Ambulatory Visit: Admitting: Urology

## 2024-06-22 ENCOUNTER — Encounter: Payer: Self-pay | Admitting: Urology

## 2024-06-22 VITALS — BP 156/87 | HR 96 | Ht 65.0 in | Wt 214.0 lb

## 2024-06-22 DIAGNOSIS — C679 Malignant neoplasm of bladder, unspecified: Secondary | ICD-10-CM

## 2024-06-22 DIAGNOSIS — Z8551 Personal history of malignant neoplasm of bladder: Secondary | ICD-10-CM

## 2024-06-22 NOTE — Progress Notes (Signed)
 Patient presents for an office visit. BP today is High. Greater than 140/90. Provider  notified and recheck Blood Pressure 156/87.  Pt advised to talk with PCP.  Pt voiced understanding.

## 2024-06-22 NOTE — Progress Notes (Signed)
" ° °  06/22/24  CC:  Chief Complaint  Patient presents with   Cysto   Urological history: 1. T1 high grade urothelial carcinoma bladder TURBT 03/2020 >6 cm papillary bladder tumor Path T1 high-grade urothelial carcinoma Restaging TURBT 04/2020; no residual tumor 6 week induction BCG completed March 2022 Multifocal recurrence; cystoscopy 10/31/2020 TURBT 11/2020 Ta high-grade UCa Reinduction BCG completed 02/22/2021 3-week maintenance BCG: 10/2021  Underwent 2/3 maintenance BCG January 2024 and developed severe bladder/urethral pain and storage related voiding symptoms which persisted for ~2 months.  She elected to discontinue further BCG treatments Contrast CT (11/2021) - no worrisome findings   HPI: Doing well since last cystoscopy.  No bothersome LUTS or gross hematuria  Blood pressure 142/89, pulse 61, height 5' 5 (1.651 m) NED. A&Ox3.   No respiratory distress   Abd soft, NT, ND Normal external genitalia with patent urethral meatus  Cystoscopy Procedure Note  Patient identification was confirmed, informed consent was obtained, and patient was prepped using Betadine solution.  Lidocaine  jelly was administered per urethral meatus.    Procedure: - Flexible cystoscope introduced, without any difficulty.   - Thorough search of the bladder revealed:    normal urethral meatus    normal urothelium    no stones    no ulcers     no tumors    no urethral polyps    no trabeculation  - Ureteral orifices were normal in position and appearance.  Post-Procedure: - Patient tolerated the procedure well  Assessment/ Plan: No evidence of recurrent bladder tumor Follow-up surveillance cystoscopy 6 months   Glendia JAYSON Barba, MD "

## 2024-07-21 ENCOUNTER — Ambulatory Visit

## 2024-12-20 ENCOUNTER — Other Ambulatory Visit: Admitting: Urology

## 2024-12-21 ENCOUNTER — Other Ambulatory Visit: Admitting: Urology
